# Patient Record
Sex: Female | Born: 1995
Health system: Southern US, Community
[De-identification: ages and names within clinical notes are randomized; demographics above are authoritative.]

## PROBLEM LIST (undated history)

## (undated) ENCOUNTER — Emergency Department (HOSPITAL_COMMUNITY): Payer: Medicaid Other

## (undated) DIAGNOSIS — F909 Attention-deficit hyperactivity disorder, unspecified type: Secondary | ICD-10-CM

## (undated) DIAGNOSIS — F419 Anxiety disorder, unspecified: Secondary | ICD-10-CM

## (undated) DIAGNOSIS — F329 Major depressive disorder, single episode, unspecified: Secondary | ICD-10-CM

## (undated) DIAGNOSIS — A6000 Herpesviral infection of urogenital system, unspecified: Secondary | ICD-10-CM

## (undated) DIAGNOSIS — F32A Depression, unspecified: Secondary | ICD-10-CM

## (undated) DIAGNOSIS — N200 Calculus of kidney: Principal | ICD-10-CM

## (undated) DIAGNOSIS — M1990 Unspecified osteoarthritis, unspecified site: Secondary | ICD-10-CM

## (undated) DIAGNOSIS — Z23 Encounter for immunization: Secondary | ICD-10-CM

## (undated) HISTORY — DX: Unspecified osteoarthritis, unspecified site: M19.90

## (undated) HISTORY — DX: Herpesviral infection of urogenital system, unspecified: A60.00

## (undated) HISTORY — DX: Encounter for immunization: Z23

## (undated) HISTORY — DX: Anxiety disorder, unspecified: F41.9

## (undated) HISTORY — DX: Major depressive disorder, single episode, unspecified: F32.9

## (undated) HISTORY — DX: Calculus of kidney: N20.0

## (undated) HISTORY — DX: Attention-deficit hyperactivity disorder, unspecified type: F90.9

## (undated) HISTORY — DX: Depression, unspecified: F32.A

---
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
## 2017-04-13 ENCOUNTER — Telehealth: Payer: Self-pay

## 2017-04-13 NOTE — Telephone Encounter (Signed)
Pt called triage line stating she has been having itching and burning x 1 week. I advised patient she would need to be seen for her S&S. She states she called and spoke to the receptionist, prior to leaving a triage message, who told her we didn't have an openings today.I explained to her we could not just call in a medication without her being seen. I advised her to go to Urgent care or her PCP for treatment.

## 2017-05-18 ENCOUNTER — Other Ambulatory Visit: Payer: Self-pay | Admitting: Obstetrics and Gynecology

## 2017-07-22 ENCOUNTER — Other Ambulatory Visit: Payer: Self-pay | Admitting: Obstetrics and Gynecology

## 2017-09-12 ENCOUNTER — Other Ambulatory Visit: Payer: Self-pay | Admitting: Obstetrics and Gynecology

## 2017-10-10 ENCOUNTER — Telehealth: Payer: Self-pay | Admitting: Obstetrics and Gynecology

## 2017-10-10 ENCOUNTER — Other Ambulatory Visit: Payer: Self-pay

## 2017-10-10 ENCOUNTER — Other Ambulatory Visit: Payer: Self-pay | Admitting: Obstetrics and Gynecology

## 2017-10-10 MED ORDER — NORGESTIMATE-ETH ESTRADIOL 0.25-35 MG-MCG PO TABS: 1.0000 | ORAL_TABLET | Freq: Every day | ORAL | 0 refills | Status: DC

## 2017-10-10 NOTE — Telephone Encounter (Signed)
Pt is calling needing an refill on her birthcontrol. Pt is schedule 11/20/17 for annual

## 2017-10-31 ENCOUNTER — Ambulatory Visit: Payer: Managed Care, Other (non HMO) | Admitting: Primary Care

## 2017-10-31 ENCOUNTER — Encounter: Payer: Self-pay | Admitting: Primary Care

## 2017-10-31 VITALS — BP 108/60 | HR 75 | Temp 98.2°F | Ht 66.0 in | Wt 137.1 lb

## 2017-10-31 DIAGNOSIS — F329 Major depressive disorder, single episode, unspecified: Secondary | ICD-10-CM

## 2017-10-31 DIAGNOSIS — F419 Anxiety disorder, unspecified: Secondary | ICD-10-CM | POA: Diagnosis not present

## 2017-10-31 MED ORDER — HYDROXYZINE HCL 10 MG PO TABS: 10.0000 mg | ORAL_TABLET | Freq: Two times a day (BID) | ORAL | 0 refills | Status: DC | PRN

## 2017-10-31 MED ORDER — FLUOXETINE HCL 10 MG PO CAPS: 10.0000 mg | ORAL_CAPSULE | Freq: Every day | ORAL | 1 refills | Status: DC

## 2017-10-31 NOTE — Patient Instructions (Signed)
Start fluoxetine 10 mg capsules for anxiety and depression. Take 1 capsule by mouth once daily.   You may take hydroxyzine 10 mg up to twice daily as needed for panic attacks. Use sparingly.  You will be contacted regarding your referral to therapy.  Please let us know if you have not heard back within one week.   Schedule a follow up visit in 6 weeks.   It was a pleasure to see you today!

## 2017-10-31 NOTE — Progress Notes (Signed)
Subjective:    Patient ID: Judy Miller, female    DOB: 1996-11-03, 21 y.o.   MRN: [id number]  HPI  Judy Miller is a 21 year old female with a history of anxiety, depression, ADHD who presents today with a chief complaint of anxiety and depression.  Symptoms include irritability, mood swings, worry with difficulty controlling worry, panic attacks, sadness. Panic attacks occur multiple times weekly, she will hyperventilate and cry on the floor during attacks. Symptoms have been present since age 21, but worse and uncontrollable for the past one year.   GAD 7 score of 19 and PHQ 9 score of 9 today. Previously managed on several different medications at different times but doesn't remember names. She denies SI/HI.   Review of Systems  Constitutional: Negative for fatigue.  Cardiovascular: Positive for palpitations.  Psychiatric/Behavioral: Positive for sleep disturbance. The patient is nervous/anxious.        See HPI       Past Medical History:  Diagnosis Date  . ADHD (attention deficit hyperactivity disorder)   . Anxiety   . Arthritis    history of this in her knees  . Depression      Social History   Socioeconomic History  . Marital status: Single    Spouse name: Not on file  . Number of children: Not on file  . Years of education: Not on file  . Highest education level: Not on file  Social Needs  . Financial resource strain: Not on file  . Food insecurity - worry: Not on file  . Food insecurity - inability: Not on file  . Transportation needs - medical: Not on file  . Transportation needs - non-medical: Not on file  Occupational History  . Not on file  Tobacco Use  . Smoking status: Current Every Day Smoker    Types: E-cigarettes  . Smokeless tobacco: Never Used  Substance and Sexual Activity  . Alcohol use: Yes    Alcohol/week: 0.0 oz    Comment: barely  . Drug use: No    Comment: history of   . Sexual activity: No  Other Topics Concern  . Not on  file  Social History Narrative   Single.   Graduated from HS in 2017.    Student in cosmetology school.   Enjoys spending time outdoors, playing with her dog.    No past surgical history on file.  Family History  Problem Relation Age of Onset  . Arthritis Mother   . Varicose Veins Maternal Grandmother   . Cancer Paternal Grandmother   . Varicose Veins Paternal Grandmother     Allergies  Allergen Reactions  . Cod [employer][Fish Allergy] Hives    Grouper fish  . Other Hives    Red hair dye  . Shellfish Allergy     Crab legs    Current Outpatient Medications on File Prior to Visit  Medication Sig Dispense Refill  . norgestimate-ethinyl estradiol (SPRINTEC 28) 0.25-35 MG-MCG tablet Take 1 tablet daily by mouth. 28 tablet 0   No current facility-administered medications on file prior to visit.     BP 108/60   Pulse 75   Temp 98.2 F (36.8 C) (Oral)   Ht 5\' 6"  (1.676 m)   Wt 137 lb 1.9 oz (62.2 kg)   LMP 10/31/2017   SpO2 99%   BMI 22.13 kg/m     Objective:   Physical Exam  Constitutional: She appears well-nourished.  Neck: Neck supple.  Cardiovascular: Normal rate and  regular rhythm.  Pulmonary/Chest: Effort normal and breath sounds normal.  Skin: Skin is warm and dry.  Psychiatric: She has a normal mood and affect.          Assessment & Plan:

## 2017-10-31 NOTE — Assessment & Plan Note (Signed)
GAD 7 score of 19 and PHQ 9 score of 9 today.  Patient is ready to retry medication as she admits she never really gave it a chance in the past.  Referral placed to therapy as I believe she will benefit. Prescription for fluoxetine 10 mg capsule sent to pharmacy. Prescription for hydroxyzine sent to pharmacy to use as needed for panic attacks.  We will not be prescribing benzodiazepines in a 21 year old.  We discussed possible side effects of headache, GI upset, drowsiness, and SI/HI. If thoughts of SI/HI develop, we discussed to present to the emergency immediately. Patient verbalized understanding.   Follow-up in 6 weeks for reevaluation.

## 2017-11-03 DIAGNOSIS — A6000 Herpesviral infection of urogenital system, unspecified: Secondary | ICD-10-CM

## 2017-11-03 HISTORY — DX: Herpesviral infection of urogenital system, unspecified: A60.00

## 2017-11-09 ENCOUNTER — Telehealth: Payer: Self-pay

## 2017-11-09 ENCOUNTER — Other Ambulatory Visit: Payer: Self-pay

## 2017-11-09 DIAGNOSIS — Z30011 Encounter for initial prescription of contraceptive pills: Secondary | ICD-10-CM

## 2017-11-09 MED ORDER — NORGESTIMATE-ETH ESTRADIOL 0.25-35 MG-MCG PO TABS: 1.0000 | ORAL_TABLET | Freq: Every day | ORAL | 0 refills | Status: DC

## 2017-11-09 NOTE — Telephone Encounter (Signed)
Pt has apt for 11/20/17 but needs a refill of Tri-Sprintec. ZO#[phone]#[phone]

## 2017-11-20 ENCOUNTER — Encounter: Payer: Self-pay | Admitting: Obstetrics and Gynecology

## 2017-11-20 ENCOUNTER — Ambulatory Visit (INDEPENDENT_AMBULATORY_CARE_PROVIDER_SITE_OTHER): Payer: Managed Care, Other (non HMO) | Admitting: Obstetrics and Gynecology

## 2017-11-20 VITALS — BP 118/80 | HR 78 | Ht 66.0 in | Wt 136.0 lb

## 2017-11-20 DIAGNOSIS — Z01419 Encounter for gynecological examination (general) (routine) without abnormal findings: Secondary | ICD-10-CM

## 2017-11-20 DIAGNOSIS — Z3041 Encounter for surveillance of contraceptive pills: Secondary | ICD-10-CM

## 2017-11-20 DIAGNOSIS — Z113 Encounter for screening for infections with a predominantly sexual mode of transmission: Secondary | ICD-10-CM

## 2017-11-20 MED ORDER — NORGESTIMATE-ETH ESTRADIOL 0.25-35 MG-MCG PO TABS: 1.0000 | ORAL_TABLET | Freq: Every day | ORAL | 3 refills | Status: DC

## 2017-11-20 NOTE — Patient Instructions (Signed)
I value your feedback and entrusting us with your care. If you get a [facility] patient survey, I would appreciate you taking the time to let us know about your experience today. Thank you! 

## 2017-11-20 NOTE — Progress Notes (Signed)
PCP:  Doreene Nestlark, Katherine K, NP   Chief Complaint  Patient presents with  . Gynecologic Exam     HPI:      Judy Miller is a 21 y.o. G0P0000 who LMP was Patient's last menstrual period was 10/31/2017., presents today for her annual examination.  Her menses are regular every 28-30 days, lasting 4 days.  Dysmenorrhea none. She does not have intermenstrual bleeding.  Sex activity: not sexually active. Last active a few wks ago, but she and boyfriend just broke up. On OCPs.  Hx of STDs: none  There is no FH of breast cancer. There is no FH of ovarian cancer. The patient does do self-breast exams.  Tobacco use: vapes "a lot" Alcohol use: none No drug use.  Exercise: moderately active  She does get adequate calcium and Vitamin D in her diet.  Gardasil completed.  Past Medical History:  Diagnosis Date  . ADHD (attention deficit hyperactivity disorder)   . Anxiety   . Arthritis    history of this in her knees  . Depression   . Vaccine for human papilloma virus (HPV) types 6, 11, 16, and 18 administered     History reviewed. No pertinent surgical history.  Family History  Problem Relation Age of Onset  . Arthritis Mother   . Varicose Veins Maternal Grandmother   . Cancer Paternal Grandmother   . Varicose Veins Paternal Grandmother     Social History   Socioeconomic History  . Marital status: Single    Spouse name: Not on file  . Number of children: Not on file  . Years of education: Not on file  . Highest education level: Not on file  Social Needs  . Financial resource strain: Not on file  . Food insecurity - worry: Not on file  . Food insecurity - inability: Not on file  . Transportation needs - medical: Not on file  . Transportation needs - non-medical: Not on file  Occupational History  . Not on file  Tobacco Use  . Smoking status: Current Every Day Smoker    Types: E-cigarettes  . Smokeless tobacco: Never Used  Substance and Sexual Activity    . Alcohol use: Yes    Alcohol/week: 0.0 oz    Comment: barely  . Drug use: No    Comment: history of   . Sexual activity: No  Other Topics Concern  . Not on file  Social History Narrative   Single.   Graduated from HS in 2017.    Student in cosmetology school.   Enjoys spending time outdoors, playing with her dog.    Current Meds  Medication Sig  . FLUoxetine (PROZAC) 10 MG capsule Take 1 capsule (10 mg total) by mouth daily.  . norgestimate-ethinyl estradiol (SPRINTEC 28) 0.25-35 MG-MCG tablet Take 1 tablet by mouth daily.  . [DISCONTINUED] norgestimate-ethinyl estradiol (SPRINTEC 28) 0.25-35 MG-MCG tablet Take 1 tablet by mouth daily.     ROS:  Review of Systems  Constitutional: Negative for fatigue, fever and unexpected weight change.  Respiratory: Negative for cough, shortness of breath and wheezing.   Cardiovascular: Negative for chest pain, palpitations and leg swelling.  Gastrointestinal: Negative for blood in stool, constipation, diarrhea, nausea and vomiting.  Endocrine: Negative for cold intolerance, heat intolerance and polyuria.  Genitourinary: Negative for dyspareunia, dysuria, flank pain, frequency, genital sores, hematuria, menstrual problem, pelvic pain, urgency, vaginal bleeding, vaginal discharge and vaginal pain.  Musculoskeletal: Negative for back pain, joint swelling and myalgias.  Skin: Negative for rash.  Neurological: Negative for dizziness, syncope, light-headedness, numbness and headaches.  Hematological: Negative for adenopathy.  Psychiatric/Behavioral: Negative for agitation, confusion, sleep disturbance and suicidal ideas. The patient is not nervous/anxious.      Objective: BP 118/80   Pulse 78   Ht 5\' 6"  (1.676 m)   Wt 136 lb (61.7 kg)   LMP 10/31/2017   BMI 21.95 kg/m    Physical Exam  Constitutional: She is oriented to person, place, and time. She appears well-developed and well-nourished.  Genitourinary: Vagina normal and uterus  normal. There is no rash or tenderness on the right labia. There is no rash or tenderness on the left labia. No erythema or tenderness in the vagina. No vaginal discharge found. Right adnexum does not display mass and does not display tenderness. Left adnexum does not display mass and does not display tenderness. Cervix does not exhibit motion tenderness or polyp. Uterus is not enlarged or tender.  Neck: Normal range of motion. No thyromegaly present.  Cardiovascular: Normal rate, regular rhythm and normal heart sounds.  No murmur heard. Pulmonary/Chest: Effort normal and breath sounds normal. Right breast exhibits no mass, no nipple discharge, no skin change and no tenderness. Left breast exhibits no mass, no nipple discharge, no skin change and no tenderness.  Abdominal: Soft. There is no tenderness. There is no guarding.  Musculoskeletal: Normal range of motion.  Neurological: She is alert and oriented to person, place, and time. No cranial nerve deficit.  Psychiatric: She has a normal mood and affect. Her behavior is normal.  Vitals reviewed.   Assessment/Plan: Encounter for annual routine gynecological examination  Encounter for surveillance of contraceptive pills - OCP RF. - Plan: norgestimate-ethinyl estradiol (SPRINTEC 28) 0.25-35 MG-MCG tablet  Screening for STD (sexually transmitted disease) - Plan: Chlamydia/Gonococcus/Trichomonas, NAA  Meds ordered this encounter  Medications  . norgestimate-ethinyl estradiol (SPRINTEC 28) 0.25-35 MG-MCG tablet    Sig: Take 1 tablet by mouth daily.    Dispense:  84 tablet    Refill:  3    Please have patient contact our office for appointment for further refills. Thank you!             GYN counsel adequate intake of calcium and vitamin D, diet and exercise     F/U  Return in about 1 year (around 11/20/2018).  Montee Tallman B. Eusevio Schriver, PA-C 11/20/2017 1:47 PM

## 2017-11-22 LAB — CHLAMYDIA/GONOCOCCUS/TRICHOMONAS, NAA
Chlamydia by NAA: NEGATIVE
Gonococcus by NAA: NEGATIVE
Trich vag by NAA: NEGATIVE

## 2017-11-28 ENCOUNTER — Ambulatory Visit: Payer: Managed Care, Other (non HMO) | Admitting: Obstetrics and Gynecology

## 2017-11-28 ENCOUNTER — Encounter: Payer: Self-pay | Admitting: Obstetrics and Gynecology

## 2017-11-28 VITALS — BP 104/72 | Ht 66.0 in | Wt 132.0 lb

## 2017-11-28 DIAGNOSIS — R300 Dysuria: Secondary | ICD-10-CM

## 2017-11-28 DIAGNOSIS — N898 Other specified noninflammatory disorders of vagina: Secondary | ICD-10-CM | POA: Diagnosis not present

## 2017-11-28 LAB — POCT URINALYSIS DIPSTICK
Bilirubin, UA: NEGATIVE
GLUCOSE UA: NEGATIVE
KETONES UA: NEGATIVE
Nitrite, UA: NEGATIVE
SPEC GRAV UA: 1.025 (ref 1.010–1.025)
Urobilinogen, UA: 0.2 E.U./dL
pH, UA: 6 (ref 5.0–8.0)

## 2017-11-28 MED ORDER — VALACYCLOVIR HCL 1 G PO TABS
1000.0000 mg | ORAL_TABLET | Freq: Two times a day (BID) | ORAL | 0 refills | Status: AC
Start: 2017-11-28 — End: 2017-12-08

## 2017-11-28 NOTE — Patient Instructions (Signed)
I value your feedback and entrusting us with your care. If you get a [facility] patient survey, I would appreciate you taking the time to let us know about your experience today. Thank you! 

## 2017-11-28 NOTE — Progress Notes (Signed)
Chief Complaint  Patient presents with  . Urinary Tract Infection    vaginal irritation, dizziness    HPI:      [name] is a 21 y.o. G0P0000 who LMP was Patient's last menstrual period was 10/31/2017., presents today for vaginal irritation for the past wk. Pt notes vaginal pain, increased d/c without odor, fevers, dizziness, urinary frequency/urgency, dysuria. No LBP, belly pain. No meds to treat. Has to shower after urination due to pain with wiping. No hx of HSV. Broke up with boyfriend of 3 yrs recently. Had annual 11/20/17 and had neg gon/chlam testing.    Past Medical History:  Diagnosis Date  . ADHD (attention deficit hyperactivity disorder)   . Anxiety   . Arthritis    history of this in her knees  . Depression   . Vaccine for human papilloma virus (HPV) types 6, 11, 16, and 18 administered     History reviewed. No pertinent surgical history.  Family History  Problem Relation Age of Onset  . Arthritis Mother   . Varicose Veins Maternal Grandmother   . Cancer Paternal Grandmother   . Varicose Veins Paternal Grandmother     Social History   Socioeconomic History  . Marital status: Single    Spouse name: Not on file  . Number of children: Not on file  . Years of education: Not on file  . Highest education level: Not on file  Social Needs  . Financial resource strain: Not on file  . Food insecurity - worry: Not on file  . Food insecurity - inability: Not on file  . Transportation needs - medical: Not on file  . Transportation needs - non-medical: Not on file  Occupational History  . Not on file  Tobacco Use  . Smoking status: Current Every Day Smoker    Types: E-cigarettes  . Smokeless tobacco: Never Used  Substance and Sexual Activity  . Alcohol use: Yes    Alcohol/week: 0.0 oz    Comment: barely  . Drug use: No    Comment: history of   . Sexual activity: No  Other Topics Concern  . Not on file  Social History Narrative   Single.     Graduated from HS in 2017.    Student in cosmetology school.   Enjoys spending time outdoors, playing with her dog.     Current Outpatient Medications:  .  norgestimate-ethinyl estradiol (SPRINTEC 28) 0.25-35 MG-MCG tablet, Take 1 tablet by mouth daily., Disp: 84 tablet, Rfl: 3 .  FLUoxetine (PROZAC) 10 MG capsule, Take 1 capsule (10 mg total) by mouth daily. (Patient not taking: Reported on 11/28/2017), Disp: 30 capsule, Rfl: 1 .  hydrOXYzine (ATARAX/VISTARIL) 10 MG tablet, Take 1 tablet (10 mg total) by mouth 2 (two) times daily as needed for anxiety. (Patient not taking: Reported on 11/20/2017), Disp: 30 tablet, Rfl: 0 .  valACYclovir (VALTREX) 1000 MG tablet, Take 1 tablet (1,000 mg total) by mouth 2 (two) times daily for 10 days., Disp: 20 tablet, Rfl: 0   ROS:  Review of Systems  Constitutional: Positive for fever.  Gastrointestinal: Negative for blood in stool, constipation, diarrhea, nausea and vomiting.  Genitourinary: Positive for dysuria, frequency, genital sores, urgency and vaginal pain. Negative for dyspareunia, flank pain, hematuria, vaginal bleeding and vaginal discharge.  Musculoskeletal: Negative for back pain.  Skin: Negative for rash.     OBJECTIVE:   Vitals:  BP 104/72   Ht 5\' 6"  (1.676 m)   Wt 132  lb (59.9 kg)   LMP 10/31/2017   BMI 21.31 kg/m   Physical Exam  Constitutional: She is oriented to person, place, and time and well-developed, well-nourished, and in no distress. Vital signs are normal.  Genitourinary: Vulva exhibits lesion. Vulva exhibits no erythema, no exudate, no rash and no tenderness.  Genitourinary Comments: POST FOURCHETTE WITH PERINEAL AREA WITH ULCERATIVE LESION; PAINFUL TO TOUCH  Lymphadenopathy:       Right: No inguinal adenopathy present.       Left: No inguinal adenopathy present.  Neurological: She is oriented to person, place, and time.  Psychiatric: Memory, affect and judgment normal.  Vitals reviewed.  Results for  orders placed or performed in visit on 11/28/17 (from the past 24 hour(s))  POCT Urinalysis Dipstick     Status: Abnormal   Collection Time: 11/28/17 11:05 AM  Result Value Ref Range   Color, UA yellow    Clarity, UA clear    Glucose, UA negative    Bilirubin, UA negative    Ketones, UA negative    Spec Grav, UA 1.025 1.010 - 1.025   Blood, UA small    pH, UA 6.0 5.0 - 8.0   Protein, UA trace    Urobilinogen, UA 0.2 0.2 or 1.0 E.U./dL   Nitrite, UA negative    Leukocytes, UA Moderate (2+) (A) Negative   Appearance     Odor       Assessment/Plan: Vaginal lesion - Looks herpetic. Rx valtrex. One Swab culture. Will call pt wiht results. HSV etiology/tx/testing discussed.  - Plan: Other/Misc lab test, valACYclovir (VALTREX) 1000 MG tablet  Dysuria - Most likely due lesion. - Plan: POCT Urinalysis Dipstick    Meds ordered this encounter  Medications  . valACYclovir (VALTREX) 1000 MG tablet    Sig: Take 1 tablet (1,000 mg total) by mouth 2 (two) times daily for 10 days.    Dispense:  20 tablet    Refill:  0      Return if symptoms worsen or fail to improve.  Lyndal Reggio B. Mckenley Birenbaum, PA-C 11/28/2017 11:50 AM

## 2017-12-05 ENCOUNTER — Telehealth: Payer: Self-pay | Admitting: Obstetrics and Gynecology

## 2017-12-05 ENCOUNTER — Encounter: Payer: Self-pay | Admitting: Obstetrics and Gynecology

## 2017-12-05 DIAGNOSIS — A6004 Herpesviral vulvovaginitis: Secondary | ICD-10-CM

## 2017-12-05 MED ORDER — VALACYCLOVIR HCL 500 MG PO TABS: 500.0000 mg | ORAL_TABLET | Freq: Every day | ORAL | 11 refills | Status: DC

## 2017-12-05 NOTE — Telephone Encounter (Signed)
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 Lesion improving with valtrex tx. Discussed episodic vs daily Rx. Pt wants daily valtrex. Rx eRxd. Recommend partner does IgG testing. F/u prn.

## 2017-12-14 ENCOUNTER — Ambulatory Visit: Payer: Managed Care, Other (non HMO) | Admitting: Primary Care

## 2017-12-14 ENCOUNTER — Encounter: Payer: Self-pay | Admitting: Primary Care

## 2017-12-14 DIAGNOSIS — F329 Major depressive disorder, single episode, unspecified: Secondary | ICD-10-CM

## 2017-12-14 DIAGNOSIS — F419 Anxiety disorder, unspecified: Secondary | ICD-10-CM | POA: Diagnosis not present

## 2017-12-14 NOTE — Progress Notes (Signed)
Subjective:    Patient ID: Judy Miller, female    DOB: 10-16-1996, 22 y.o.   MRN: [id number]  HPI  [name] is a 22 year old female who presents today for follow up of anxiety and depression.   She last presented on 10/31/17 with complaints of irritability, mood swings, difficulty controlling worry, panic attacks. She's had symptoms since the age of 22 but during her last visit symptoms had progressed. GAD 7 score of 19 and PHQ 9 score of 9 that day so she was initiated on fluoxetine 10 mg and referred to therapy for further treatment.  Since her last visit she's not yet been connected with therapy. She was contacted but forgot to call back to schedule. She's noticed feeling more calm, experiences less panic attacks, slightly less irritability. She denies GI upset, SI/HI. She has noticed feeling drowsy and is taking her medication in the morning. She's not had to take the hydroxyzine within the past one week.   Review of Systems  Respiratory: Negative for shortness of breath.   Cardiovascular: Negative for chest pain.  Gastrointestinal: Negative for abdominal pain.  Psychiatric/Behavioral: Negative for suicidal ideas.       Anxiety improved, see HPI       Past Medical History:  Diagnosis Date  . ADHD (attention deficit hyperactivity disorder)   . Anxiety   . Arthritis    history of this in her knees  . Depression   . Genital herpes 11/2017   pos HSV 2 culture  . Vaccine for human papilloma virus (HPV) types 6, 11, 16, and 18 administered      Social History   Socioeconomic History  . Marital status: Single    Spouse name: Not on file  . Number of children: Not on file  . Years of education: Not on file  . Highest education level: Not on file  Social Needs  . Financial resource strain: Not on file  . Food insecurity - worry: Not on file  . Food insecurity - inability: Not on file  . Transportation needs - medical: Not on file  . Transportation needs -  non-medical: Not on file  Occupational History  . Not on file  Tobacco Use  . Smoking status: Current Every Day Smoker    Types: E-cigarettes  . Smokeless tobacco: Never Used  Substance and Sexual Activity  . Alcohol use: Yes    Alcohol/week: 0.0 oz    Comment: barely  . Drug use: No    Comment: history of   . Sexual activity: No  Other Topics Concern  . Not on file  Social History Narrative   Single.   Graduated from HS in 2017.    Student in cosmetology school.   Enjoys spending time outdoors, playing with her dog.    No past surgical history on file.  Family History  Problem Relation Age of Onset  . Arthritis Mother   . Varicose Veins Maternal Grandmother   . Cancer Paternal Grandmother   . Varicose Veins Paternal Grandmother     Allergies  Allergen Reactions  . Cod [employer][Fish Allergy] Hives    Grouper fish  . Fish-Derived Products Hives    Grouper fish  . Other Hives and Other (See Comments)    Red hair dye Crab legs Grouper fish Red dye-hives/seafood-hives,burning in throat Red hair dye  . Shellfish Allergy Other (See Comments)    Crab legs Crab legs    Current Outpatient Medications on File Prior to  Visit  Medication Sig Dispense Refill  . FLUoxetine (PROZAC) 10 MG capsule Take 1 capsule (10 mg total) by mouth daily. 30 capsule 1  . hydrOXYzine (ATARAX/VISTARIL) 10 MG tablet Take 1 tablet (10 mg total) by mouth 2 (two) times daily as needed for anxiety. 30 tablet 0  . norgestimate-ethinyl estradiol (SPRINTEC 28) 0.25-35 MG-MCG tablet Take 1 tablet by mouth daily. 84 tablet 3  . valACYclovir (VALTREX) 500 MG tablet Take 1 tablet (500 mg total) by mouth daily. 30 tablet 11   No current facility-administered medications on file prior to visit.     BP 100/66   Pulse 80   Temp 97.8 F (36.6 C) (Oral)   Ht 5\' 6"  (1.676 m)   Wt 132 lb 6.4 oz (60.1 kg)   SpO2 99%   BMI 21.37 kg/m    Objective:   Physical Exam  Constitutional: She appears  well-nourished.  Neck: Neck supple.  Cardiovascular: Normal rate and regular rhythm.  Pulmonary/Chest: Effort normal and breath sounds normal.  Skin: Skin is warm and dry.  Psychiatric: She has a normal mood and affect.          Assessment & Plan:

## 2017-12-14 NOTE — Assessment & Plan Note (Signed)
Improved on Fluoxetine 10 mg, continue same. Using hydroxyzine much less frequent now. Discussed drowsiness precautions with hydroxyzine. Will have her switch fluoxetine to PM administration if no improvement in drowsiness.   She will update if symptoms become worse.

## 2017-12-14 NOTE — Patient Instructions (Signed)
Continue fluoxetine 10 mg capsules once daily for anxiety.  Use the hydroxyzine as needed for panic attacks.  Please connect with the therapist as discussed.  It was a pleasure to see you today!

## 2017-12-24 ENCOUNTER — Other Ambulatory Visit: Payer: Self-pay | Admitting: Primary Care

## 2017-12-24 DIAGNOSIS — F419 Anxiety disorder, unspecified: Principal | ICD-10-CM

## 2017-12-24 DIAGNOSIS — F329 Major depressive disorder, single episode, unspecified: Secondary | ICD-10-CM

## 2018-03-07 ENCOUNTER — Other Ambulatory Visit: Payer: Self-pay | Admitting: Primary Care

## 2018-03-07 DIAGNOSIS — F329 Major depressive disorder, single episode, unspecified: Secondary | ICD-10-CM

## 2018-03-07 DIAGNOSIS — F419 Anxiety disorder, unspecified: Principal | ICD-10-CM

## 2018-05-04 ENCOUNTER — Encounter: Payer: Self-pay | Admitting: Obstetrics and Gynecology

## 2018-05-06 ENCOUNTER — Other Ambulatory Visit: Payer: Self-pay

## 2018-05-06 DIAGNOSIS — A6004 Herpesviral vulvovaginitis: Secondary | ICD-10-CM

## 2018-05-06 MED ORDER — VALACYCLOVIR HCL 500 MG PO TABS: 500.0000 mg | ORAL_TABLET | Freq: Every day | ORAL | 8 refills | Status: DC

## 2018-05-21 ENCOUNTER — Other Ambulatory Visit: Payer: Self-pay

## 2018-05-21 ENCOUNTER — Encounter: Payer: Self-pay | Admitting: Obstetrics and Gynecology

## 2018-05-21 DIAGNOSIS — Z3041 Encounter for surveillance of contraceptive pills: Secondary | ICD-10-CM

## 2018-05-21 MED ORDER — NORGESTIMATE-ETH ESTRADIOL 0.25-35 MG-MCG PO TABS: 1.0000 | ORAL_TABLET | Freq: Every day | ORAL | 3 refills | Status: DC

## 2018-05-21 NOTE — Telephone Encounter (Signed)
Pls send Rx to new pharm. Thx

## 2018-12-08 ENCOUNTER — Other Ambulatory Visit: Payer: Self-pay | Admitting: Obstetrics and Gynecology

## 2018-12-08 DIAGNOSIS — A6004 Herpesviral vulvovaginitis: Secondary | ICD-10-CM

## 2019-01-15 ENCOUNTER — Other Ambulatory Visit: Payer: Self-pay

## 2019-01-15 ENCOUNTER — Emergency Department: Payer: Managed Care, Other (non HMO)

## 2019-01-15 ENCOUNTER — Emergency Department
Admission: EM | Admit: 2019-01-15 | Discharge: 2019-01-15 | Disposition: A | Discharge status: Home / Self Care | Payer: Managed Care, Other (non HMO) | Attending: Emergency Medicine | Admitting: Emergency Medicine

## 2019-01-15 DIAGNOSIS — F17290 Nicotine dependence, other tobacco product, uncomplicated: Secondary | ICD-10-CM | POA: Diagnosis not present

## 2019-01-15 DIAGNOSIS — N200 Calculus of kidney: Secondary | ICD-10-CM | POA: Diagnosis not present

## 2019-01-15 DIAGNOSIS — R1030 Lower abdominal pain, unspecified: Secondary | ICD-10-CM | POA: Diagnosis present

## 2019-01-15 DIAGNOSIS — Z79899 Other long term (current) drug therapy: Secondary | ICD-10-CM | POA: Insufficient documentation

## 2019-01-15 LAB — COMPREHENSIVE METABOLIC PANEL
ALT: 15 U/L (ref 0–44)
AST: 16 U/L (ref 15–41)
Albumin: 3.8 g/dL (ref 3.5–5.0)
Alkaline Phosphatase: 43 U/L (ref 38–126)
Anion gap: 7 (ref 5–15)
BUN: 13 mg/dL (ref 6–20)
CHLORIDE: 105 mmol/L (ref 98–111)
CO2: 25 mmol/L (ref 22–32)
Calcium: 9 mg/dL (ref 8.9–10.3)
Creatinine, Ser: 0.68 mg/dL (ref 0.44–1.00)
GFR calc Af Amer: >60 mL/min (ref >60)
GFR calc non Af Amer: >60 mL/min (ref >60)
Glucose, Bld: 125 mg/dL — ABNORMAL HIGH (ref 70–99)
Potassium: 3.7 mmol/L (ref 3.5–5.1)
Sodium: 137 mmol/L (ref 135–145)
Total Bilirubin: 0.6 mg/dL (ref 0.3–1.2)
Total Protein: 7.3 g/dL (ref 6.5–8.1)

## 2019-01-15 LAB — LIPASE, BLOOD: LIPASE: 29 U/L (ref 11–51)

## 2019-01-15 LAB — URINALYSIS, COMPLETE (UACMP) WITH MICROSCOPIC
GLUCOSE, UA: NEGATIVE mg/dL
KETONES UR: 20 mg/dL — AB
Leukocytes,Ua: NEGATIVE
NITRITE: NEGATIVE
PH: 5 (ref 5.0–8.0)
PROTEIN: NEGATIVE mg/dL
Specific Gravity, Urine: 1.025 (ref 1.005–1.030)

## 2019-01-15 LAB — CBC
HCT: 39.3 % (ref 36.0–46.0)
Hemoglobin: 12.9 g/dL (ref 12.0–15.0)
MCH: 28.6 pg (ref 26.0–34.0)
MCHC: 32.8 g/dL (ref 30.0–36.0)
MCV: 87.1 fL (ref 80.0–100.0)
PLATELETS: 308 10*3/uL (ref 150–400)
RBC: 4.51 MIL/uL (ref 3.87–5.11)
RDW: 12.3 % (ref 11.5–15.5)
WBC: 16.8 10*3/uL — AB (ref 4.0–10.5)
nRBC: 0 % (ref 0.0–0.2)

## 2019-01-15 LAB — POCT PREGNANCY, URINE: Preg Test, Ur: NEGATIVE

## 2019-01-15 MED ORDER — TAMSULOSIN HCL 0.4 MG PO CAPS: 0.4000 mg | ORAL_CAPSULE | Freq: Every day | ORAL | 0 refills | Status: DC

## 2019-01-15 MED ORDER — KETOROLAC TROMETHAMINE 10 MG PO TABS: 10.0000 mg | ORAL_TABLET | Freq: Four times a day (QID) | ORAL | 0 refills | Status: DC | PRN

## 2019-01-15 MED ORDER — ONDANSETRON 4 MG PO TBDP: 4.0000 mg | ORAL_TABLET | Freq: Three times a day (TID) | ORAL | 0 refills | Status: DC | PRN

## 2019-01-15 MED ORDER — KETOROLAC TROMETHAMINE 30 MG/ML IJ SOLN
30.0000 mg | Freq: Once | INTRAMUSCULAR | Status: AC
  Administered 2019-01-15: 30 mg via INTRAVENOUS
  Filled 2019-01-15: qty 1

## 2019-01-15 MED ORDER — FENTANYL CITRATE (PF) 100 MCG/2ML IJ SOLN
50.0000 ug | Freq: Once | INTRAMUSCULAR | Status: AC
  Administered 2019-01-15: 50 ug via INTRAVENOUS
  Filled 2019-01-15: qty 2

## 2019-01-15 MED ORDER — ONDANSETRON HCL 4 MG/2ML IJ SOLN
4.0000 mg | Freq: Once | INTRAMUSCULAR | Status: AC
  Administered 2019-01-15: 4 mg via INTRAVENOUS
  Filled 2019-01-15: qty 2

## 2019-01-15 MED ORDER — SODIUM CHLORIDE 0.9 % IV BOLUS
1000.0000 mL | Freq: Once | INTRAVENOUS | Status: AC
  Administered 2019-01-15: 1000 mL via INTRAVENOUS

## 2019-01-15 NOTE — ED Triage Notes (Signed)
Patient ambulatory to triage with steady gait, without difficulty or distress noted; pt reports awoke at 3am with lower back/abd pain accomp by urinary frequency

## 2019-01-15 NOTE — ED Provider Notes (Signed)
[facility] Emergency Department Provider Note  Time seen: 7:22 AM  I have reviewed the triage vital signs and the nursing notes.   HISTORY  Chief Complaint Back Pain    HPI Judy Miller is a 23 y.o. female with past medical history of ADHD, anxiety, presents to the emergency department for lower abdominal and lower back pain.  According to the patient she was awoken overnight with significant lower back pain as well as suprapubic pain, urinary frequency and dysuria.  No history of UTIs per patient.  Family history of kidney stones but no personal history of kidney stones.  Denies any known fever.  Denies any increased vaginal discharge.  Last menstrual period was 2 to 3 weeks ago.  Patient does feel somewhat nauseated as well as dizzy, mild diaphoresis at this time.   Past Medical History:  Diagnosis Date  . ADHD (attention deficit hyperactivity disorder)   . Anxiety   . Arthritis    history of this in her knees  . Depression   . Genital herpes 11/2017   pos HSV 2 culture  . Vaccine for human papilloma virus (HPV) types 6, 11, 16, and 18 administered     Patient Active Problem List   Diagnosis Date Noted  . Annual physical exam 11/10/2015  . Rhinitis, allergic 11/10/2015  . Acne vulgaris 05/12/2015  . Anxiety and depression 05/12/2015  . Generalized anxiety disorder 11/06/2013  . Depression, major, single episode, mild (HCC) 04/23/2013  . ADHD (attention deficit hyperactivity disorder)     No past surgical history on file.  Prior to Admission medications   Medication Sig Start Date End Date Taking? Authorizing Provider  FLUoxetine (PROZAC) 10 MG capsule TAKE 1 CAPSULE BY MOUTH EVERY DAY 03/10/18   Doreene Nest, NP  hydrOXYzine (ATARAX/VISTARIL) 10 MG tablet Take 1 tablet (10 mg total) by mouth 2 (two) times daily as needed for anxiety. 10/31/17   Doreene Nest, NP  norgestimate-ethinyl estradiol (SPRINTEC 28) 0.25-35 MG-MCG  tablet Take 1 tablet by mouth daily. 05/21/18   Copland, Ilona Sorrel, PA-C  valACYclovir (VALTREX) 500 MG tablet Take 1 tablet (500 mg total) by mouth daily. 05/06/18   Copland, Ilona Sorrel, PA-C    Allergies  Allergen Reactions  . Cod [employer] Allergy] Hives    Grouper fish  . Fish-Derived Products Hives    Grouper fish  . Other Hives and Other (See Comments)    Red hair dye Crab legs Grouper fish Red dye-hives/seafood-hives,burning in throat Red hair dye  . Shellfish Allergy Other (See Comments)    Crab legs Crab legs    Family History  Problem Relation Age of Onset  . Arthritis Mother   . Varicose Veins Maternal Grandmother   . Cancer Paternal Grandmother   . Varicose Veins Paternal Grandmother     Social History Social History   Tobacco Use  . Smoking status: Current Every Day Smoker    Types: E-cigarettes  . Smokeless tobacco: Never Used  Substance Use Topics  . Alcohol use: Yes    Alcohol/week: 0.0 standard drinks    Comment: barely  . Drug use: No    Comment: history of     Review of Systems Constitutional: No known fever Cardiovascular: Negative for chest pain. Respiratory: Negative for shortness of breath. Gastrointestinal: Positive for lower abdominal pain.  Positive for lower back pain.  Positive for nausea.  Negative for vomiting or diarrhea. Genitourinary: Positive for dysuria and frequency.  Negative for vaginal  bleeding.  Denies any increased discharge. Musculoskeletal: Negative for musculoskeletal complaints Skin: Negative for skin complaints  Neurological: Negative for headache All other ROS negative  ____________________________________________   PHYSICAL EXAM:  VITAL SIGNS: ED Triage Vitals  Enc Vitals Group     BP 01/15/19 0650 (!) 142/92     Pulse Rate 01/15/19 0650 71     Resp 01/15/19 0650 18     Temp 01/15/19 0650 98 F (36.7 C)     Temp Source 01/15/19 0650 Oral     SpO2 01/15/19 0650 98 %     Weight 01/15/19 0649 135 lb (61.2 kg)      Height 01/15/19 0649 5\' 6"  (1.676 m)     Head Circumference --      Peak Flow --      Pain Score 01/15/19 0649 10     Pain Loc --      Pain Edu? --      Excl. in GC? --    Constitutional: Alert and oriented. Well appearing and in no distress. Eyes: Normal exam ENT   Head: Normocephalic and atraumatic.   Mouth/Throat: Mucous membranes are moist. Cardiovascular: Normal rate, regular rhythm. No murmur Respiratory: Normal respiratory effort without tachypnea nor retractions. Breath sounds are clear Gastrointestinal: Soft and nontender. No distention.   Musculoskeletal: Nontender with normal range of motion in all extremities.  Neurologic:  Normal speech and language. No gross focal neurologic deficits  Skin:  Skin is warm, dry and intact.  Psychiatric: Mood and affect are normal.   ____________________________________________   RADIOLOGY  IMPRESSION: 1. 2 mm calculus distal right ureter with moderate hydronephrosis on the right. Right kidney is edematous.  2. Slight nephrocalcinosis.  3. No evident bowel obstruction. No abscess in the abdomen or pelvis. Appendix region appears normal.  ____________________________________________   INITIAL IMPRESSION / ASSESSMENT AND PLAN / ED COURSE  Pertinent labs & imaging results that were available during my care of the patient were reviewed by me and considered in my medical decision making (see chart for details).  Patient presents to the emergency department for lower abdominal and lower back pain.  Differential at this time include urinary tract infection, pyelonephritis, PID, intra-abdominal infection such as appendicitis.  Patient describes her pain is 10/10 aching pain across the lower back and abdomen.  Given her discomfort we will also check lab work, treat her pain and nausea, IV hydrate while awaiting results.  Urine pregnancy test is negative remainder the lab work including urinalysis is pending.  Patient's labs have  resulted showing hematuria and moderate leukocytosis otherwise negative.  Patient's CT scan shows a 2 mm distal right ureteral stone likely the cause of the patient's discomfort.  Patient feels much better after fentanyl and Toradol.  We will discharge with Toradol, Flomax and urology follow-up.  I discussed my normal kidney stone return precautions. ____________________________________________   FINAL CLINICAL IMPRESSION(S) / ED DIAGNOSES  Abdominal pain Lower back pain Kidney stone   Minna AntisPaduchowski, Brynlie Daza, MD 01/15/19 [phone]

## 2019-01-17 DIAGNOSIS — N200 Calculus of kidney: Secondary | ICD-10-CM

## 2019-01-17 LAB — URINE CULTURE

## 2019-01-17 MED ORDER — TRAMADOL HCL 50 MG PO TABS: 50.0000 mg | ORAL_TABLET | Freq: Three times a day (TID) | ORAL | 0 refills | Status: DC | PRN

## 2019-01-21 ENCOUNTER — Encounter: Payer: Self-pay | Admitting: Primary Care

## 2019-01-21 ENCOUNTER — Ambulatory Visit (INDEPENDENT_AMBULATORY_CARE_PROVIDER_SITE_OTHER): Payer: Managed Care, Other (non HMO) | Admitting: Primary Care

## 2019-01-21 VITALS — BP 122/76 | HR 78 | Temp 98.4°F | Ht 66.0 in | Wt 125.2 lb

## 2019-01-21 DIAGNOSIS — F909 Attention-deficit hyperactivity disorder, unspecified type: Secondary | ICD-10-CM

## 2019-01-21 DIAGNOSIS — N200 Calculus of kidney: Secondary | ICD-10-CM | POA: Diagnosis not present

## 2019-01-21 DIAGNOSIS — Z79899 Other long term (current) drug therapy: Secondary | ICD-10-CM

## 2019-01-21 HISTORY — DX: Calculus of kidney: N20.0

## 2019-01-21 LAB — POC URINALSYSI DIPSTICK (AUTOMATED)
Bilirubin, UA: NEGATIVE
Glucose, UA: NEGATIVE
Ketones, UA: NEGATIVE
Leukocytes, UA: NEGATIVE
Nitrite, UA: NEGATIVE
Protein, UA: POSITIVE — AB
Spec Grav, UA: 1.015 (ref 1.010–1.025)
Urobilinogen, UA: 0.2 E.U./dL
pH, UA: 6 (ref 5.0–8.0)

## 2019-01-21 LAB — BASIC METABOLIC PANEL
BUN: 13 mg/dL (ref 6–23)
CO2: 29 mEq/L (ref 19–32)
Calcium: 9.2 mg/dL (ref 8.4–10.5)
Chloride: 98 mEq/L (ref 96–112)
Creatinine, Ser: 0.75 mg/dL (ref 0.40–1.20)
GFR: 97.72 mL/min (ref >60.00)
GLUCOSE: 78 mg/dL (ref 70–99)
Potassium: 3.6 mEq/L (ref 3.5–5.1)
Sodium: 135 mEq/L (ref 135–145)

## 2019-01-21 LAB — CBC
HCT: 37.2 % (ref 36.0–46.0)
Hemoglobin: 12.3 g/dL (ref 12.0–15.0)
MCHC: 33 g/dL (ref 30.0–36.0)
MCV: 86.4 fl (ref 78.0–100.0)
Platelets: 279 10*3/uL (ref 150.0–400.0)
RBC: 4.31 Mil/uL (ref 3.87–5.11)
RDW: 12.8 % (ref 11.5–14.6)
WBC: 8.3 10*3/uL (ref 4.5–10.5)

## 2019-01-21 MED ORDER — LISDEXAMFETAMINE DIMESYLATE 20 MG PO CAPS: 20.0000 mg | ORAL_CAPSULE | Freq: Every day | ORAL | 0 refills | Status: DC

## 2019-01-21 NOTE — Assessment & Plan Note (Signed)
Deteriorated. Reviewed records.  Agree to resume Vyvanse for short period of time until she's finished with school. UDS will be due in one month.  Controlled substance contract updated today. She will update.

## 2019-01-21 NOTE — Patient Instructions (Signed)
Continue taking the tamsulosin (Flomax) daily to help facilitation of the kidney stone.  Use the Tramadol as needed, you can double up on the dose once daily as discussed.  We've resumed your Vyvanse 20 mg. You will need to return anytime next month (before your refill is due) for a urine drug screen.   Continue to drink plenty of water, update me as discussed.  Stop by the lab prior to leaving today. I will notify you of your results once received.   It was a pleasure to see you today!

## 2019-01-21 NOTE — Assessment & Plan Note (Signed)
Right 2 mm ureteral stone, diagnosed on 01/15/19. Continue Flomax and Tramadol. Discussed to use the strainer daily, return stone if possible. She will update if symptoms persist.  All hospital notes, labs imaging reviewed.

## 2019-01-21 NOTE — Progress Notes (Signed)
Subjective:    Patient ID: Judy Miller, female    DOB: 26-Mar-1996, 23 y.o.   MRN: [id number]  HPI  Judy Miller is a 23 year old female who presents today for emergency department follow up. She would also like to discuss her ADHD.  1) Emergency Department Follow up/Renal Stone:  She was evaluated on 01/15/19 at [facility] ED with a chief complaint of lower abdominal, suprapubic, and back pain that began several hours prior. UA with moderate leukocytosis and hematuria. She underwent CT abdomen/pelvic which revealed a 2 mm calculus to the distal right ureter with moderate hydronephrosis on the right. Right kidney was edematous. She was treated with IV Fentanyl and Toradol and improved. She was discharged home with prescriptions for Flomax and Toradol and was told to follow up with Urology.  Since her last visit she's continued to experience pain. She sent a message through the online portal on 01/17/19 with reports of continued pain. She endorsed compliance to her prescribed medications. She was provided with a short term course of Tramadol to help with discomfort.  Today she's feeling slightly better. She does have some achy sensation to her right side with radiation to right suprapubic region. She doesn't feel as though her pain has moved. She did have some nausea with vomiting with her last episode being two days ago. She endorses drinking plenty of water, also taking tamsulosin daily. She sometimes uses the strainer. She's had no relief from Tramadol, she is taking 50 mg three times daily, also Toradol.   She is needing work/school notes for being out. She missed 02/14, 02/16, and today due to an appointment.   2) ADHD: She would also like to discuss her lack of focus. Prior history of ADHD and was once managed on Vyvanse 20 mg. She was able to come off after high school and has been doing well since except for recently. She is working two jobs and going to school part time. She's having  a hard time focusing and completing school work. She often forgets to do things at work. She will be done with school in 3-4 months and would like to resume her Vyvanse until that time. She did very well on the 20 mg dose.   Review of Systems  Constitutional: Negative for fever.  Gastrointestinal: Positive for abdominal pain. Negative for nausea and vomiting.  Genitourinary: Positive for flank pain and pelvic pain. Negative for dysuria, frequency and hematuria.  Psychiatric/Behavioral: Positive for decreased concentration.       See HPI       Past Medical History:  Diagnosis Date  . ADHD (attention deficit hyperactivity disorder)   . Anxiety   . Arthritis    history of this in her knees  . Depression   . Genital herpes 11/2017   pos HSV 2 culture  . Vaccine for human papilloma virus (HPV) types 6, 11, 16, and 18 administered      Social History   Socioeconomic History  . Marital status: Single    Spouse name: Not on file  . Number of children: Not on file  . Years of education: Not on file  . Highest education level: Not on file  Occupational History  . Not on file  Social Needs  . Financial resource strain: Not on file  . Food insecurity:    Worry: Not on file    Inability: Not on file  . Transportation needs:    Medical: Not on file  Non-medical: Not on file  Tobacco Use  . Smoking status: Current Every Day Smoker    Types: E-cigarettes  . Smokeless tobacco: Never Used  Substance and Sexual Activity  . Alcohol use: Yes    Alcohol/week: 0.0 standard drinks    Comment: barely  . Drug use: No    Comment: history of   . Sexual activity: Never  Lifestyle  . Physical activity:    Days per week: Not on file    Minutes per session: Not on file  . Stress: Not on file  Relationships  . Social connections:    Talks on phone: Not on file    Gets together: Not on file    Attends religious service: Not on file    Active member of club or organization: Not on file      Attends meetings of clubs or organizations: Not on file    Relationship status: Not on file  . Intimate partner violence:    Fear of current or ex partner: Not on file    Emotionally abused: Not on file    Physically abused: Not on file    Forced sexual activity: Not on file  Other Topics Concern  . Not on file  Social History Narrative   Single.   Graduated from HS in 2017.    Student in cosmetology school.   Enjoys spending time outdoors, playing with her dog.    No past surgical history on file.  Family History  Problem Relation Age of Onset  . Arthritis Mother   . Varicose Veins Maternal Grandmother   . Cancer Paternal Grandmother   . Varicose Veins Paternal Grandmother     Allergies  Allergen Reactions  . Cod [employer] Allergy] Hives    Grouper fish  . Fish-Derived Products Hives    Grouper fish  . Other Hives and Other (See Comments)    Red hair dye Crab legs Grouper fish Red dye-hives/seafood-hives,burning in throat Red hair dye  . Shellfish Allergy Other (See Comments)    Crab legs Crab legs    Current Outpatient Medications on File Prior to Visit  Medication Sig Dispense Refill  . FLUoxetine (PROZAC) 10 MG capsule TAKE 1 CAPSULE BY MOUTH EVERY DAY 90 capsule 1  . hydrOXYzine (ATARAX/VISTARIL) 10 MG tablet Take 1 tablet (10 mg total) by mouth 2 (two) times daily as needed for anxiety. 30 tablet 0  . ketorolac (TORADOL) 10 MG tablet Take 1 tablet (10 mg total) by mouth every 6 (six) hours as needed. 20 tablet 0  . norgestimate-ethinyl estradiol (SPRINTEC 28) 0.25-35 MG-MCG tablet Take 1 tablet by mouth daily. 84 tablet 3  . tamsulosin (FLOMAX) 0.4 MG CAPS capsule Take 1 capsule (0.4 mg total) by mouth daily. 30 capsule 0  . traMADol (ULTRAM) 50 MG tablet Take 1 tablet (50 mg total) by mouth every 8 (eight) hours as needed for severe pain. 15 tablet 0  . valACYclovir (VALTREX) 500 MG tablet Take 1 tablet (500 mg total) by mouth daily. 30 tablet 8  .  ondansetron (ZOFRAN ODT) 4 MG disintegrating tablet Take 1 tablet (4 mg total) by mouth every 8 (eight) hours as needed for nausea or vomiting. (Patient not taking: Reported on 01/21/2019) 20 tablet 0   No current facility-administered medications on file prior to visit.     BP 122/76   Pulse 78   Temp 98.4 F (36.9 C) (Oral)   Ht 5\' 6"  (1.676 m)   Wt 125 lb 4 oz (  56.8 kg)   LMP 01/01/2019 (Approximate)   SpO2 98%   BMI 20.22 kg/m    Objective:   Physical Exam  Constitutional: She appears well-nourished.  Cardiovascular: Normal rate and regular rhythm.  Respiratory: Effort normal and breath sounds normal.  GI: Soft. Normal appearance and bowel sounds are normal. There is abdominal tenderness in the right lower quadrant and suprapubic area. There is CVA tenderness.  Skin: Skin is warm and dry.  Psychiatric: She has a normal mood and affect.           Assessment & Plan:

## 2019-01-29 DIAGNOSIS — F909 Attention-deficit hyperactivity disorder, unspecified type: Secondary | ICD-10-CM

## 2019-01-29 MED ORDER — AMPHETAMINE-DEXTROAMPHET ER 10 MG PO CP24: 10.0000 mg | ORAL_CAPSULE | Freq: Every day | ORAL | 0 refills | Status: DC

## 2019-02-17 ENCOUNTER — Other Ambulatory Visit: Payer: Managed Care, Other (non HMO)

## 2019-07-12 ENCOUNTER — Other Ambulatory Visit: Payer: Self-pay | Admitting: Obstetrics and Gynecology

## 2019-07-12 DIAGNOSIS — Z3041 Encounter for surveillance of contraceptive pills: Secondary | ICD-10-CM

## 2019-10-06 ENCOUNTER — Other Ambulatory Visit: Payer: Self-pay | Admitting: Obstetrics and Gynecology

## 2019-10-06 ENCOUNTER — Telehealth: Payer: Managed Care, Other (non HMO) | Admitting: Family

## 2019-10-06 DIAGNOSIS — L700 Acne vulgaris: Secondary | ICD-10-CM | POA: Diagnosis not present

## 2019-10-06 DIAGNOSIS — Z3041 Encounter for surveillance of contraceptive pills: Secondary | ICD-10-CM

## 2019-10-06 MED ORDER — CLINDAMYCIN PHOS-BENZOYL PEROX 1-5 % EX GEL: Freq: Two times a day (BID) | CUTANEOUS | 0 refills | Status: DC

## 2019-10-06 NOTE — Progress Notes (Signed)
We are sorry that you are experiencing this issue.  Here is how we plan to help!  Based on what you shared with me it looks like you have uncomplicated acne.  Acne is a disorder of the hair follicles and oil glands (sebaceous glands). The sebaceous glands secrete oils to keep the skin moist.  When the glands get clogged, it can lead to pimples or cysts.  These cysts may become infected and leave scars. Acne is very common and normally occurs at puberty.  Acne is also inherited.  Your personal care plan consists of the following recommendations:  I recommend that you use a daily cleanser  You might try an over the counter cleanser that has benzoyl peroxide.  I recommend that you start with a product that has 2.5% benzoyl peroxide.  Stronger concentrations have not been shown to be more effective.  I have prescribed a topical gel with an antibiotic:  Clindamycin-benzoyl peroxide gel.  This gel should be applied to the affected areas twice a day.  Be sure to read the package insert to understand potential side effects.   If excessive dryness or peeling occurs, reduce dose frequency or concentration of the topical scrubs.  If excessive stinging or burning occurs, remove the topical gel with mild soap and water and resume at a lower dose the next day.  Remember oral antibiotics and topical acne treatments may increase your sensitivity to the sun!  HOME CARE:  Do not squeeze pimples because that can often lead to infections, worse acne, and scars.  Use a moisturizer that contains retinoid or fruit acids that may inhibit the development of new acne lesions.  Although there is not a clear link that foods can cause acne, doctors do believe that too many sweets predispose you to skin problems.  GET HELP RIGHT AWAY IF:  If your acne gets worse or is not better within 10 days.  If you become depressed.  If you become pregnant, discontinue medications and call your OB/GYN.  MAKE SURE  YOU:  Understand these instructions.  Will watch your condition.  Will get help right away if you are not doing well or get worse.   Your e-visit answers were reviewed by a board certified advanced clinical practitioner to complete your personal care plan.  Depending upon the condition, your plan could have included both over the counter or prescription medications.  Please review your pharmacy choice.  If there is a problem, you may contact your provider through MyChart messaging and have the prescription routed to another pharmacy.  Your safety is important to us.  If you have drug allergies check your prescription carefully.  For the next 24 hours you can use MyChart to ask questions about today's visit, request a non-urgent call back, or ask for a work or school excuse from your e-visit provider.  You will get an email in the next two days asking about your experience. I hope that your e-visit has been valuable and will speed your recovery.   Approximately 5 minutes was spent documenting and reviewing patient's chart.    

## 2020-09-28 DIAGNOSIS — Z20822 Contact with and (suspected) exposure to covid-19: Secondary | ICD-10-CM | POA: Diagnosis not present

## 2020-09-28 NOTE — Telephone Encounter (Signed)
[facility] Day - Client TELEPHONE ADVICE RECORD AccessNurse Patient Name: Judy Miller Gender: Female DOB: [birth year] Age: 24 Y 6 M Return Phone Number: [phone] (Primary) Address: City/State/Zip: [place] [address] Client [facility] Day - Client Client Site  [facility] - Day Physician Vernona Rieger - NP Contact Type Call Who Is Calling Patient / Member / Family / Caregiver Call Type Triage / Clinical Relationship To Patient Self Return Phone Number [phone] (Primary) Chief Complaint BREATHING - shortness of breath or sounds breathless Reason for Call Symptomatic / Request for Health Information Initial Comment Caller states she found black mold in a closet she does not use. She did clean the mold out and is now having chest pain and SOB. Translation No Nurse Assessment Nurse: Alexander Mt, RN, Nicholaus Bloom Date/Time (Eastern Time): 09/28/2020 10:15:51 AM Confirm and document reason for call. If symptomatic, describe symptoms. ---Caller states she found black mold in a closet she does not use. She did clean the closest out and is now having chest pain and SOB. States symptoms started yesterday and are still present today. Chest pain is in the middle of the chest and is achy and hurts with taking a deep breath. Does the patient have any new or worsening symptoms? ---Yes Will a triage be completed? ---Yes Related visit to physician within the last 2 weeks? ---No Does the PT have any chronic conditions? (i.e. diabetes, asthma, this includes High risk factors for pregnancy, etc.) ---No Is the patient pregnant or possibly pregnant? (Ask all females between the ages of 91-55) ---No Is this a behavioral health or substance abuse call? ---No Guidelines Guideline Title Affirmed Question Affirmed Notes Nurse Date/Time Lamount Cohen Time) Chest Pain Dizziness or lightheadedness Alexander Mt, Pauline Aus 09/28/2020  10:17:47 AM Disp. Time Lamount Cohen Time) Disposition Final User 09/28/2020 10:13:07 AM Send to Urgent Queue Gerhard Perches 09/28/2020 10:20:13 AM Go to ED Now Yes Alexander Mt, RN, Erasmo Score NOTE: All timestamps contained within this report are represented as [place] Standard Time. CONFIDENTIALTY NOTICE: This fax transmission is intended only for the addressee. It contains information that is legally privileged, confidential or otherwise protected from use or disclosure. If you are not the intended recipient, you are strictly prohibited from reviewing, disclosing, copying using or disseminating any of this information or taking any action in reliance on or regarding this information. If you have received this fax in error, please notify us immediately by telephone so that we can arrange for its return to [place]. Phone: [phone], Toll-Free: [phone], Fax: [phone] Page: 2 of 2 Call Id: [id number] Caller Disagree/Comply Comply Caller Understands Yes PreDisposition Did not know what to do Care Advice Given Per Guideline GO TO ED NOW: * You need to be seen in the Emergency Department. * Go to the ED at ___________ Hospital. * Leave now. Drive carefully. NOTE TO TRIAGER - DRIVING: * Another adult should drive. * If immediate transportation is not available via car or taxi, then the patient should be instructed to call [id number]. CALL EMS IF: * Severe difficulty breathing occurs * Passes out or becomes too weak to stand * You become worse CARE ADVICE given per Chest Pain (Adult) guideline. Referrals GO TO FACILITY UNDECIDED

## 2020-09-28 NOTE — Telephone Encounter (Signed)
Left v/m requesting cb to see if pt went to ED or UC as advised by access nurse.

## 2020-10-05 ENCOUNTER — Other Ambulatory Visit: Payer: Self-pay | Admitting: Primary Care

## 2020-10-05 ENCOUNTER — Other Ambulatory Visit: Payer: Self-pay

## 2020-10-05 ENCOUNTER — Encounter: Payer: Self-pay | Admitting: Primary Care

## 2020-10-05 ENCOUNTER — Ambulatory Visit: Payer: BC Managed Care – PPO | Admitting: Primary Care

## 2020-10-05 VITALS — BP 110/62 | HR 92 | Temp 98.1°F | Ht 66.0 in | Wt 130.0 lb

## 2020-10-05 DIAGNOSIS — F411 Generalized anxiety disorder: Secondary | ICD-10-CM

## 2020-10-05 DIAGNOSIS — R071 Chest pain on breathing: Secondary | ICD-10-CM | POA: Diagnosis not present

## 2020-10-05 LAB — CBC WITH DIFFERENTIAL/PLATELET
Basophils Absolute: 0.1 10*3/uL (ref 0.0–0.1)
Basophils Relative: 0.8 % (ref 0.0–3.0)
Eosinophils Absolute: 0.1 10*3/uL (ref 0.0–0.7)
Eosinophils Relative: 1 % (ref 0.0–5.0)
HCT: 43.8 % (ref 36.0–46.0)
Hemoglobin: 14.6 g/dL (ref 12.0–15.0)
Lymphocytes Relative: 7.9 % — ABNORMAL LOW (ref 12.0–46.0)
Lymphs Abs: 1.1 10*3/uL (ref 0.7–4.0)
MCHC: 33.2 g/dL (ref 30.0–36.0)
MCV: 85.9 fl (ref 78.0–100.0)
Monocytes Absolute: 0.5 10*3/uL (ref 0.1–1.0)
Monocytes Relative: 3.4 % (ref 3.0–12.0)
Neutro Abs: 12.4 10*3/uL — ABNORMAL HIGH (ref 1.4–7.7)
Neutrophils Relative %: 86.9 % — ABNORMAL HIGH (ref 43.0–77.0)
Platelets: 299 10*3/uL (ref 150.0–400.0)
RBC: 5.1 Mil/uL (ref 3.87–5.11)
RDW: 13.4 % (ref 11.5–15.5)
WBC: 14.3 10*3/uL — ABNORMAL HIGH (ref 4.0–10.5)

## 2020-10-05 MED ORDER — ESCITALOPRAM OXALATE 10 MG PO TABS: 10.0000 mg | ORAL_TABLET | Freq: Every day | ORAL | 1 refills | Status: DC

## 2020-10-05 NOTE — Progress Notes (Signed)
Subjective:    Patient ID: Judy Miller, female    DOB: 1996/08/12, 24 y.o.   MRN: [id number]  HPI  This visit occurred during the SARS-CoV-2 public health emergency.  Safety protocols were in place, including screening questions prior to the visit, additional usage of staff PPE, and extensive cleaning of exam room while observing appropriate contact time as indicated for disinfecting solutions.   Judy Miller is a 24 year old female with a history of ADHD, allergic rhinitis, GAD, MDD who presents today with a chief complaint of shortness of breath.  She called our nurse triage line on 09/28/20 with reports of mid chest pain and shortness of breath. She found "black mold" in her closet and became concerned and anxious. She was advised to be tested for Covid-19 which was negative.   Today she endorses that the black mold was extensive, she had someone out last week who replaced the entire wall. She continues to notice the mid chest pain and shortness of breath that is intermittent, but believes that her symptoms could be from anxiety. She realizes that she's been around the black mold unknowingly for years and hasn't had any respiratory symptoms. Now that she's aware of the mold, she's felt very anxious.  She denies cough, fevers, long travel. She vapes, is not on OCP's. She endorses a long history of symptoms of daily worry, thinking worst case scenario, feeling restless and fidgety. She's been on antidepressants and anti-anxiety treatment off and on over the years, doesn't really recall if anything helped.   Review of Systems  Constitutional: Negative for chills and fever.  HENT: Negative for congestion.   Respiratory: Positive for chest tightness and shortness of breath. Negative for cough.   Psychiatric/Behavioral: The patient is nervous/anxious.        See HPI       Past Medical History:  Diagnosis Date  . ADHD (attention deficit hyperactivity disorder)   . Anxiety   .  Arthritis    history of this in her knees  . Depression   . Genital herpes 11/2017   pos HSV 2 culture  . Renal stone 01/21/2019  . Vaccine for human papilloma virus (HPV) types 6, 11, 16, and 18 administered      Social History   Socioeconomic History  . Marital status: Single    Spouse name: Not on file  . Number of children: Not on file  . Years of education: Not on file  . Highest education level: Not on file  Occupational History  . Not on file  Tobacco Use  . Smoking status: Current Every Day Smoker    Types: E-cigarettes  . Smokeless tobacco: Never Used  Vaping Use  . Vaping Use: Every day  Substance and Sexual Activity  . Alcohol use: Yes    Alcohol/week: 0.0 standard drinks    Comment: barely  . Drug use: No    Comment: history of   . Sexual activity: Never  Other Topics Concern  . Not on file  Social History Narrative   Single.   Graduated from HS in 2017.    Student in cosmetology school.   Enjoys spending time outdoors, playing with her dog.   Social Determinants of Health   Financial Resource Strain:   . Difficulty of Paying Living Expenses: Not on file  Food Insecurity:   . Worried About Programme researcher, broadcasting/film/video in the Last Year: Not on file  . Ran Out of Food in the Last  Year: Not on file  Transportation Needs:   . Lack of Transportation (Medical): Not on file  . Lack of Transportation (Non-Medical): Not on file  Physical Activity:   . Days of Exercise per Week: Not on file  . Minutes of Exercise per Session: Not on file  Stress:   . Feeling of Stress : Not on file  Social Connections:   . Frequency of Communication with Friends and Family: Not on file  . Frequency of Social Gatherings with Friends and Family: Not on file  . Attends Religious Services: Not on file  . Active Member of Clubs or Organizations: Not on file  . Attends Banker Meetings: Not on file  . Marital Status: Not on file  Intimate Partner Violence:   . Fear of  Current or Ex-Partner: Not on file  . Emotionally Abused: Not on file  . Physically Abused: Not on file  . Sexually Abused: Not on file    History reviewed. No pertinent surgical history.  Family History  Problem Relation Age of Onset  . Arthritis Mother   . Varicose Veins Maternal Grandmother   . Cancer Paternal Grandmother   . Varicose Veins Paternal Grandmother     Allergies  Allergen Reactions  . Cod [employer] Allergy] Hives    Grouper fish  . Fish-Derived Products Hives    Grouper fish  . Other Hives and Other (See Comments)    Red hair dye Crab legs Grouper fish Red dye-hives/seafood-hives,burning in throat Red hair dye  . Shellfish Allergy Other (See Comments)    Crab legs Crab legs    No current outpatient medications on file prior to visit.   No current facility-administered medications on file prior to visit.    BP 110/62   Pulse 92   Temp 98.1 F (36.7 C) (Temporal)   Ht 5\' 6"  (1.676 m)   Wt 130 lb (59 kg)   SpO2 100%   BMI 20.98 kg/m    Objective:   Physical Exam Cardiovascular:     Rate and Rhythm: Normal rate and regular rhythm.  Pulmonary:     Effort: Pulmonary effort is normal.     Breath sounds: Normal breath sounds. No wheezing or rales.  Musculoskeletal:     Cervical back: Neck supple.  Skin:    General: Skin is warm and dry.            Assessment & Plan:

## 2020-10-05 NOTE — Assessment & Plan Note (Signed)
Likely secondary to anxiety. Lungs clear on exam, no evidence of acute illness or distress.  Suspect symptoms are secondary to anxiety, but will check d-dimer and CBC to rule out other causes.

## 2020-10-05 NOTE — Assessment & Plan Note (Signed)
Chronic for years, worse over the last year and more so recently. She's been on Zoloft and Prozac in the past, felt like a "zombie" on one of them.  GAD 7 score of 19 today. She is ready for treatment. Referral placed for therapy.  Rx for Lexapro 10 mg sent to pharmacy.  Patient is to take 1/2 tablet daily for 8 days, then advance to 1 full tablet thereafter. We discussed possible side effects of headache, GI upset, drowsiness, and SI/HI. If thoughts of SI/HI develop, we discussed to present to the emergency immediately. Patient verbalized understanding.   She will update in 4 weeks via My Chart.

## 2020-10-05 NOTE — Patient Instructions (Signed)
Stop by the lab prior to leaving today. I will notify you of your results once received.   Start escitalopram (Lexapro) 10 mg daily for anxiety. Start with 1/2 tablet once daily for about one week, then increase to 1 full tablet thereafter.  Please update me via My Chart in 4 weeks as discussed.  It was a pleasure to see you today!

## 2020-10-06 ENCOUNTER — Ambulatory Visit (INDEPENDENT_AMBULATORY_CARE_PROVIDER_SITE_OTHER)
Admission: RE | Admit: 2020-10-06 | Discharge: 2020-10-06 | Disposition: A | Discharge status: Home / Self Care | Payer: BC Managed Care – PPO | Source: Ambulatory Visit | Attending: Primary Care | Admitting: Primary Care

## 2020-10-06 DIAGNOSIS — R059 Cough, unspecified: Secondary | ICD-10-CM | POA: Diagnosis not present

## 2020-10-06 DIAGNOSIS — R509 Fever, unspecified: Secondary | ICD-10-CM | POA: Diagnosis not present

## 2020-10-06 DIAGNOSIS — R071 Chest pain on breathing: Secondary | ICD-10-CM

## 2020-10-06 LAB — D-DIMER, QUANTITATIVE: D-Dimer, Quant: 0.2 mcg/mL FEU (ref <0.50)

## 2020-10-06 NOTE — Telephone Encounter (Signed)
Noted and appreciate the phone call.  See result note.

## 2020-10-06 NOTE — Telephone Encounter (Signed)
Pt left v/m requesting call back on lab results.

## 2020-10-06 NOTE — Telephone Encounter (Signed)
Called and spoke to [place] about lab results. Judy Miller was fearful that her white blood cells were showing cancer and she states she did not understand what the words meant when reading her results. I reread the results to her and she verbalized understanding. She stated that she will come in tomorrow for the Chest Xray as she has no time today. She had no further questions.

## 2020-10-07 NOTE — Telephone Encounter (Signed)
Pt called back checking on xray results  She wanted to let you know she now is coughing  Up phlem, nose and ears stopped sore throat lose voice.  Chest pain are still there  Best number [phone]

## 2020-10-27 ENCOUNTER — Other Ambulatory Visit: Payer: Self-pay | Admitting: Primary Care

## 2020-10-27 DIAGNOSIS — F411 Generalized anxiety disorder: Secondary | ICD-10-CM

## 2020-10-27 MED ORDER — CITALOPRAM HYDROBROMIDE 20 MG PO TABS: 20.0000 mg | ORAL_TABLET | Freq: Every day | ORAL | 0 refills | Status: DC

## 2020-12-08 ENCOUNTER — Encounter: Payer: BC Managed Care – PPO | Admitting: Advanced Practice Midwife

## 2020-12-13 ENCOUNTER — Other Ambulatory Visit: Payer: Self-pay

## 2020-12-13 ENCOUNTER — Encounter: Payer: Self-pay | Admitting: Obstetrics & Gynecology

## 2020-12-13 ENCOUNTER — Ambulatory Visit (INDEPENDENT_AMBULATORY_CARE_PROVIDER_SITE_OTHER): Payer: BC Managed Care – PPO | Admitting: Obstetrics & Gynecology

## 2020-12-13 ENCOUNTER — Other Ambulatory Visit (HOSPITAL_COMMUNITY)
Admission: RE | Admit: 2020-12-13 | Discharge: 2020-12-13 | Disposition: A | Discharge status: Home / Self Care | Payer: BC Managed Care – PPO | Source: Ambulatory Visit | Attending: Obstetrics & Gynecology | Admitting: Obstetrics & Gynecology

## 2020-12-13 VITALS — BP 126/81 | HR 96 | Wt 138.0 lb

## 2020-12-13 DIAGNOSIS — O98311 Other infections with a predominantly sexual mode of transmission complicating pregnancy, first trimester: Secondary | ICD-10-CM

## 2020-12-13 DIAGNOSIS — Z3401 Encounter for supervision of normal first pregnancy, first trimester: Secondary | ICD-10-CM

## 2020-12-13 DIAGNOSIS — Z31430 Encounter of female for testing for genetic disease carrier status for procreative management: Secondary | ICD-10-CM | POA: Diagnosis not present

## 2020-12-13 DIAGNOSIS — Z3400 Encounter for supervision of normal first pregnancy, unspecified trimester: Secondary | ICD-10-CM | POA: Insufficient documentation

## 2020-12-13 DIAGNOSIS — Z3A12 12 weeks gestation of pregnancy: Secondary | ICD-10-CM

## 2020-12-13 DIAGNOSIS — A6009 Herpesviral infection of other urogenital tract: Secondary | ICD-10-CM

## 2020-12-13 DIAGNOSIS — O98319 Other infections with a predominantly sexual mode of transmission complicating pregnancy, unspecified trimester: Secondary | ICD-10-CM | POA: Insufficient documentation

## 2020-12-13 DIAGNOSIS — Z3481 Encounter for supervision of other normal pregnancy, first trimester: Secondary | ICD-10-CM | POA: Diagnosis not present

## 2020-12-13 DIAGNOSIS — Z8619 Personal history of other infectious and parasitic diseases: Secondary | ICD-10-CM | POA: Insufficient documentation

## 2020-12-13 MED ORDER — VALACYCLOVIR HCL 1 G PO TABS: 1000.0000 mg | ORAL_TABLET | Freq: Every day | ORAL | 2 refills | Status: DC

## 2020-12-13 NOTE — Progress Notes (Signed)
History:   Judy Miller is a 25 y.o. G1P0000 at [redacted]w[redacted]d by early ultrasound today not consistent with LMP (unsure and approximate LMP of 09/27/2020) being seen today for her first obstetrical visit. Patient does intend to breast feed. Pregnancy history fully reviewed.  Patient reports no complaints.        HISTORY: OB History  Gravida Para Term Preterm AB Living  1 0 0 0 0 0  SAB IAB Ectopic Multiple Live Births  0 0 0 0 0    # Outcome Date GA Lbr Len/2nd Weight Sex Delivery Anes PTL Lv  1 Current           Never had a pap smear.   Past Medical History:  Diagnosis Date  . Acne vulgaris 05/12/2015  . ADHD (attention deficit hyperactivity disorder)   . Anxiety   . Arthritis    history of this in her knees  . Depression   . Depression, major, single episode, mild (HCC) 04/23/2013  . Generalized anxiety disorder 11/06/2013  . Genital herpes 11/2017   pos HSV 2 culture  . Renal stone 01/21/2019  . Rhinitis, allergic 11/10/2015  . Vaccine for human papilloma virus (HPV) types 6, 11, 16, and 18 administered    History reviewed. No pertinent surgical history. Family History  Problem Relation Age of Onset  . Arthritis Mother   . Varicose Veins Maternal Grandmother   . Cancer Paternal Grandmother   . Varicose Veins Paternal Grandmother    Social History   Tobacco Use  . Smoking status: Current Every Day Smoker    Types: E-cigarettes  . Smokeless tobacco: Never Used  Vaping Use  . Vaping Use: Every day  Substance Use Topics  . Alcohol use: Yes    Alcohol/week: 0.0 standard drinks    Comment: barely  . Drug use: No    Comment: history of    Allergies  Allergen Reactions  . Cod [employer] Allergy] Hives    Grouper fish  . Fish-Derived Products Hives    Grouper fish  . Other Hives and Other (See Comments)    Red hair dye Crab legs Grouper fish Red dye-hives/seafood-hives,burning in throat Red hair dye  . Shellfish Allergy Other (See Comments)    Crab legs Crab  legs   Current Outpatient Medications on File Prior to Visit  Medication Sig Dispense Refill  . Prenatal Vit-Fe Fumarate-FA (MULTIVITAMIN-PRENATAL) 27-0.8 MG TABS tablet Take 1 tablet by mouth daily at 12 noon.    . citalopram (CELEXA) 20 MG tablet Take 1 tablet (20 mg total) by mouth daily. For anxiety. (Patient not taking: Reported on 12/13/2020) 90 tablet 0   No current facility-administered medications on file prior to visit.    Review of Systems Pertinent items noted in HPI and remainder of comprehensive ROS otherwise negative.  Physical Exam:   Vitals:   12/13/20 1402  BP: 126/81  Pulse: 96  Weight: 138 lb (62.6 kg)   Bedside Ultrasound for FHR check: Viable intrauterine pregnancy with positive cardiac activity noted Fetal Heart Rate (bpm): 168 bpm Patient informed that the ultrasound is considered a limited obstetric ultrasound and is not intended to be a complete ultrasound exam.  Patient also informed that the ultrasound is not being completed with the intent of assessing for fetal or placental anomalies or any pelvic abnormalities.  Explained that the purpose of today's ultrasound is to assess for fetal heart rate.  Patient acknowledges the purpose of the exam and the limitations of the  study. General: well-developed, well-nourished female in no acute distress  Breasts:  normal appearance, no masses or tenderness bilaterally  Skin: normal coloration and turgor, no rashes  Neurologic: oriented, normal, negative, normal mood  Extremities: normal strength, tone, and muscle mass, ROM of all joints is normal  HEENT PERRLA, extraocular movement intact and sclera clear, anicteric  Neck supple and no masses  Cardiovascular: regular rate and rhythm  Respiratory:  no respiratory distress, normal breath sounds  Abdomen: soft, non-tender; bowel sounds normal; no masses,  no organomegaly  Pelvic: normal external genitalia, no lesions, normal vaginal mucosa, normal vaginal discharge,  normal cervix, pap smear done with some minimal bleeding noted.    Assessment:    Pregnancy: G1P0000 Patient Active Problem List   Diagnosis Date Noted  . Encounter for supervision of normal first pregnancy 12/13/2020  . History of genital herpes complicating pregnancy 12/13/2020  . Anxiety and depression 05/12/2015  . ADHD (attention deficit hyperactivity disorder)      Plan:    1. History of genital herpes affecting pregnancy in first trimester Discussed need for suppression later in pregnancy or if she gets any outbreaks during pregnancy. Valtrex prescribed as needed.  - valACYclovir (VALTREX) 1000 MG tablet; Take 1 tablet (1,000 mg total) by mouth daily. Take for 5 days anytime you have an episode. Will also take daily for suppression later in pregnancy.  Dispense: 30 tablet; Refill: 2  2. [redacted] weeks gestation of pregnancy 3. Encounter for supervision of normal first pregnancy in first trimester - Cytology - PAP - CBC/D/Plt+RPR+Rh+ABO+Rub Ab... - Culture, OB Urine - Genetic Screening - Enroll Patient in Babyscripts - Babyscripts Schedule Optimization - [place] MFM OB COMP + 14 WK; Future Initial labs drawn. Continue prenatal vitamins. Problem list reviewed and updated. Genetic Screening discussed, NIPS and carrier screening: ordered. Ultrasound discussed; fetal anatomic survey: ordered. Anticipatory guidance about prenatal visits given including labs, ultrasounds, and testing. Discussed usage of Babyscripts and virtual visits as additional source of managing and completing prenatal visits in midst of coronavirus and pandemic.   Encouraged to complete MyChart Registration for her ability to review results, send requests, and have questions addressed.  The nature of [facility] with multiple MDs and Advanced Practice Providers was explained to patient; also emphasized that residents, students are part of our team. Routine obstetric  precautions reviewed. Encouraged to seek out care at office or emergency room [facility] preferred) for urgent and/or emergent concerns. Return in about 4 weeks (around 01/10/2021) for OFFICE OB VISIT (MD or APP) and AFP screen.      Jaynie Collins, MD, FACOG Obstetrician & Gynecologist, [facility] for [employer], [facility]

## 2020-12-13 NOTE — Progress Notes (Signed)
DATING AND VIABILITY SONOGRAM   Judy Miller is a 25 y.o. year old G1P0000 with LMP Patient's last menstrual period was 09/27/2020 (approximate). which would correlate to  [redacted]w[redacted]d weeks gestation.  She has regular menstrual cycles.   She is here today for a confirmatory initial sonogram.    GESTATION: SINGLETON yes     FETAL ACTIVITY:          Heart rate         168          The fetus is active.   GESTATIONAL AGE AND  BIOMETRICS:  Gestational criteria: Estimated Date of Delivery: 06/27/21 by early ultrasound now at [redacted]w[redacted]d  Previous Scans:0  GESTATIONAL SAC            mm          weeks  CROWN RUMP LENGTH           5.31 mm         12.0 weeks                                                   AVERAGE EGA(BY THIS SCAN):  12.0 weeks  WORKING EDD( early ultrasound ):  06/27/2021     TECHNICIAN COMMENTS:  Patient informed that the ultrasound is considered a limited obstetric ultrasound and is not intended to be a complete ultrasound exam. Patient also informed that the ultrasound is not being completed with the intent of assessing for fetal or placental anomalies or any pelvic abnormalities. Explained that the purpose of today's ultrasound is to assess for fetal heart rate. Patient acknowledges the purpose of the exam and the limitations of the study.       A copy of this report including all images has been saved and backed up to a second source for retrieval if needed. All measures and details of the anatomical scan, placentation, fluid volume and pelvic anatomy are contained in that report.  Scheryl Marten 12/13/2020 3:11 PM

## 2020-12-13 NOTE — Patient Instructions (Signed)
Obstetrics: Normal and Problem Pregnancies (7th ed., pp. 102-121). [place], [place]: Elsevier."> Textbook of Family Medicine (9th ed., pp. 365-410). [place], [place]: [employer].">  First Trimester of Pregnancy  The first trimester of pregnancy starts on the first day of your last menstrual period until the end of week 12. This is months 1 through 3 of pregnancy. A week after a sperm fertilizes an egg, the egg will implant into the wall of the uterus and begin to develop into a baby. By the end of 12 weeks, all the baby's organs will be formed and the baby will be 2-3 inches in size. Body changes during your first trimester Your body goes through many changes during pregnancy. The changes vary and generally return to normal after your baby is born. Physical changes  You may gain or lose weight.  Your breasts may begin to grow larger and become tender. The tissue that surrounds your nipples (areola) may become darker.  Dark spots or blotches (chloasma or mask of pregnancy) may develop on your face.  You may have changes in your hair. These can include thickening or thinning of your hair or changes in texture. Health changes  You may feel nauseous, and you may vomit.  You may have heartburn.  You may develop headaches.  You may develop constipation.  Your gums may bleed and may be sensitive to brushing and flossing. Other changes  You may tire easily.  You may urinate more often.  Your menstrual periods will stop.  You may have a loss of appetite.  You may develop cravings for certain kinds of food.  You may have changes in your emotions from day to day.  You may have more vivid and strange dreams. Follow these instructions at home: Medicines  Follow your health care provider's instructions regarding medicine use. Specific medicines may be either safe or unsafe to take during pregnancy. Do not take any medicines unless told to by your health care provider.  Take a  prenatal vitamin that contains at least 600 micrograms (mcg) of folic acid. Eating and drinking  Eat a healthy diet that includes fresh fruits and vegetables, whole grains, good sources of protein such as meat, eggs, or tofu, and low-fat dairy products.  Avoid raw meat and unpasteurized juice, milk, and cheese. These carry germs that can harm you and your baby.  If you feel nauseous or you vomit: ? Eat 4 or 5 small meals a day instead of 3 large meals. ? Try eating a few soda crackers. ? Drink liquids between meals instead of during meals.  You may need to take these actions to prevent or treat constipation: ? Drink enough fluid to keep your urine pale yellow. ? Eat foods that are high in fiber, such as beans, whole grains, and fresh fruits and vegetables. ? Limit foods that are high in fat and processed sugars, such as fried or sweet foods. Activity  Exercise only as directed by your health care provider. Most people can continue their usual exercise routine during pregnancy. Try to exercise for 30 minutes at least 5 days a week.  Stop exercising if you develop pain or cramping in the lower abdomen or lower back.  Avoid exercising if it is very hot or humid or if you are at high altitude.  Avoid heavy lifting.  If you choose to, you may have sex unless your health care provider tells you not to. Relieving pain and discomfort  Wear a good support bra to relieve breast   tenderness.  Rest with your legs elevated if you have leg cramps or low back pain.  If you develop bulging veins (varicose veins) in your legs: ? Wear support hose as told by your health care provider. ? Elevate your feet for 15 minutes, 3-4 times a day. ? Limit salt in your diet. Safety  Wear your seat belt at all times when driving or riding in a car.  Talk with your health care provider if someone is verbally or physically abusive to you.  Talk with your health care provider if you are feeling sad or have  thoughts of hurting yourself. Lifestyle  Do not use hot tubs, steam rooms, or saunas.  Do not douche. Do not use tampons or scented sanitary pads.  Do not use herbal remedies, alcohol, illegal drugs, or medicines that are not approved by your health care provider. Chemicals in these products can harm your baby.  Do not use any products that contain nicotine or tobacco, such as cigarettes, e-cigarettes, and chewing tobacco. If you need help quitting, ask your health care provider.  Avoid cat litter boxes and soil used by cats. These carry germs that can cause birth defects in the baby and possibly loss of the unborn baby (fetus) by miscarriage or stillbirth. General instructions  During routine prenatal visits in the first trimester, your health care provider will do a physical exam, perform necessary tests, and ask you how things are going. Keep all follow-up visits. This is important.  Ask for help if you have counseling or nutritional needs during pregnancy. Your health care provider can offer advice or refer you to specialists for help with various needs.  Schedule a dentist appointment. At home, brush your teeth with a soft toothbrush. Floss gently.  Write down your questions. Take them to your prenatal visits. Where to find more information  American Pregnancy Association: americanpregnancy.org  American College of Obstetricians and Gynecologists: acog.org/en/Womens%20Health/Pregnancy  Office on Women's Health: womenshealth.gov/pregnancy Contact a health care provider if you have:  Dizziness.  A fever.  Mild pelvic cramps, pelvic pressure, or nagging pain in the abdominal area.  Nausea, vomiting, or diarrhea that lasts for 24 hours or longer.  A bad-smelling vaginal discharge.  Pain when you urinate.  Known exposure to a contagious illness, such as chickenpox, measles, Zika virus, HIV, or hepatitis. Get help right away if you have:  Spotting or bleeding from your  vagina.  Severe abdominal cramping or pain.  Shortness of breath or chest pain.  Any kind of trauma, such as from a fall or a car crash.  New or increased pain, swelling, or redness in an arm or leg. Summary  The first trimester of pregnancy starts on the first day of your last menstrual period until the end of week 12 (months 1 through 3).  Eating 4 or 5 small meals a day rather than 3 large meals may help to relieve nausea and vomiting.  Do not use any products that contain nicotine or tobacco, such as cigarettes, e-cigarettes, and chewing tobacco. If you need help quitting, ask your health care provider.  Keep all follow-up visits. This is important. This information is not intended to replace advice given to you by your health care provider. Make sure you discuss any questions you have with your health care provider. Document Revised: 04/28/2020 Document Reviewed: 03/04/2020 Elsevier Patient Education  2021 Elsevier Inc.  

## 2020-12-14 LAB — CBC/D/PLT+RPR+RH+ABO+RUB AB...
Antibody Screen: NEGATIVE
Basophils Absolute: 0 10*3/uL (ref 0.0–0.2)
Basos: 0 %
EOS (ABSOLUTE): 0.1 10*3/uL (ref 0.0–0.4)
Eos: 1 %
HCV Ab: <0.1 s/co ratio (ref 0.0–0.9)
HIV Screen 4th Generation wRfx: NONREACTIVE
Hematocrit: 40.7 % (ref 34.0–46.6)
Hemoglobin: 13.7 g/dL (ref 11.1–15.9)
Hepatitis B Surface Ag: NEGATIVE
Immature Grans (Abs): 0 10*3/uL (ref 0.0–0.1)
Immature Granulocytes: 0 %
Lymphocytes Absolute: 2 10*3/uL (ref 0.7–3.1)
Lymphs: 15 %
MCH: 28.2 pg (ref 26.6–33.0)
MCHC: 33.7 g/dL (ref 31.5–35.7)
MCV: 84 fL (ref 79–97)
Monocytes Absolute: 0.5 10*3/uL (ref 0.1–0.9)
Monocytes: 4 %
Neutrophils Absolute: 10.4 10*3/uL — ABNORMAL HIGH (ref 1.4–7.0)
Neutrophils: 80 %
Platelets: 342 10*3/uL (ref 150–450)
RBC: 4.85 x10E6/uL (ref 3.77–5.28)
RDW: 12.5 % (ref 11.7–15.4)
RPR Ser Ql: NONREACTIVE
Rh Factor: POSITIVE
Rubella Antibodies, IGG: 4.48 index (ref >0.99)
WBC: 13.1 10*3/uL — ABNORMAL HIGH (ref 3.4–10.8)

## 2020-12-14 LAB — HCV INTERPRETATION

## 2020-12-15 LAB — CULTURE, OB URINE

## 2020-12-15 LAB — URINE CULTURE, OB REFLEX

## 2020-12-16 LAB — CYTOLOGY - PAP
Chlamydia: NEGATIVE
Comment: NEGATIVE
Comment: NEGATIVE
Comment: NORMAL
Diagnosis: NEGATIVE
Neisseria Gonorrhea: NEGATIVE
Trichomonas: NEGATIVE

## 2020-12-21 ENCOUNTER — Telehealth: Payer: Self-pay | Admitting: Radiology

## 2020-12-21 ENCOUNTER — Encounter: Payer: Self-pay | Admitting: Radiology

## 2020-12-21 NOTE — Telephone Encounter (Signed)
Patient was informed of Panorama results, will pick up results of fetal sex from office.

## 2020-12-22 ENCOUNTER — Encounter: Payer: Self-pay | Admitting: Radiology

## 2021-01-03 ENCOUNTER — Ambulatory Visit: Payer: BC Managed Care – PPO

## 2021-01-10 ENCOUNTER — Other Ambulatory Visit: Payer: Self-pay

## 2021-01-10 ENCOUNTER — Ambulatory Visit (INDEPENDENT_AMBULATORY_CARE_PROVIDER_SITE_OTHER): Payer: BC Managed Care – PPO | Admitting: Obstetrics and Gynecology

## 2021-01-10 VITALS — BP 127/76 | HR 96 | Wt 149.0 lb

## 2021-01-10 DIAGNOSIS — Z3A16 16 weeks gestation of pregnancy: Secondary | ICD-10-CM | POA: Diagnosis not present

## 2021-01-10 NOTE — Progress Notes (Signed)
Prenatal Visit Note Date: 01/10/2021 Clinic: [facility]  Subjective:  Judy Miller is a 25 y.o. G1P0000 at [redacted]w[redacted]d being seen today for ongoing prenatal care.  She is currently monitored for the following issues for this low-risk pregnancy and has ADHD (attention deficit hyperactivity disorder); Anxiety and depression; Encounter for supervision of normal first pregnancy; and History of genital herpes complicating pregnancy on their problem list.  Patient reports no complaints.   Contractions: Not present. Vag. Bleeding: None.   . Denies leaking of fluid.   The following portions of the patient's history were reviewed and updated as appropriate: allergies, current medications, past family history, past medical history, past social history, past surgical history and problem list. Problem list updated.  Objective:   Vitals:   01/10/21 1321  BP: 127/76  Pulse: 96  Weight: 149 lb (67.6 kg)    Fetal Status: Fetal Heart Rate (bpm): 156         General:  Alert, oriented and cooperative. Patient is in no acute distress.  Skin: Skin is warm and dry. No rash noted.   Cardiovascular: Normal heart rate noted  Respiratory: Normal respiratory effort, no problems with respiration noted  Abdomen: Soft, gravid, appropriate for gestational age. Pain/Pressure: Absent     Pelvic:  Cervical exam deferred        Extremities: Normal range of motion.  Edema: None  Mental Status: Normal mood and affect. Normal behavior. Normal judgment and thought content.   Urinalysis:      Assessment and Plan:  Pregnancy: G1P0000 at [redacted]w[redacted]d  1. [redacted] weeks gestation of pregnancy Routine care. Anatomy u/s already scheduled. - AFP, Serum, Open Spina Bifida  Preterm labor symptoms and general obstetric precautions including but not limited to vaginal bleeding, contractions, leaking of fluid and fetal movement were reviewed in detail with the patient. Please refer to After Visit Summary for other  counseling recommendations.  Return in about 1 month (around 02/07/2021) for in person or virtual, md or app.   [place] Bing, MD

## 2021-01-12 LAB — AFP, SERUM, OPEN SPINA BIFIDA
AFP MoM: 1.35
AFP Value: 45.6 ng/mL
Gest. Age on Collection Date: 16 weeks
Maternal Age At EDD: 23.2 yr
OSBR Risk 1 IN: 4151
Test Results:: NEGATIVE
Weight: 149 [lb_av]

## 2021-01-27 ENCOUNTER — Ambulatory Visit: Payer: BC Managed Care – PPO | Attending: Maternal & Fetal Medicine

## 2021-01-27 ENCOUNTER — Other Ambulatory Visit: Payer: Self-pay

## 2021-01-27 DIAGNOSIS — Z369 Encounter for antenatal screening, unspecified: Secondary | ICD-10-CM

## 2021-01-27 DIAGNOSIS — Z3401 Encounter for supervision of normal first pregnancy, first trimester: Secondary | ICD-10-CM

## 2021-01-27 DIAGNOSIS — A6009 Herpesviral infection of other urogenital tract: Secondary | ICD-10-CM

## 2021-01-27 DIAGNOSIS — O0001 Abdominal pregnancy with intrauterine pregnancy: Secondary | ICD-10-CM

## 2021-01-27 DIAGNOSIS — Z3402 Encounter for supervision of normal first pregnancy, second trimester: Secondary | ICD-10-CM | POA: Diagnosis not present

## 2021-01-27 DIAGNOSIS — Z3A18 18 weeks gestation of pregnancy: Secondary | ICD-10-CM | POA: Insufficient documentation

## 2021-01-27 DIAGNOSIS — O98312 Other infections with a predominantly sexual mode of transmission complicating pregnancy, second trimester: Secondary | ICD-10-CM

## 2021-02-07 ENCOUNTER — Other Ambulatory Visit: Payer: Self-pay

## 2021-02-07 ENCOUNTER — Ambulatory Visit (INDEPENDENT_AMBULATORY_CARE_PROVIDER_SITE_OTHER): Payer: BC Managed Care – PPO | Admitting: Obstetrics and Gynecology

## 2021-02-07 ENCOUNTER — Encounter: Payer: Self-pay | Admitting: Family Medicine

## 2021-02-07 ENCOUNTER — Other Ambulatory Visit (INDEPENDENT_AMBULATORY_CARE_PROVIDER_SITE_OTHER): Payer: BC Managed Care – PPO

## 2021-02-07 ENCOUNTER — Telehealth (INDEPENDENT_AMBULATORY_CARE_PROVIDER_SITE_OTHER): Payer: BC Managed Care – PPO | Admitting: Family Medicine

## 2021-02-07 VITALS — BP 121/83 | HR 96 | Wt 153.0 lb

## 2021-02-07 VITALS — Ht 66.0 in | Wt 155.0 lb

## 2021-02-07 DIAGNOSIS — J00 Acute nasopharyngitis [common cold]: Secondary | ICD-10-CM | POA: Diagnosis not present

## 2021-02-07 DIAGNOSIS — J029 Acute pharyngitis, unspecified: Secondary | ICD-10-CM

## 2021-02-07 DIAGNOSIS — Z3A20 20 weeks gestation of pregnancy: Secondary | ICD-10-CM

## 2021-02-07 DIAGNOSIS — Z3402 Encounter for supervision of normal first pregnancy, second trimester: Secondary | ICD-10-CM

## 2021-02-07 LAB — POCT RAPID STREP A (OFFICE): Rapid Strep A Screen: NEGATIVE

## 2021-02-07 MED ORDER — IPRATROPIUM BROMIDE 0.06 % NA SOLN: 2.0000 | Freq: Four times a day (QID) | NASAL | 1 refills | Status: DC

## 2021-02-07 NOTE — Progress Notes (Signed)
Patient: Judy Miller MRN: [id number] DOB: 1996-09-13 PCP: Doreene Nest, NP     I connected with [name] on 02/07/21 at 11:44pm by a video enabled telemedicine application and verified that I am speaking with the correct person using two identifiers.  Location patient: Home Location provider: [facility], Office Persons participating in this virtual visit: Dr. Artis Flock and Donnie Aho  I discussed the limitations of evaluation and management by telemedicine and the availability of in person appointments. The patient expressed understanding and agreed to proceed.   Subjective:  Chief Complaint  Patient presents with  . Sore Throat    Not vaccinated for COVID  . Ear Pain  . Nasal Congestion    HPI: The patient is a 25 y.o. female who presents today for sore throat and congestion. She is [redacted] weeks pregnant. She is a G1P0. She is having a girl. She is overall healthy. She does vape and is trying to stop this. She has remote of asthma but grew out of this. She states her throat started hurting a few days ago about 4 days ago and woke up with her throat hurting so bad.  She did bump into a friend who tested positive for strep. No fever/chills. She has a mild cough and no  shortness of breath. She has some nasal congestion that is mild and her ears hurt. She has no sinus pain or pressure. She has no joint pain. She can not feel any lymph nodes on her neck. She had a headache on 04-14-23, but this is gone. Her mom passed away on 2023-04-14. She has only done warm salt water gurgles and tylenol. No contractions, vaginal bleeding or vision changes. She sees her OB today.   Review of Systems  Constitutional: Negative for chills and fever.  HENT: Positive for congestion, ear pain and sore throat. Negative for hearing loss, sinus pressure, sinus pain and trouble swallowing.   Respiratory: Positive for cough. Negative for shortness of breath.   Gastrointestinal: Negative for  abdominal pain, diarrhea, nausea and vomiting.       Gravid    Allergies Patient is allergic to cod [fish allergy], fish-derived products, other, and shellfish allergy.  Past Medical History Patient  has a past medical history of Acne vulgaris (05/12/2015), ADHD (attention deficit hyperactivity disorder), Anxiety, Arthritis, Depression, Depression, major, single episode, mild (HCC) (04/23/2013), Generalized anxiety disorder (11/06/2013), Genital herpes (11/2017), Renal stone (01/21/2019), Rhinitis, allergic (11/10/2015), and Vaccine for human papilloma virus (HPV) types 6, 11, 16, and 18 administered.  Surgical History Patient  has no past surgical history on file.  Family History Pateint's family history includes Arthritis in her mother; Cancer in her paternal grandmother; Varicose Veins in her maternal grandmother and paternal grandmother.  Social History Patient  reports that she has been smoking e-cigarettes. She has never used smokeless tobacco. She reports current alcohol use. She reports that she does not use drugs.    Objective: Vitals:   02/07/21 1113  Weight: 155 lb (70.3 kg)  Height: 5\' 6"  (1.676 m)    Body mass index is 25.02 kg/m.  Physical Exam Vitals reviewed.  Constitutional:      Appearance: She is well-developed.  HENT:     Head: Normocephalic and atraumatic.     Comments: No ttp over sinuses when she presses  Pulmonary:     Effort: Pulmonary effort is normal.     Comments: Comfortable and relaxed. Talking in full complete sentences with no increased work of breathing.  Neurological:     Mental Status: She is alert.        Assessment/plan: 1. Sore throat Discussed based off telehealth visit she sounds more like viral pharyngitis, but will have her go to her clinic and get tested for strep throat with exposure. Her centor criteria <3 so GAS pharyngitis less likely. Also discussed she could get covid tested, but is on day 5 of illness and appears very mild.  Would be good to know for prenatal care though. Precautions given. Continue with conservative therapy of tylenol, cool mist humidifier and warm salt water gurgles.  - POCT rapid strep A; Future  2. Acute rhinitis atrovent daily, class B in pregnancy. Could also do a zyrtec if needed. Rhinitis vs. PND vs. Viral pharyngitis.      Return if symptoms worsen or fail to improve.    Orland Mustard, MD [facility]  02/07/2021

## 2021-02-07 NOTE — Progress Notes (Signed)
Prenatal Visit Note Date: 02/07/2021 Clinic: [facility]  Subjective:  Judy Miller is a 25 y.o. G1P0000 at [redacted]w[redacted]d being seen today for ongoing prenatal care.  She is currently monitored for the following issues for this low-risk pregnancy and has ADHD (attention deficit hyperactivity disorder); Anxiety and depression; Encounter for supervision of normal first pregnancy; and History of genital herpes complicating pregnancy on their problem list.  Patient reports no complaints.   Contractions: Not present. Vag. Bleeding: None.  Movement: Present. Denies leaking of fluid.   The following portions of the patient's history were reviewed and updated as appropriate: allergies, current medications, past family history, past medical history, past social history, past surgical history and problem list. Problem list updated.  Objective:   Vitals:   02/07/21 1423  BP: 121/83  Pulse: 96  Weight: 153 lb (69.4 kg)    Fetal Status: Fetal Heart Rate (bpm): 152 Fundal Height: 22 cm Movement: Present     General:  Alert, oriented and cooperative. Patient is in no acute distress.  Skin: Skin is warm and dry. No rash noted.   Cardiovascular: Normal heart rate noted  Respiratory: Normal respiratory effort, no problems with respiration noted  Abdomen: Soft, gravid, appropriate for gestational age. Pain/Pressure: Absent     Pelvic:  Cervical exam deferred        Extremities: Normal range of motion.  Edema: None  Mental Status: Normal mood and affect. Normal behavior. Normal judgment and thought content.   Urinalysis:      Assessment and Plan:  Pregnancy: G1P0000 at [redacted]w[redacted]d  1. Encounter for supervision of normal first pregnancy in second trimester Routine care. OTC for seasonal allergies d/w her  Preterm labor symptoms and general obstetric precautions including but not limited to vaginal bleeding, contractions, leaking of fluid and fetal movement were reviewed in detail  with the patient. Please refer to After Visit Summary for other counseling recommendations.  Return in about 1 month (around 03/10/2021) for in person, md or app, low risk.    Bing, MD

## 2021-02-22 ENCOUNTER — Observation Stay: Payer: BC Managed Care – PPO

## 2021-02-22 ENCOUNTER — Observation Stay
Admission: EM | Admit: 2021-02-22 | Discharge: 2021-02-22 | Disposition: A | Discharge status: Home / Self Care | Payer: BC Managed Care – PPO | Attending: Obstetrics and Gynecology | Admitting: Obstetrics and Gynecology

## 2021-02-22 DIAGNOSIS — N1330 Unspecified hydronephrosis: Secondary | ICD-10-CM | POA: Diagnosis not present

## 2021-02-22 DIAGNOSIS — Z3A22 22 weeks gestation of pregnancy: Secondary | ICD-10-CM | POA: Insufficient documentation

## 2021-02-22 DIAGNOSIS — N200 Calculus of kidney: Secondary | ICD-10-CM | POA: Diagnosis not present

## 2021-02-22 DIAGNOSIS — O26832 Pregnancy related renal disease, second trimester: Secondary | ICD-10-CM | POA: Diagnosis not present

## 2021-02-22 DIAGNOSIS — O26899 Other specified pregnancy related conditions, unspecified trimester: Secondary | ICD-10-CM | POA: Diagnosis present

## 2021-02-22 DIAGNOSIS — Z87442 Personal history of urinary calculi: Secondary | ICD-10-CM | POA: Insufficient documentation

## 2021-02-22 DIAGNOSIS — R1032 Left lower quadrant pain: Secondary | ICD-10-CM | POA: Diagnosis present

## 2021-02-22 DIAGNOSIS — R109 Unspecified abdominal pain: Secondary | ICD-10-CM | POA: Diagnosis present

## 2021-02-22 DIAGNOSIS — O26892 Other specified pregnancy related conditions, second trimester: Principal | ICD-10-CM | POA: Insufficient documentation

## 2021-02-22 LAB — URINALYSIS, COMPLETE (UACMP) WITH MICROSCOPIC
Bilirubin Urine: NEGATIVE
Glucose, UA: NEGATIVE mg/dL
Hgb urine dipstick: NEGATIVE
Ketones, ur: NEGATIVE mg/dL
Leukocytes,Ua: NEGATIVE
Nitrite: NEGATIVE
Protein, ur: NEGATIVE mg/dL
Specific Gravity, Urine: 1.016 (ref 1.005–1.030)
pH: 6 (ref 5.0–8.0)

## 2021-02-22 LAB — CBC
HCT: 36.5 % (ref 36.0–46.0)
Hemoglobin: 12.2 g/dL (ref 12.0–15.0)
MCH: 29.1 pg (ref 26.0–34.0)
MCHC: 33.4 g/dL (ref 30.0–36.0)
MCV: 87.1 fL (ref 80.0–100.0)
Platelets: 268 10*3/uL (ref 150–400)
RBC: 4.19 MIL/uL (ref 3.87–5.11)
RDW: 12.9 % (ref 11.5–15.5)
WBC: 14.8 10*3/uL — ABNORMAL HIGH (ref 4.0–10.5)
nRBC: 0 % (ref 0.0–0.2)

## 2021-02-22 MED ORDER — ONDANSETRON HCL 4 MG/2ML IJ SOLN
4.0000 mg | Freq: Four times a day (QID) | INTRAMUSCULAR | Status: DC | PRN
  Administered 2021-02-22: 4 mg via INTRAVENOUS
  Filled 2021-02-22: qty 2

## 2021-02-22 MED ORDER — HYDROCODONE-ACETAMINOPHEN 5-325 MG PO TABS: 1.0000 | ORAL_TABLET | Freq: Four times a day (QID) | ORAL | 0 refills | Status: DC | PRN

## 2021-02-22 MED ORDER — ACETAMINOPHEN 325 MG PO TABS
650.0000 mg | ORAL_TABLET | ORAL | Status: DC | PRN
  Administered 2021-02-22: 650 mg via ORAL
  Filled 2021-02-22: qty 2

## 2021-02-22 MED ORDER — LACTATED RINGERS IV SOLN
INTRAVENOUS | Status: DC
  Administered 2021-02-22: 1000 mL via INTRAVENOUS

## 2021-02-22 MED ORDER — MORPHINE SULFATE (PF) 2 MG/ML IV SOLN
INTRAVENOUS | Status: AC
  Filled 2021-02-22: qty 1

## 2021-02-22 MED ORDER — ONDANSETRON 4 MG PO TBDP: 4.0000 mg | ORAL_TABLET | Freq: Four times a day (QID) | ORAL | 0 refills | Status: DC | PRN

## 2021-02-22 MED ORDER — MORPHINE SULFATE (PF) 2 MG/ML IV SOLN
2.0000 mg | INTRAVENOUS | Status: DC | PRN
  Administered 2021-02-22: 2 mg via INTRAVENOUS

## 2021-02-22 NOTE — H&P (Signed)
Obstetric H&P   Chief Complaint: Left flank pain, left lower abdominal pain  Prenatal Care Provider: [facility] Judy Miller  History of Present Illness: 25 y.o. G1P0000 61w1dby 06/27/2021, by Ultrasound presenting to L&D with concern for left lower abdominal pain and left flank pain. Patient states the pain started about 45 minutes ago and is associated with nausea. Patient additionally reports the persistent urge to void. Patient denies ctx, VB, LOF. Patient states she was experiencing +FM, but is not currently in the context of significant pain. Patient does report a history of kidney stones.  Pregravid weight 61.2 kg Total Weight Gain 8.165 kg  October 2021 Problems (from 12/13/20 to present)    Problem Noted Resolved   Encounter for supervision of normal first pregnancy 12/13/2020 by AOsborne Oman MD No   Overview Addendum 01/27/2021 12:44 PM by AOsborne Oman MD     Nursing Staff Provider  Office Location  [place] Dating  LMP  Language   Anatomy [place] Normal  Flu Vaccine   Genetic Screen  NIPS: low risk   AFP:  neg    TDaP Vaccine    Hgb A1C or  GTT Early  Third trimester   COVID Vaccine    LAB RESULTS   Rhogam   Blood Type B/Positive/-- (01/10 1501)   Feeding Plan  Antibody Negative (01/10 1501)  Contraception  Rubella 4.48 (01/10 1501)  Circumcision  RPR Non Reactive (01/10 1501)   Pediatrician   HBsAg Negative (01/10 1501)   Support Person  HCVAb Negative  Prenatal Classes  HIV Non Reactive (01/10 1501)     BTL Consent  GBS     VBAC Consent  Pap     Hgb Electro  neg  BP Cuff  CF neg    SMA neg    Waterbirth  [ ]  Class [ ]  Consent [ ]  CNM visit    Induction  [ ]  Orders Entered [ ] Foley Y/N         Previous Version   History of genital herpes complicating pregnancy 10/05/1102by AOsborne Oman MD No       Review of Systems: 10 point review of systems negative unless otherwise noted in HPI  Past Medical History: Patient Active Problem List    Diagnosis Date Noted  . Left flank pain 02/22/2021  . Left lower quadrant abdominal pain affecting pregnancy 02/22/2021  . Encounter for supervision of normal first pregnancy 12/13/2020     Nursing Staff Provider  Office Location  [place] Dating  LMP  Language   Anatomy [place] Normal  Flu Vaccine   Genetic Screen  NIPS: low risk   AFP:  neg    TDaP Vaccine    Hgb A1C or  GTT Early  Third trimester   COVID Vaccine    LAB RESULTS   Rhogam   Blood Type B/Positive/-- (01/10 1501)   Feeding Plan  Antibody Negative (01/10 1501)  Contraception  Rubella 4.48 (01/10 1501)  Circumcision  RPR Non Reactive (01/10 1501)   Pediatrician   HBsAg Negative (01/10 1501)   Support Person  HCVAb Negative  Prenatal Classes  HIV Non Reactive (01/10 1501)     BTL Consent  GBS     VBAC Consent  Pap     Hgb Electro  neg  BP Cuff  CF neg    SMA neg    Waterbirth  [ ]  Class [ ]  Consent [ ]  CNM visit  Induction  [ ]  Orders Entered [ ] Foley Y/N      . History of genital herpes complicating pregnancy 99/24/2683  . Anxiety and depression 05/12/2015    Fluoxetine 31m did not help with symptom control. Venlafaxine 37.52mtwo po qday did not help much with symptoms control.   . ADHD (attention deficit hyperactivity disorder)     Past Surgical History: No past surgical history on file.  Past Obstetric History: # 1 - Date: None, Sex: None, Weight: None, GA: None, Delivery: None, Apgar1: None, Apgar5: None, Living: None, Birth Comments: None   Past Gynecologic History:  Family History: Family History  Problem Relation Age of Onset  . Arthritis Mother   . Varicose Veins Maternal Grandmother   . Cancer Paternal Grandmother   . Varicose Veins Paternal Grandmother     Social History: Social History   Socioeconomic History  . Marital status: Single    Spouse name: Not on file  . Number of children: Not on file  . Years of education: Not on file  . Highest education level: Not on file  Occupational  History  . Not on file  Tobacco Use  . Smoking status: Current Every Day Smoker    Types: E-cigarettes  . Smokeless tobacco: Never Used  Vaping Use  . Vaping Use: Every day  Substance and Sexual Activity  . Alcohol use: Yes    Alcohol/week: 0.0 standard drinks    Comment: barely  . Drug use: No    Comment: history of   . Sexual activity: Yes    Birth control/protection: None  Other Topics Concern  . Not on file  Social History Narrative   Single.   Graduated from HS in 2017.    Student in cosmetology school.   Enjoys spending time outdoors, playing with her dog.   Social Determinants of Health   Financial Resource Strain: Not on file  Food Insecurity: Not on file  Transportation Needs: Not on file  Physical Activity: Not on file  Stress: Not on file  Social Connections: Not on file  Intimate Partner Violence: Not on file    Medications: Prior to Admission medications   Medication Sig Start Date End Date Taking? Authorizing Provider  ipratropium (ATROVENT) 0.06 % nasal spray Place 2 sprays into both nostrils 4 (four) times daily. 02/07/21  Yes WoOrma FlamingMD  Prenatal Vit-Fe Fumarate-FA (MULTIVITAMIN-PRENATAL) 27-0.8 MG TABS tablet Take 1 tablet by mouth daily at 12 noon.   Yes [provider]  citalopram (CELEXA) 20 MG tablet Take 1 tablet (20 mg total) by mouth daily. For anxiety. Patient not taking: Reported on 02/07/2021 10/27/20   ClPleas KochNP  valACYclovir (VALTREX) 1000 MG tablet Take 1 tablet (1,000 mg total) by mouth daily. Take for 5 days anytime you have an episode. Will also take daily for suppression later in pregnancy. Patient not taking: Reported on 02/22/2021 12/13/20   AnOsborne OmanMD    Allergies: Allergies  Allergen Reactions  . Cod [[employer]] Hives    Grouper fish  . Fish-Derived Products Hives    Grouper fish  . Other Hives and Other (See Comments)    Red hair dye Crab legs Grouper fish Red  dye-hives/seafood-hives,burning in throat Red hair dye  . Shellfish Allergy Other (See Comments)    Crab legs Crab legs    Physical Exam: Vitals: Temperature 97.7 F (36.5 C), temperature source Oral, resp. rate 20, last menstrual period 09/27/2020.  FHT: 147 bmp Toco: No  uterine activity noted  General: NAD HEENT: normocephalic, anicteric Pulmonary: No increased work of breathing Cardiovascular: RRR, distal pulses 2+ Abdomen: Gravid, soft- LLQ tender to palpation, +L CVA tenderness Genitourinary: Deferred Extremities: no edema, erythema, or tenderness Neurologic: Grossly intact Psychiatric: mood appropriate, affect full  Labs: Results for orders placed or performed during the hospital encounter of 02/22/21 (from the past 24 hour(s))  Urinalysis, Complete w Microscopic Urine, Clean Catch     Status: Abnormal   Collection Time: 02/22/21  7:15 AM  Result Value Ref Range   Color, Urine YELLOW (A) YELLOW   APPearance HAZY (A) CLEAR   Specific Gravity, Urine 1.016 1.005 - 1.030   pH 6.0 5.0 - 8.0   Glucose, UA NEGATIVE NEGATIVE mg/dL   Hgb urine dipstick NEGATIVE NEGATIVE   Bilirubin Urine NEGATIVE NEGATIVE   Ketones, ur NEGATIVE NEGATIVE mg/dL   Protein, ur NEGATIVE NEGATIVE mg/dL   Nitrite NEGATIVE NEGATIVE   Leukocytes,Ua NEGATIVE NEGATIVE   RBC / HPF 0-5 0 - 5 RBC/hpf   WBC, UA 0-5 0 - 5 WBC/hpf   Bacteria, UA RARE (A) NONE SEEN   Squamous Epithelial / LPF 0-5 0 - 5   Mucus PRESENT     Assessment: 25 y.o. G1P0000 50w1dby 06/27/2021, by Ultrasound, patient with acute L lower abd pain and L flank pain with CVA tenderness.  Plan: 1) UA ordered, IV fluids, zofran for nausea  2) Fetus: FHT reassuring  3) PNL - Blood type B/Positive/-- (01/10 1501) / Anti-bodyscreen Negative (01/10 1501) / Rubella 4.48 (01/10 1501) / RPR Non Reactive (01/10 1501) / HBsAg Negative (01/10 1501) / HIV Non Reactive (01/10 1501)   4) Immunization History -  Immunization History   Administered Date(s) Administered  . HPV Quadrivalent 04/23/2013, 12/02/2013  . Hepatitis A 02/07/2007, 08/30/2007  . Hepatitis B 06/09/1998, 08/06/1998, 03/31/1999  . MMR 03/31/1999, 05/16/2003  . Tdap 04/23/2013  . Varicella 03/31/1999    5) Disposition - Pending further evaluation; signout completed with Dr. SJulieta Gutting CNM, MSN [facility], [facility] 02/22/2021, 7:38 AM

## 2021-02-22 NOTE — Final Progress Note (Signed)
Physician Final Progress Note  Patient ID: Judy Miller MRN: [id number] DOB/AGE: 1995-12-08 25 y.o.  Admit date: 02/22/2021 Admitting provider: Nadara Mustard, MD Discharge date: 02/22/2021   Admission Diagnoses: left flank pain  Discharge Diagnoses:  Active Problems:   Left flank pain   Left lower quadrant abdominal pain affecting pregnancy  25 y.o. G1P0000 at [redacted]w[redacted]d presenting with acute onset left lower quadrant pain. Evaluation included normal UA.  CBC normal other than mildly elevated WBC but stable from prior baseline.  Renal ultrasound obtained.  Final read pending but no evidence of obstructing stone on preliminary.  The patient does have a history of nephrolithiasis.  She was given IV morphine for pain control and Zofran for nausea.  Pain resolved shortly after return from ultrasound.  Presumptive diagnosis nephrolithiasis with likely passage of stone.  Patient elected for discharge as pain had resolved.    +FM, no LOF, no VB, no contractions.    Blood pressure 113/69, pulse 93, temperature 98.1 F (36.7 C), temperature source Oral, resp. rate 16, last menstrual period 09/27/2020.   Consults: None  Significant Findings/ Diagnostic Studies: Results for orders placed or performed during the hospital encounter of 02/22/21 (from the past 24 hour(s))  Urinalysis, Complete w Microscopic Urine, Clean Catch     Status: Abnormal   Collection Time: 02/22/21  7:15 AM  Result Value Ref Range   Color, Urine YELLOW (A) YELLOW   APPearance HAZY (A) CLEAR   Specific Gravity, Urine 1.016 1.005 - 1.030   pH 6.0 5.0 - 8.0   Glucose, UA NEGATIVE NEGATIVE mg/dL   Hgb urine dipstick NEGATIVE NEGATIVE   Bilirubin Urine NEGATIVE NEGATIVE   Ketones, ur NEGATIVE NEGATIVE mg/dL   Protein, ur NEGATIVE NEGATIVE mg/dL   Nitrite NEGATIVE NEGATIVE   Leukocytes,Ua NEGATIVE NEGATIVE   RBC / HPF 0-5 0 - 5 RBC/hpf   WBC, UA 0-5 0 - 5 WBC/hpf   Bacteria, UA RARE (A) NONE SEEN   Squamous  Epithelial / LPF 0-5 0 - 5   Mucus PRESENT   CBC     Status: Abnormal   Collection Time: 02/22/21  7:56 AM  Result Value Ref Range   WBC 14.8 (H) 4.0 - 10.5 K/uL   RBC 4.19 3.87 - 5.11 MIL/uL   Hemoglobin 12.2 12.0 - 15.0 g/dL   HCT 74.0 81.4 - 48.1 %   MCV 87.1 80.0 - 100.0 fL   MCH 29.1 26.0 - 34.0 pg   MCHC 33.4 30.0 - 36.0 g/dL   RDW 85.6 31.4 - 97.0 %   Platelets 268 150 - 400 K/uL   nRBC 0.0 0.0 - 0.2 %   Procedures:  Positive FHT on admission  Discharge Condition: good  Disposition: Discharge disposition: 01-Home or Self Care       Diet: Regular diet  Discharge Activity: Activity as tolerated  Discharge Instructions    Discharge activity:  No Restrictions   Complete by: As directed    Discharge diet:  No restrictions   Complete by: As directed    No sexual activity restrictions   Complete by: As directed    Notify physician for a general feeling that "something is not right"   Complete by: As directed    Notify physician for increase or change in vaginal discharge   Complete by: As directed    Notify physician for intestinal cramps, with or without diarrhea, sometimes described as "gas pain"   Complete by: As directed    Notify physician  for leaking of fluid   Complete by: As directed    Notify physician for low, dull backache, unrelieved by heat or Tylenol   Complete by: As directed    Notify physician for menstrual like cramps   Complete by: As directed    Notify physician for pelvic pressure   Complete by: As directed    Notify physician for uterine contractions.  These may be painless and feel like the uterus is tightening or the baby is  "balling up"   Complete by: As directed    Notify physician for vaginal bleeding   Complete by: As directed    PRETERM LABOR:  Includes any of the follwing symptoms that occur between 20 - [redacted] weeks gestation.  If these symptoms are not stopped, preterm labor can result in preterm delivery, placing your baby at risk    Complete by: As directed      Allergies as of 02/22/2021      Reactions   Cod [fish Allergy] Hives   Grouper fish   Fish-derived Products Hives   Grouper fish   Other Hives, Other (See Comments)   Red hair dye Crab legs Grouper fish Red dye-hives/seafood-hives,burning in throat Red hair dye   Shellfish Allergy Other (See Comments)   Crab legs Crab legs      Medication List    TAKE these medications   citalopram 20 MG tablet Commonly known as: CELEXA Take 1 tablet (20 mg total) by mouth daily. For anxiety.   HYDROcodone-acetaminophen 5-325 MG tablet Commonly known as: NORCO/VICODIN Take 1 tablet by mouth every 6 (six) hours as needed.   ipratropium 0.06 % nasal spray Commonly known as: ATROVENT Place 2 sprays into both nostrils 4 (four) times daily.   multivitamin-prenatal 27-0.8 MG Tabs tablet Take 1 tablet by mouth daily at 12 noon.   ondansetron 4 MG disintegrating tablet Commonly known as: Zofran ODT Take 1 tablet (4 mg total) by mouth every 6 (six) hours as needed for nausea.   valACYclovir 1000 MG tablet Commonly known as: VALTREX Take 1 tablet (1,000 mg total) by mouth daily. Take for 5 days anytime you have an episode. Will also take daily for suppression later in pregnancy.        Total time spent taking care of this patient: 30 minutes  Signed: Vena Austria 02/22/2021, 1:56 PM

## 2021-02-22 NOTE — OB Triage Note (Signed)
Presents with complaint of left sided abdominal pain that radiates to back. States it woke her from sleep. Denies vaginal bleeding or leakage of fluid.Marland Kitchen Has Left sided flank pain when touched. States she has nausea and vomiting this morning.

## 2021-02-22 NOTE — OB Triage Note (Signed)
MD and RN at bedside to review discharge instructions and prescriptions with patient. All questions answered and patient verbalized understanding.

## 2021-02-22 NOTE — OB Triage Note (Signed)
Patient arrived in triage with c/o of severe left lower quadrant abdominal pain through to back. Pain 10/10 and woke patient up out of sleep about 15 mins. Prior to arrival at the hospital. Patient reports feeling fetal movement. Denies contractions, leaking of fluid, and vaginal bleeding. Abdomen palpates soft. Initial FHR 147.

## 2021-02-22 NOTE — Progress Notes (Signed)
Patient back to L&D from ultrasound.

## 2021-03-01 ENCOUNTER — Other Ambulatory Visit: Payer: Self-pay | Admitting: Family Medicine

## 2021-03-07 ENCOUNTER — Other Ambulatory Visit: Payer: Self-pay

## 2021-03-07 ENCOUNTER — Ambulatory Visit (INDEPENDENT_AMBULATORY_CARE_PROVIDER_SITE_OTHER): Payer: BC Managed Care – PPO | Admitting: Obstetrics and Gynecology

## 2021-03-07 VITALS — BP 114/76 | HR 114 | Wt 163.0 lb

## 2021-03-07 DIAGNOSIS — Z87442 Personal history of urinary calculi: Secondary | ICD-10-CM

## 2021-03-07 DIAGNOSIS — Z3A24 24 weeks gestation of pregnancy: Secondary | ICD-10-CM

## 2021-03-07 DIAGNOSIS — Z3402 Encounter for supervision of normal first pregnancy, second trimester: Secondary | ICD-10-CM

## 2021-03-07 HISTORY — DX: Personal history of urinary calculi: Z87.442

## 2021-03-07 NOTE — Progress Notes (Signed)
   PRENATAL VISIT NOTE  Subjective:  Judy Miller is a 25 y.o. G1P0000 at [redacted]w[redacted]d being seen today for ongoing prenatal care.  She is currently monitored for the following issues for this low-risk pregnancy and has ADHD (attention deficit hyperactivity disorder); Anxiety and depression; Encounter for supervision of normal first pregnancy; History of genital herpes complicating pregnancy; Left flank pain; and Left lower quadrant abdominal pain affecting pregnancy on their problem list.  Patient reports no complaints.  Contractions: Not present. Vag. Bleeding: None.  Movement: Present. Denies leaking of fluid.   The following portions of the patient's history were reviewed and updated as appropriate: allergies, current medications, past family history, past medical history, past social history, past surgical history and problem list.   Objective:   Vitals:   03/07/21 1339  BP: 114/76  Pulse: (!) 114  Weight: 163 lb (73.9 kg)    Fetal Status: Fetal Heart Rate (bpm): 156   Movement: Present     General:  Alert, oriented and cooperative. Patient is in no acute distress.  Skin: Skin is warm and dry. No rash noted.   Cardiovascular: Normal heart rate noted  Respiratory: Normal respiratory effort, no problems with respiration noted  Abdomen: Soft, gravid, appropriate for gestational age.  Pain/Pressure: Absent     Pelvic: Cervical exam deferred        Extremities: Normal range of motion.  Edema: Trace  Mental Status: Normal mood and affect. Normal behavior. Normal judgment and thought content.   Assessment and Plan:  Pregnancy: G1P0000 at [redacted]w[redacted]d 1. [redacted] weeks gestation of pregnancy Routine care. Pt states she believes she passed the stone later the day after going to the hospital. Recommend lots of water daily and to take in even more and with lemon squeezed in if s/s feel like they're starting to come back  Preterm labor symptoms and general obstetric precautions including but not  limited to vaginal bleeding, contractions, leaking of fluid and fetal movement were reviewed in detail with the patient. Please refer to After Visit Summary for other counseling recommendations.   Return in about 3 weeks (around 03/28/2021) for 3-4wks, md or app, low risk, 2hr GTT.  No future appointments.  [place] Bing, MD

## 2021-03-20 ENCOUNTER — Other Ambulatory Visit: Payer: Self-pay

## 2021-03-20 ENCOUNTER — Observation Stay
Admission: EM | Admit: 2021-03-20 | Discharge: 2021-03-20 | Disposition: A | Discharge status: Home / Self Care | Payer: BC Managed Care – PPO | Attending: Cardiology | Admitting: Cardiology

## 2021-03-20 DIAGNOSIS — F419 Anxiety disorder, unspecified: Secondary | ICD-10-CM | POA: Diagnosis not present

## 2021-03-20 DIAGNOSIS — Z3402 Encounter for supervision of normal first pregnancy, second trimester: Secondary | ICD-10-CM

## 2021-03-20 DIAGNOSIS — S81011A Laceration without foreign body, right knee, initial encounter: Secondary | ICD-10-CM

## 2021-03-20 DIAGNOSIS — F329 Major depressive disorder, single episode, unspecified: Secondary | ICD-10-CM | POA: Insufficient documentation

## 2021-03-20 DIAGNOSIS — W1830XA Fall on same level, unspecified, initial encounter: Secondary | ICD-10-CM | POA: Diagnosis not present

## 2021-03-20 DIAGNOSIS — Z79899 Other long term (current) drug therapy: Secondary | ICD-10-CM | POA: Diagnosis not present

## 2021-03-20 DIAGNOSIS — A6009 Herpesviral infection of other urogenital tract: Secondary | ICD-10-CM | POA: Insufficient documentation

## 2021-03-20 DIAGNOSIS — W010XXA Fall on same level from slipping, tripping and stumbling without subsequent striking against object, initial encounter: Secondary | ICD-10-CM | POA: Diagnosis not present

## 2021-03-20 DIAGNOSIS — O9A212 Injury, poisoning and certain other consequences of external causes complicating pregnancy, second trimester: Secondary | ICD-10-CM | POA: Diagnosis not present

## 2021-03-20 DIAGNOSIS — O98312 Other infections with a predominantly sexual mode of transmission complicating pregnancy, second trimester: Secondary | ICD-10-CM | POA: Diagnosis not present

## 2021-03-20 DIAGNOSIS — Z3A25 25 weeks gestation of pregnancy: Secondary | ICD-10-CM | POA: Insufficient documentation

## 2021-03-20 DIAGNOSIS — Z3A24 24 weeks gestation of pregnancy: Secondary | ICD-10-CM

## 2021-03-20 DIAGNOSIS — F909 Attention-deficit hyperactivity disorder, unspecified type: Secondary | ICD-10-CM | POA: Insufficient documentation

## 2021-03-20 NOTE — OB Triage Note (Signed)
Pt is a G1P0 with a EDD of 06/27/21. Pt reports that she walking in the park with her dog and was pulled and pt fell with her right knee on the curb and hit the grass with her baby. Pt states that this was about 1915 and that she had not felt fetal movement until this time.

## 2021-03-20 NOTE — Discharge Summary (Signed)
Please see Final Progress Note.  Mirna Mires, CNM  03/20/2021 10:07 PM

## 2021-03-20 NOTE — Final Progress Note (Signed)
Final Progress Note  Patient ID: Judy Miller MRN: [id number] DOB/AGE: [birth year] 25 y.o.  Admit date: 03/20/2021 Admitting provider: Little Ishikawa, MD Discharge date: 03/20/2021   Admission Diagnoses: fall at [redacted] weeks gestation  Discharge Diagnoses:  Active Problems:   [redacted] weeks gestation of pregnancy   Fall due to stumbling Reassuring fetal hear tones  History of Present Illness: The patient is a 25 y.o. female G1P0000 at [redacted]w[redacted]d who presents for evaluation after falling down outside while walking her dog. She was in the park with family and her dog pulled and tripped her suddenly. She fellforward onto her knees. She cannot remember if she hit her abdomen at the time. Her right kneed has a superficial laceration.  She was concerned that she had not felt fetal movement afterwards,and decided to drive to the hospital.  She denies any contractions or cramping.    Past Medical History:  Diagnosis Date  . Acne vulgaris 05/12/2015  . ADHD (attention deficit hyperactivity disorder)   . Anxiety   . Arthritis    history of this in her knees  . Depression   . Depression, major, single episode, mild (HCC) 04/23/2013  . Generalized anxiety disorder 11/06/2013  . Genital herpes 11/2017   pos HSV 2 culture  . Renal stone 01/21/2019  . Rhinitis, allergic 11/10/2015  . Vaccine for human papilloma virus (HPV) types 6, 11, 16, and 18 administered     No past surgical history on file.  No current facility-administered medications on file prior to encounter.   Current Outpatient Medications on File Prior to Encounter  Medication Sig Dispense Refill  . Prenatal Vit-Fe Fumarate-FA (MULTIVITAMIN-PRENATAL) 27-0.8 MG TABS tablet Take 1 tablet by mouth daily at 12 noon.    . citalopram (CELEXA) 20 MG tablet Take 1 tablet (20 mg total) by mouth daily. For anxiety. (Patient not taking: No sig reported) 90 tablet 0  . HYDROcodone-acetaminophen (NORCO/VICODIN) 5-325 MG tablet Take 1 tablet by  mouth every 6 (six) hours as needed. (Patient not taking: Reported on 03/07/2021) 15 tablet 0  . ipratropium (ATROVENT) 0.06 % nasal spray Place 2 sprays into both nostrils 4 (four) times daily. 15 mL 1  . ondansetron (ZOFRAN ODT) 4 MG disintegrating tablet Take 1 tablet (4 mg total) by mouth every 6 (six) hours as needed for nausea. (Patient not taking: Reported on 03/07/2021) 20 tablet 0  . valACYclovir (VALTREX) 1000 MG tablet Take 1 tablet (1,000 mg total) by mouth daily. Take for 5 days anytime you have an episode. Will also take daily for suppression later in pregnancy. (Patient not taking: Reported on 02/22/2021) 30 tablet 2    Allergies  Allergen Reactions  . Cod [employer] Allergy] Hives    Grouper fish  . Fish-Derived Products Hives    Grouper fish  . Other Hives and Other (See Comments)    Red hair dye Crab legs Grouper fish Red dye-hives/seafood-hives,burning in throat Red hair dye  . Shellfish Allergy Other (See Comments)    Crab legs Crab legs    Social History   Socioeconomic History  . Marital status: Single    Spouse name: Not on file  . Number of children: Not on file  . Years of education: Not on file  . Highest education level: Not on file  Occupational History  . Not on file  Tobacco Use  . Smoking status: Current Every Day Smoker    Types: E-cigarettes  . Smokeless tobacco: Never Used  Vaping Use  .  Vaping Use: Every day  Substance and Sexual Activity  . Alcohol use: Yes    Alcohol/week: 0.0 standard drinks    Comment: barely  . Drug use: No    Comment: history of   . Sexual activity: Yes    Birth control/protection: None  Other Topics Concern  . Not on file  Social History Narrative   Single.   Graduated from HS in 2017.    Student in cosmetology school.   Enjoys spending time outdoors, playing with her dog.   Social Determinants of Health   Financial Resource Strain: Not on file  Food Insecurity: Not on file  Transportation Needs: Not on file   Physical Activity: Not on file  Stress: Not on file  Social Connections: Not on file  Intimate Partner Violence: Not on file    Family History  Problem Relation Age of Onset  . Arthritis Mother   . Varicose Veins Maternal Grandmother   . Cancer Paternal Grandmother   . Varicose Veins Paternal Grandmother      Review of Systems  Constitutional: Negative.   HENT: Negative.   Eyes: Negative.   Respiratory: Negative.   Cardiovascular: Negative.   Gastrointestinal: Negative.   Genitourinary: Negative.   Musculoskeletal: Negative.   Skin: Negative.        Superficial laceration on her right knee.  Neurological: Negative.   Endo/Heme/Allergies: Negative.   Psychiatric/Behavioral: Negative.      Physical Exam: BP 117/71 (BP Location: Right Arm)   Pulse 100   Temp 98.7 F (37.1 C) (Axillary)   Resp (!) 21   Ht 5\' 6"  (1.676 m)   Wt 77.1 kg   LMP 09/27/2020 (Approximate)   SpO2 100%   BMI 27.44 kg/m   Physical Exam Constitutional:      Appearance: Normal appearance.  Genitourinary:     Genitourinary Comments: deferred  HENT:     Head: Normocephalic and atraumatic.  Cardiovascular:     Rate and Rhythm: Normal rate and regular rhythm.     Pulses: Normal pulses.     Heart sounds: Normal heart sounds.  Pulmonary:     Effort: Pulmonary effort is normal.     Breath sounds: Normal breath sounds.  Abdominal:     Palpations: Abdomen is soft.     Comments: Gravid abdomen. FHTS 145-150, reassuring. Fetal movement auddible per EFM, and now felt by patient  Musculoskeletal:        General: Signs of injury present. Normal range of motion.     Cervical back: Normal range of motion and neck supple.     Comments: Superficial laceration on right knee. No swelling. MAEW.  Neurological:     General: No focal deficit present.     Mental Status: She is alert and oriented to person, place, and time.  Skin:    General: Skin is warm and dry.  Psychiatric:        Mood and Affect:  Mood normal.        Behavior: Behavior normal.     Consults: None  Significant Findings/ Diagnostic Studies: EFM for fetal heart ones only.  Procedures: EFM NST Baseline FHR: 145-150 beats/min  Variability: prematurity precludes assessment of variability.  Tocometry: no contractions seen or palpated.   Interpretation:  INDICATIONS: reassuring fetal heart tones within normal baseline for this gestational age.    Hospital Course: The patient was admitted to Labor and Delivery Triage for observation. Her VS were chekced and fetal heart tones were found with both  a hand held doppler and the EFM monitor. After a brief period the patient then noticed fetal movement. Her right knee laceration was cleaned and a dressing applied. Having received reassurance that the FHTS were WNL, and having felt fetal movement without any cramping or abdominal pain, she is discharged home with plans for follow up at her OB GYN provider office this week. She is a patient of [facility] and is encouraged to make an appointment.  Discharge Condition: good  Disposition:  There are no questions and answers to display.        Diet: Regular diet  Discharge Activity: Activity as tolerated   Allergies as of 03/20/2021      Reactions   Cod [fish Allergy] Hives   Grouper fish   Fish-derived Products Hives   Grouper fish   Other Hives, Other (See Comments)   Red hair dye Crab legs Grouper fish Red dye-hives/seafood-hives,burning in throat Red hair dye   Shellfish Allergy Other (See Comments)   Crab legs Crab legs            Total time spent taking care of this patient: 30 minutes  Signed: Mirna Mires, CNM  03/20/2021, 10:24 PM

## 2021-03-22 ENCOUNTER — Other Ambulatory Visit: Payer: Self-pay | Admitting: Obstetrics & Gynecology

## 2021-03-22 DIAGNOSIS — A6009 Herpesviral infection of other urogenital tract: Secondary | ICD-10-CM

## 2021-03-24 DIAGNOSIS — S8001XA Contusion of right knee, initial encounter: Secondary | ICD-10-CM | POA: Diagnosis not present

## 2021-03-26 ENCOUNTER — Emergency Department
Admission: EM | Admit: 2021-03-26 | Discharge: 2021-03-26 | Disposition: A | Discharge status: Home / Self Care | Payer: BC Managed Care – PPO | Attending: Physician Assistant | Admitting: Physician Assistant

## 2021-03-26 ENCOUNTER — Other Ambulatory Visit: Payer: Self-pay

## 2021-03-26 DIAGNOSIS — L0889 Other specified local infections of the skin and subcutaneous tissue: Secondary | ICD-10-CM | POA: Insufficient documentation

## 2021-03-26 DIAGNOSIS — Z3A26 26 weeks gestation of pregnancy: Secondary | ICD-10-CM | POA: Diagnosis not present

## 2021-03-26 DIAGNOSIS — O99332 Smoking (tobacco) complicating pregnancy, second trimester: Secondary | ICD-10-CM | POA: Insufficient documentation

## 2021-03-26 DIAGNOSIS — B9689 Other specified bacterial agents as the cause of diseases classified elsewhere: Secondary | ICD-10-CM | POA: Insufficient documentation

## 2021-03-26 DIAGNOSIS — O99712 Diseases of the skin and subcutaneous tissue complicating pregnancy, second trimester: Secondary | ICD-10-CM | POA: Insufficient documentation

## 2021-03-26 DIAGNOSIS — L089 Local infection of the skin and subcutaneous tissue, unspecified: Secondary | ICD-10-CM | POA: Diagnosis not present

## 2021-03-26 DIAGNOSIS — F17290 Nicotine dependence, other tobacco product, uncomplicated: Secondary | ICD-10-CM | POA: Diagnosis not present

## 2021-03-26 MED ORDER — MUPIROCIN CALCIUM 2 % EX CREA: TOPICAL_CREAM | CUTANEOUS | 0 refills | Status: DC

## 2021-03-26 MED ORDER — MUPIROCIN CALCIUM 2 % EX CREA
TOPICAL_CREAM | Freq: Once | CUTANEOUS | Status: AC
  Administered 2021-03-26: 1 via TOPICAL
  Filled 2021-03-26: qty 15

## 2021-03-26 NOTE — ED Notes (Signed)
Sterile dry dressing applied to right knee.
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
## 2021-03-26 NOTE — ED Notes (Signed)
Medication requested from pharmacy.
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
## 2021-03-26 NOTE — ED Notes (Signed)
Patient provided information about dressing changes and prescription. Patient provided remainder of cream for home use. Patient ambulatory at discharge. Wheelchair offered, patient declined. Patient ambulatory with steady gait. Respirations even and unlabored. NADN.

## 2021-03-26 NOTE — Discharge Instructions (Signed)
Follow discharge care instruction apply Bactroban 3 times a day.

## 2021-03-29 ENCOUNTER — Ambulatory Visit (INDEPENDENT_AMBULATORY_CARE_PROVIDER_SITE_OTHER): Payer: BC Managed Care – PPO | Admitting: Obstetrics & Gynecology

## 2021-03-29 ENCOUNTER — Encounter: Payer: Self-pay | Admitting: Obstetrics & Gynecology

## 2021-03-29 ENCOUNTER — Other Ambulatory Visit: Payer: Self-pay

## 2021-03-29 VITALS — BP 119/81 | HR 105 | Wt 170.0 lb

## 2021-03-29 DIAGNOSIS — Z3402 Encounter for supervision of normal first pregnancy, second trimester: Secondary | ICD-10-CM

## 2021-03-29 DIAGNOSIS — Z3A27 27 weeks gestation of pregnancy: Secondary | ICD-10-CM

## 2021-03-29 NOTE — Patient Instructions (Signed)

## 2021-03-29 NOTE — Progress Notes (Signed)
   PRENATAL VISIT NOTE  Subjective:  Judy Miller is a 25 y.o. G1P0000 at [redacted]w[redacted]d being seen today for ongoing prenatal care.  She is currently monitored for the following issues for this low-risk pregnancy and has ADHD (attention deficit hyperactivity disorder); Anxiety and depression; Encounter for supervision of normal first pregnancy; History of genital herpes complicating pregnancy; Left flank pain; Left lower quadrant abdominal pain affecting pregnancy; History of nephrolithiasis; and Fall due to stumbling on their problem list.  Patient reports no complaints. Recently fell and hurt R knee, treating wound with antibiotic ointment. Contractions: Not present. Vag. Bleeding: None.  Movement: Present. Denies leaking of fluid.   The following portions of the patient's history were reviewed and updated as appropriate: allergies, current medications, past family history, past medical history, past social history, past surgical history and problem list.   Objective:   Vitals:   03/29/21 0826  BP: 119/81  Pulse: (!) 105  Weight: 170 lb (77.1 kg)    Fetal Status: Fetal Heart Rate (bpm): 156 Fundal Height: 27 cm Movement: Present     General:  Alert, oriented and cooperative. Patient is in no acute distress.  Skin: Skin is warm and dry. No rash noted.   Cardiovascular: Normal heart rate noted  Respiratory: Normal respiratory effort, no problems with respiration noted  Abdomen: Soft, gravid, appropriate for gestational age.  Pain/Pressure: Absent     Pelvic: Cervical exam deferred        Extremities: Normal range of motion.  Edema: Trace. Open wound on R knee with ointment over it, mild erythema.  Mental Status: Normal mood and affect. Normal behavior. Normal judgment and thought content.   Assessment and Plan:  Pregnancy: G1P0000 at 108w1d 1. [redacted] weeks gestation of pregnancy 2. Encounter for supervision of normal first pregnancy in second trimester Labs done today, will follow up  results and manage accordingly. Discussed contraception, declined IPP LARCS. Desires pills. - Glucose Tolerance, 2 Hours w/1 Hour - CBC - RPR - HIV Antibody (routine testing w rflx) Wound healing well, told to let us know of any concerns. Preterm labor symptoms and general obstetric precautions including but not limited to vaginal bleeding, contractions, leaking of fluid and fetal movement were reviewed in detail with the patient. Please refer to After Visit Summary for other counseling recommendations.   Return in about 3 weeks (around 04/19/2021) for OFFICE OB VISIT (MD only).  No future appointments.  Jaynie Collins, MD

## 2021-03-30 LAB — CBC
Hematocrit: 36.8 % (ref 34.0–46.6)
Hemoglobin: 12 g/dL (ref 11.1–15.9)
MCH: 28.4 pg (ref 26.6–33.0)
MCHC: 32.6 g/dL (ref 31.5–35.7)
MCV: 87 fL (ref 79–97)
Platelets: 274 10*3/uL (ref 150–450)
RBC: 4.22 x10E6/uL (ref 3.77–5.28)
RDW: 12.3 % (ref 11.7–15.4)
WBC: 13.1 10*3/uL — ABNORMAL HIGH (ref 3.4–10.8)

## 2021-03-30 LAB — GLUCOSE TOLERANCE, 2 HOURS W/ 1HR
Glucose, 1 hour: 159 mg/dL (ref 65–179)
Glucose, 2 hour: 135 mg/dL (ref 65–152)
Glucose, Fasting: 89 mg/dL (ref 65–91)

## 2021-03-30 LAB — HIV ANTIBODY (ROUTINE TESTING W REFLEX): HIV Screen 4th Generation wRfx: NONREACTIVE

## 2021-03-30 LAB — RPR: RPR Ser Ql: NONREACTIVE

## 2021-04-20 ENCOUNTER — Encounter: Payer: Self-pay | Admitting: Family Medicine

## 2021-04-20 ENCOUNTER — Ambulatory Visit (INDEPENDENT_AMBULATORY_CARE_PROVIDER_SITE_OTHER): Payer: BC Managed Care – PPO | Admitting: Family Medicine

## 2021-04-20 ENCOUNTER — Other Ambulatory Visit: Payer: Self-pay

## 2021-04-20 VITALS — BP 118/77 | HR 118 | Wt 179.0 lb

## 2021-04-20 DIAGNOSIS — Z3A30 30 weeks gestation of pregnancy: Secondary | ICD-10-CM

## 2021-04-20 DIAGNOSIS — A6009 Herpesviral infection of other urogenital tract: Secondary | ICD-10-CM

## 2021-04-20 DIAGNOSIS — O98313 Other infections with a predominantly sexual mode of transmission complicating pregnancy, third trimester: Secondary | ICD-10-CM

## 2021-04-20 DIAGNOSIS — O99343 Other mental disorders complicating pregnancy, third trimester: Secondary | ICD-10-CM

## 2021-04-20 DIAGNOSIS — F32A Depression, unspecified: Secondary | ICD-10-CM

## 2021-04-20 DIAGNOSIS — Z23 Encounter for immunization: Secondary | ICD-10-CM

## 2021-04-20 DIAGNOSIS — Z3403 Encounter for supervision of normal first pregnancy, third trimester: Secondary | ICD-10-CM

## 2021-04-20 NOTE — Patient Instructions (Signed)
AREA PEDIATRIC/FAMILY PRACTICE PHYSICIANS  [place] ([address]) . [facility] o Chambliss, MD; Eniola, MD; Hale, MD; Hensel, MD; McDiarmid, MD; McIntyer, MD; Neal, MD; Walden, MD o [address] [place], [place] [address] o [phone] o Mon-Fri 8:30-12:30, 1:30-5:00 o Providers come to see babies at [facility] o Accepting Medicaid . [facility] o Limited providers who accept newborns: Koirala, MD; Morrow, MD; Wolters, MD o [address], [place], [place] [address] o [phone] o Mon-Fri 8:00-5:30 o Babies seen by providers at [facility] o Does NOT accept Medicaid o Please call early in hospitalization for appointment (limited availability)  . [facility] o Mulberry, MD o [address] [place], [place] [address] o [phone] o Mon, Tue, Thur, Fri 8:30-5:00, Wed 10:00-7:00 (closed 1-2pm) o Babies seen by [facility] providers o Accepting Medicaid . [facility] Pediatric[facility] o Rubin, MD o [address], [place], [place] [address] o [phone] o Mon-Fri 8:30-5:00, Sat 8:30-12:00 o Provider comes to see babies at [facility] o Accepting Medicaid o Must have been referred from current patients or contacted office prior to delivery . [facility] ([facility]) o Brown, MD; Chandler, MD; Ettefagh, MD; Grant, MD; Lester, MD; McCormick, MD; McQueen, MD; Prose, MD; Simha, MD; Stanley, MD; Stryffeler, NP; Tebben, NP o [address], [place], [place] [address] o [phone] o Mon, Tue, Thur, Fri 8:30-5:30, Wed 9:30-5:30, Sat 8:30-12:30 o Babies seen by [facility] providers o Accepting Medicaid o Only accepting infants of first-time parents or siblings of current patients o Hospital discharge coordinator will make follow-up appointment . Jack Amos o [address],  [place], [place]  [address] o [phone]   Fax - [phone] . [facility] o [address], [place], [place]  [address] o Phone - [phone]   Fax - [phone] . Shilpa Gosrani o [address], [place], [place]  [address] o [phone]  [place] ([address]) . [facility] o Bates, MD; Brassfield, MD; Cooper, Cox, MD; MD; Davis, MD; Dovico, MD; Ettefaugh, MD; Little, MD; Lowe, MD; Keiffer, MD; Melvin, MD; Sumner, MD; Williams, MD o [address], [place], [place] [address] o [phone] o Mon-Fri 8:30-5:00 (extended evenings Mon-Thur as needed), Sat-Sun 10:00-1:00 o Providers come to see babies at [facility] o Accepting Medicaid for families of first-time babies and families with all children in the household age 3 and under. Must register with office prior to making appointment (M-F only). . [facility] o Henson, NP; Knapp, MD; Lalonde, MD; Tysinger, PA o [address] [place], [place] [address] o [phone] o Mon-Fri 8:00-5:00 o Babies seen by providers at [facility] o Does NOT accept Medicaid/Commercial Insurance Only . [facility] ([facility])  o Artis, MD; Barnes, MD; Bratton, MD; Coccaro, MD; Lockett Gardner, MD; Kramer, MD; Marshall, MD; Netherton, MD; Poleto, MD; Skinner, MD o [address] [place], [place] [address] o [phone] o Mon-Fri 8:30-5:30, Sat (Oct.-Mar.) 9:00-1:00 o Babies seen by providers at [facility] o Accepting Medicaid  [place] ([address]) . [facility] o Reid, MD; Warner, MD o [address], [place], [place] [address] o [phone] o Mon-Fri 8:30-5:00, Sat 8:30-12:00 o Providers come to see babies at [facility] o Does NOT accept Medicaid . [facility] o Becker, PA; Hagler, MD; Scifres, PA; Sun, MD; Swayne, MD o [address],  [place], [place] [address] o [phone] o Mon-Fri 8:00-5:00 o Babies seen by providers at [facility] o Does NOT accept Medicaid o Only accepting babies of parents who are patients o Please call early in hospitalization for appointment (limited availability) . [facility] o Clark, MD; Frye, MD; Kelleher, MD; Mack, NP; Miller, MD; O'Keller, MD; Patterson, NP; Pudlo, MD; Puzio, MD; Thomas, MD; Tucker, MD; Twiselton, MD o [address], [place], [place] [address] o [phone] o Mon-Fri 8:00-5:00, Sat 9:00-12:00 o Providers come to see babies at [facility] o Does NOT accept Medicaid  [place] ([address]) . [facility] o Limited providers accepting new patients: Brake, NP; Wharton, PA o [address], [place], [place] [address] o [phone] o Mon-Fri 8:00-5:00 o Babies seen by providers at [facility] o Does NOT accept Medicaid o Only accepting babies of parents who are patients o Please call early in hospitalization for appointment (limited availability) . [facility] o Gay, MD; Quinlan, MD o [address] [place], [place] [address] o [phone] (press 1 to schedule appointment) o Mon-Fri 8:00-5:00 o Providers come to see babies at [facility] o Does NOT accept Medicaid . [facility] o Mazer, MD o [address] [place], [place] [address] o [phone] o Mon-Fri 8:30-5:00 (lunch 12:30-1:00), extended hours by appointment only Wed 5:00-6:30 o Babies seen by [facility] providers o Accepting Medicaid . [facility] o Banks, MD; Jordan, MD; Koberlein, MD o [address], [place], [place] [address] o [phone] o Mon-Fri 8:00-5:00 o Babies seen by [facility] providers o Does NOT accept Medicaid . [facility] o Parker, MD; Hunter, MD; Wallace, DO o [address] [place], [place]  [address] o [phone] o Mon-Fri 8:00-5:00 o Babies seen by [facility] providers o Does NOT accept Medicaid . [facility] o Brandon, PA; Brecken, PA; Christy, NP; Dees, MD; DeClaire, MD; DeWeese, MD; Hansen, NP; Mills, NP; Parrish, NP; Smoot, NP; Summer, MD; Vapne, MD o [address] [place], [place] [address] o [phone] o Mon-Fri 8:30-5:00, Sat 10:00-1:00 o Providers come to see babies at [facility] o Does NOT accept Medicaid o Free prenatal information session Tuesdays at 4:45pm . [facility] o Bouska, MD; Gordon, PA; Jeffery, PA; Weber, PA o [address] [place] [address] o [phone] o Mon-Fri 7:30-5:30 o Babies seen by [facility] providers . [facility] o [address], [place], [place]  [address] o [phone]   Fax - [phone]  [place] ([address] & [address]) . [facility] o [place]ese, MD o [address] [place], [place] [address] o [phone] o Mon-Thur 8:00-6:00 o Providers come to see babies at [facility] o Accepting Medicaid . [facility] o Anderson, NP; Badger, MD; Beal, PA; Spencer, PA o [address] [place], [place] [address] o [phone] o Mon-Thur 7:30-7:30, Fri 7:30-4:30 o Babies seen by [facility] providers o Accepting Medicaid . [facility] o Agbuya, MD; Klett, NP; Romgoolam, MD o [address], [place], [place] [address] o [phone] o Mon-Fri 8:30-5:00, Sat 8:30-12:00 o Providers come to see babies at [facility] o Accepting Medicaid o Must have "Meet & Greet" appointment at office prior to delivery . [facility] ([facility] o McCord,   MD; Wallace, MD; Wood, MD o [address], [place], [place] [address] o [phone] o Mon-Wed 8:00-6:00, Thur-Fri 8:00-5:00, Sat 9:00-12:00 o Providers come to  see babies at [facility] o Does NOT accept Medicaid o Only accepting siblings of current patients . [facility]  o [address], [place], [place]  [address] o [phone]   Fax - [phone] . [facility] o [address], [place], [place]  [address] o [phone]   Fax - [phone]  [place] ([address]) . [facility] o Cirigliano, DO; Matthews, DO o [address] [place], [place] [address] o [phone] o Mon-Fri 7:00-5:00 o Babies seen by [facility] providers o Does NOT accept Medicaid . [facility] o [place]e, MD; Howley, PA; Moreira, PA o [address], [place], [place] [address] o [phone] o Mon-Fri 8:00-5:00 o Babies seen by [facility] providers o Accepting Medicaid . [facility] o Boyd, MD; Church, PA; Jones, NP; Osborn, PA o [address], , [place] [address] o [phone] o Mon-Fri 8:00-5:00 o Babies seen by providers at [facility] o Accepting Medicaid  [place] ([address]) . [facility] [place] o Wendling, DO o [address] [place], [place] [address] o [phone] o Mon-Fri 8:00-5:00 o Babies seen by [facility] providers o Does NOT accept Medicaid o Limited availability, please call early in hospitalization to schedule follow-up . [facility] o Calderon, PA; Cummings, MD; Dillard, MD; Martin, PA; Olson, MD; VanDeven, PA o [address], [place], [place] [address] o [phone] o Mon-Fri 8:30-5:00, Sat 9:00-12:00 o Babies seen by providers at [facility] o Accepting Medicaid o Please register online then schedule online or call office o [email or web address] . [facility] ([facility] o Hunter, NP; Kumar, MD; Martin Rogers, PA o [address], [place], [place] [address] o [phone] o Mon-Fri 8:00-5:00 o Babies seen by providers at [facility] o Accepting Medicaid . [facility] ([facility] o Sarcoxie, MD; Kristi Fleenor, NP; West, MD o [address], [place], [place] [address] o [phone] o Mon-Fri 8:00-5:30, Sat&Sun by appointment (phones open at 8:30) o Babies seen by [facility] providers o Accepting Medicaid o Must be a first-time baby or sibling of current patient . [facility] [place]  o [address], [place], [place]  [address] o [phone]   Fax - [phone]  [place] ([address]) . [facility] o Brown, PA; Cowen, PA; Rice, MD; Helton, PA; Spry, MD o [address] [place], [place] [address] o [phone] o Mon-Thur 8:00-7:00, Fri 8:00-5:00, Sat 8:00-12:00, Sun 9:00-12:00 o Babies seen by [facility] providers o Accepting Medicaid . [facility] [place] o Coe-Goins, MD; Marshall, MD; Pierre-Louis, MD o [address] [place] [address], [place], [place] [address] o [phone] o Mon-Thur 8:00-5:00 o Babies seen by providers at [facility] o Accepting Medicaid . [facility] o Bratton, MD; Coe-Goins, MD; Hayes, MD; Lewis, MD; List, MD; Lott, MD; Marshall, MD; Moran, MD; O'Neal, MD; Pierre-Louis, MD; Pitonzo, MD; Scholer, MD; Spangle, MD o [address] [place], [place]   [address] o [phone] o Mon-Fri 8:00-5:30, Sat (Oct.-Mar.) 9:00-1:00 o Babies seen by providers at [facility] o Accepting Medicaid o Must fill out new patient packet, available online at [email or web address] . [facility] ([facility]) o Friddle, NP; Harris, NP; Kelly, NP; Logan, MD;  Melvin, PA; Poth, MD; Ramadoss, MD; Stanton, NP o [address], [place], [place] [address] o [phone] o Mon-Thur 8:00-5:30, Fri 8:00-5:00 o Babies seen by providers at [facility] o Accepting Medicaid  [place] ([address]) . [facility] o Dixon, PA; Luna, MD; Pickard, MD; Tapia, PA o [address], [place], [place] [address] o [phone] o Mon-Fri 8:00-5:00 o Babies seen by providers at [facility] o Accepting Medicaid   [place] ([address]) . [facility] [place] o Masneri, DO; Meyers, MD; Nelson, PA o [address], [place], [place] [address] o [phone] o Mon-Fri 8:00-5:00 o Babies seen by providers at [facility] o Does NOT accept Medicaid o Limited appointment availability, please call early in hospitalization  . [facility] [place] o Kunedd, DO; McGowen, MD o [address], [place], [place] [address] o [phone] o Mon-Fri 8:00-5:00 o Babies seen by [facility] providers o Does NOT accept Medicaid . [facility] [place] o Cameron, MD; MacDonald, MD; Michaels, PA; Nayak, MD o [address] [place] [address], [place], [place] [address] o [phone] o Mon-Fri 8:00-5:00 o After hours clinic ([address] [place], [place] [address]) [phone] Mon-Fri 5:00-8:00, Sat 12:00-6:00, Sun 10:00-4:00 o Babies seen by [facility] providers o Accepting Medicaid . [facility] [place] o [address], [place], [place]  [address] o [phone]   Fax - [phone]  [place] ([address]) . [facility] [place] [facility] o Andy, MD o [address], [place], [place] [address] o [phone] o Mon-Fri 8:00-5:00 o Babies seen by [facility] providers o Does NOT accept Medicaid . [facility] [place] ([facility] [place]) o Eksir, MD o [address], [place], [place]  [address] o [phone] o Mon-Thur 8:00-7:00, Fri 8:00-5:00, Sat 8:00-12:00 o Babies seen by providers at [facility] o Accepting Medicaid - but does not have vaccinations in office (must be received elsewhere) o Limited availability, please call early in hospitalization  [place] ([address]) . [facility]  o Charlene Flemming, MD o [address], [place]  [address] o [phone]  Fax [phone]   

## 2021-04-20 NOTE — Progress Notes (Signed)
   PRENATAL VISIT NOTE  Subjective:  Judy Miller is a 25 y.o. G1P0000 at [redacted]w[redacted]d being seen today for ongoing prenatal care.  She is currently monitored for the following issues for this low-risk pregnancy and has ADHD (attention deficit hyperactivity disorder); Anxiety and depression; Encounter for supervision of normal first pregnancy; History of genital herpes complicating pregnancy; Left flank pain; Left lower quadrant abdominal pain affecting pregnancy; History of nephrolithiasis; and Fall due to stumbling on their problem list.  Patient reports no complaints.  Contractions: Not present. Vag. Bleeding: None.  Movement: Present. Denies leaking of fluid.   The following portions of the patient's history were reviewed and updated as appropriate: allergies, current medications, past family history, past medical history, past social history, past surgical history and problem list.   Objective:   Vitals:   04/20/21 1505  BP: 118/77  Pulse: (!) 118  Weight: 179 lb (81.2 kg)    Fetal Status: Fetal Heart Rate (bpm): 155 Fundal Height: 30 cm Movement: Present     General:  Alert, oriented and cooperative. Patient is in no acute distress.  Skin: Skin is warm and dry. No rash noted.   Cardiovascular: Normal heart rate noted  Respiratory: Normal respiratory effort, no problems with respiration noted  Abdomen: Soft, gravid, appropriate for gestational age.  Pain/Pressure: Absent     Pelvic: Cervical exam deferred        Extremities: Normal range of motion.  Edema: Trace  Mental Status: Normal mood and affect. Normal behavior. Normal judgment and thought content.   Assessment and Plan:  Pregnancy: G1P0000 at [redacted]w[redacted]d  1. Genital herpes affecting pregnancy in third trimester Needs ppx at 36 weeks Discussed this today   2. Encounter for supervision of normal first pregnancy in third trimester TWG= 44 lb (20 kg) -- excessive weight gain at this point.  Tdap given today Up to  date Reviewed IUD give patient desires 3 years before next pregnancy  3. Anxiety and depression Stable, feeling well.  Taking Celexa  Preterm labor symptoms and general obstetric precautions including but not limited to vaginal bleeding, contractions, leaking of fluid and fetal movement were reviewed in detail with the patient. Please refer to After Visit Summary for other counseling recommendations.   Return in about 2 weeks (around 05/04/2021) for Routine prenatal care, MD or APP.  No future appointments.  Federico Flake, MD

## 2021-05-09 ENCOUNTER — Encounter: Payer: BC Managed Care – PPO | Admitting: Obstetrics and Gynecology

## 2021-05-11 ENCOUNTER — Other Ambulatory Visit: Payer: Self-pay | Admitting: *Deleted

## 2021-05-11 MED ORDER — FLUCONAZOLE 150 MG PO TABS: 150.0000 mg | ORAL_TABLET | Freq: Once | ORAL | 0 refills | Status: AC

## 2021-05-16 ENCOUNTER — Ambulatory Visit (INDEPENDENT_AMBULATORY_CARE_PROVIDER_SITE_OTHER): Payer: BC Managed Care – PPO | Admitting: Obstetrics & Gynecology

## 2021-05-16 ENCOUNTER — Other Ambulatory Visit: Payer: Self-pay

## 2021-05-16 ENCOUNTER — Other Ambulatory Visit (HOSPITAL_COMMUNITY)
Admission: RE | Admit: 2021-05-16 | Discharge: 2021-05-16 | Disposition: A | Discharge status: Home / Self Care | Payer: BC Managed Care – PPO | Source: Ambulatory Visit | Attending: Obstetrics and Gynecology | Admitting: Obstetrics and Gynecology

## 2021-05-16 VITALS — BP 125/80 | HR 99 | Wt 185.0 lb

## 2021-05-16 DIAGNOSIS — Z3A34 34 weeks gestation of pregnancy: Secondary | ICD-10-CM

## 2021-05-16 DIAGNOSIS — Z7729 Contact with and (suspected ) exposure to other hazardous substances: Secondary | ICD-10-CM

## 2021-05-16 DIAGNOSIS — O98313 Other infections with a predominantly sexual mode of transmission complicating pregnancy, third trimester: Secondary | ICD-10-CM

## 2021-05-16 DIAGNOSIS — O23593 Infection of other part of genital tract in pregnancy, third trimester: Secondary | ICD-10-CM | POA: Diagnosis not present

## 2021-05-16 DIAGNOSIS — N760 Acute vaginitis: Secondary | ICD-10-CM

## 2021-05-16 DIAGNOSIS — O99891 Other specified diseases and conditions complicating pregnancy: Secondary | ICD-10-CM

## 2021-05-16 DIAGNOSIS — Z3403 Encounter for supervision of normal first pregnancy, third trimester: Secondary | ICD-10-CM

## 2021-05-16 DIAGNOSIS — A6009 Herpesviral infection of other urogenital tract: Secondary | ICD-10-CM

## 2021-05-16 MED ORDER — VALACYCLOVIR HCL 1 G PO TABS: 1000.0000 mg | ORAL_TABLET | Freq: Every day | ORAL | 3 refills | Status: DC

## 2021-05-16 NOTE — Patient Instructions (Signed)
Can mice be harmful to pregnancy? If you're pregnant or planning to get pregnant, be very careful with rodents like hamsters, [place] pigs and mice. They may carry a virus called lymphocytic choriomeningitis virus (also called LCMV) that can be harmful to you and your baby. LCMV can cause severe birth defects and miscarriage.  You can also be exposed to toxoplasmosis.

## 2021-05-16 NOTE — Progress Notes (Signed)
   PRENATAL VISIT NOTE  Subjective:  Judy Miller is a 25 y.o. G1P0000 at [redacted]w[redacted]d being seen today for ongoing prenatal care.  She is currently monitored for the following issues for this low-risk pregnancy and has ADHD (attention deficit hyperactivity disorder); Anxiety and depression; Encounter for supervision of normal first pregnancy; History of genital herpes complicating pregnancy; Left flank pain; Left lower quadrant abdominal pain affecting pregnancy; History of nephrolithiasis; and Fall due to stumbling on their problem list.  Patient reports vaginal irritation and recent HSV outbreak last week .  Treated the outbreak with Valtrex, still taking it. Also took antifungal, wants to make sure she does not have BV.  Also exposed to mice in her car, trying to exterminate them now.  Denies any other symptoms.  Contractions: Not present. Vag. Bleeding: None.  Movement: Present. Denies leaking of fluid.   The following portions of the patient's history were reviewed and updated as appropriate: allergies, current medications, past family history, past medical history, past social history, past surgical history and problem list.   Objective:   Vitals:   05/16/21 1431  BP: 125/80  Pulse: 99  Weight: 185 lb (83.9 kg)    Fetal Status: Fetal Heart Rate (bpm): 146 Fundal Height: 33 cm Movement: Present     General:  Alert, oriented and cooperative. Patient is in no acute distress.  Skin: Skin is warm and dry. No rash noted.   Cardiovascular: Normal heart rate noted  Respiratory: Normal respiratory effort, no problems with respiration noted  Abdomen: Soft, gravid, appropriate for gestational age.  Pain/Pressure: Absent     Pelvic: Cervical exam deferred        Extremities: Normal range of motion.  Edema: Trace  Mental Status: Normal mood and affect. Normal behavior. Normal judgment and thought content.   Assessment and Plan:  Pregnancy: G1P0000 at [redacted]w[redacted]d 1. Vaginitis affecting pregnancy  in third trimester, antepartum - Cervicovaginal ancillary only done. will follow up results and manage accordingly.  2. Genital herpes affecting pregnancy in third trimester Patient aware that presence of lesions/prodromal symptoms during labor will need cesarean section.  Will continue to follow closely. For now, she will continue Valtrex suppression. - valACYclovir (VALTREX) 1000 MG tablet; Take 1 tablet (1,000 mg total) by mouth daily.  Dispense: 60 tablet; Refill: 3  3. Exposure to rodent Cautioned about exposure to mice, risk of acquiring lymphocytic choriomeningitis virus (LCMV) or toxoplasmosis and risk of harm to the fetus.   4. [redacted] weeks gestation of pregnancy 5. Encounter for supervision of normal first pregnancy in third trimester Preterm labor symptoms and general obstetric precautions including but not limited to vaginal bleeding, contractions, leaking of fluid and fetal movement were reviewed in detail with the patient. Please refer to After Visit Summary for other counseling recommendations.   Return in about 2 weeks (around 05/30/2021) for Pelvic cultures, OFFICE OB VISIT (MD only).  Future Appointments  Date Time Provider Department Center  05/30/2021  1:15 PM [place] Bing, MD [facility]  06/07/2021 10:45 AM Reva Bores, MD [facility]  06/13/2021  2:30 PM Sebastian Bing, MD [facility]  06/20/2021  2:15 PM Brendan Gruwell, Jethro Bastos, MD [facility]    Jaynie Collins, MD

## 2021-05-18 LAB — CERVICOVAGINAL ANCILLARY ONLY
Bacterial Vaginitis (gardnerella): NEGATIVE
Candida Glabrata: NEGATIVE
Candida Vaginitis: NEGATIVE
Comment: NEGATIVE
Comment: NEGATIVE
Comment: NEGATIVE
Comment: NEGATIVE
Trichomonas: NEGATIVE

## 2021-05-30 ENCOUNTER — Other Ambulatory Visit: Payer: Self-pay

## 2021-05-30 ENCOUNTER — Other Ambulatory Visit (HOSPITAL_COMMUNITY)
Admission: RE | Admit: 2021-05-30 | Discharge: 2021-05-30 | Disposition: A | Discharge status: Home / Self Care | Payer: BC Managed Care – PPO | Source: Ambulatory Visit | Attending: Obstetrics and Gynecology | Admitting: Obstetrics and Gynecology

## 2021-05-30 ENCOUNTER — Telehealth: Payer: Self-pay

## 2021-05-30 ENCOUNTER — Ambulatory Visit (INDEPENDENT_AMBULATORY_CARE_PROVIDER_SITE_OTHER): Payer: BC Managed Care – PPO | Admitting: Obstetrics and Gynecology

## 2021-05-30 VITALS — BP 126/82 | HR 101 | Wt 190.6 lb

## 2021-05-30 DIAGNOSIS — A6009 Herpesviral infection of other urogenital tract: Secondary | ICD-10-CM

## 2021-05-30 DIAGNOSIS — O98313 Other infections with a predominantly sexual mode of transmission complicating pregnancy, third trimester: Secondary | ICD-10-CM

## 2021-05-30 DIAGNOSIS — Z3403 Encounter for supervision of normal first pregnancy, third trimester: Secondary | ICD-10-CM | POA: Insufficient documentation

## 2021-05-30 DIAGNOSIS — Z3A36 36 weeks gestation of pregnancy: Secondary | ICD-10-CM

## 2021-05-30 NOTE — Telephone Encounter (Signed)
Pt called stating she noticed underwear was "wet" after going to bathroom. Pt explains a clear substance that is consistent with gel/lubricant.  Pt advised to monitor symptoms until 5, if symptoms are worse per Dr. Vergie Living pt can report to MAU Pt verbalizes understanding.

## 2021-05-30 NOTE — Progress Notes (Signed)
   PRENATAL VISIT NOTE  Subjective:  Judy Miller is a 25 y.o. G1P0000 at [redacted]w[redacted]d being seen today for ongoing prenatal care.  She is currently monitored for the following issues for this low-risk pregnancy and has ADHD (attention deficit hyperactivity disorder); Anxiety and depression; Encounter for supervision of normal first pregnancy; History of genital herpes complicating pregnancy; Left flank pain; Left lower quadrant abdominal pain affecting pregnancy; History of nephrolithiasis; and Fall due to stumbling on their problem list.  Patient reports no complaints.  Contractions: Not present. Vag. Bleeding: None.  Movement: Present. Denies leaking of fluid.   The following portions of the patient's history were reviewed and updated as appropriate: allergies, current medications, past family history, past medical history, past social history, past surgical history and problem list.   Objective:   Vitals:   05/30/21 1316  BP: 126/82  Pulse: (!) 101  Weight: 190 lb 9.6 oz (86.5 kg)    Fetal Status: Fetal Heart Rate (bpm): 136 Fundal Height: 36 cm Movement: Present  Presentation: Vertex  General:  Alert, oriented and cooperative. Patient is in no acute distress.  Skin: Skin is warm and dry. No rash noted.   Cardiovascular: Normal heart rate noted  Respiratory: Normal respiratory effort, no problems with respiration noted  Abdomen: Soft, gravid, appropriate for gestational age.  Pain/Pressure: Absent     Pelvic: Cervical exam performed in the presence of a chaperone Dilation: Closed Effacement (%): Thick    Extremities: Normal range of motion.  Edema: None  Mental Status: Normal mood and affect. Normal behavior. Normal judgment and thought content.   Assessment and Plan:  Pregnancy: G1P0000 at [redacted]w[redacted]d 1. Encounter for supervision of normal first pregnancy in third trimester D/w pt more re: BC nv - Strep Gp B NAA - GC/Chlamydia probe amp ([facility])not at [facility]  2. [redacted] weeks  gestation of pregnancy  3. Genital herpes affecting pregnancy in third trimester No issues. Continue vlatrex ppx.   Preterm labor symptoms and general obstetric precautions including but not limited to vaginal bleeding, contractions, leaking of fluid and fetal movement were reviewed in detail with the patient. Please refer to After Visit Summary for other counseling recommendations.   Return in about 1 week (around 06/06/2021) for low risk ob, in person, md or app.  Future Appointments  Date Time Provider Department Center  06/07/2021 10:45 AM Reva Bores, MD [facility]  06/13/2021  2:30 PM [place] Bing, MD [facility]  06/20/2021  2:15 PM Anyanwu, Jethro Bastos, MD [facility]  06/27/2021  1:15 PM Federico Flake, MD [facility]    Ravanna Bing, MD

## 2021-05-31 LAB — GC/CHLAMYDIA PROBE AMP (~~LOC~~) NOT AT ARMC
Chlamydia: NEGATIVE
Comment: NEGATIVE
Comment: NORMAL
Neisseria Gonorrhea: NEGATIVE

## 2021-06-01 LAB — STREP GP B NAA: Strep Gp B NAA: NEGATIVE

## 2021-06-07 ENCOUNTER — Other Ambulatory Visit: Payer: Self-pay

## 2021-06-07 ENCOUNTER — Ambulatory Visit (INDEPENDENT_AMBULATORY_CARE_PROVIDER_SITE_OTHER): Payer: BC Managed Care – PPO | Admitting: Family Medicine

## 2021-06-07 VITALS — BP 130/88 | HR 123 | Wt 195.0 lb

## 2021-06-07 DIAGNOSIS — O98313 Other infections with a predominantly sexual mode of transmission complicating pregnancy, third trimester: Secondary | ICD-10-CM

## 2021-06-07 DIAGNOSIS — O169 Unspecified maternal hypertension, unspecified trimester: Secondary | ICD-10-CM | POA: Diagnosis not present

## 2021-06-07 DIAGNOSIS — A6009 Herpesviral infection of other urogenital tract: Secondary | ICD-10-CM

## 2021-06-07 DIAGNOSIS — Z3403 Encounter for supervision of normal first pregnancy, third trimester: Secondary | ICD-10-CM

## 2021-06-07 NOTE — Progress Notes (Signed)
ROB 37.1 Reports "lightning crotch" symptom has increased. Concerned with 5 lb weight gain. BP elevated from previous. Denies unusual headache, blurry vision, RUQ abd pain, or increased heartburn.

## 2021-06-07 NOTE — Progress Notes (Signed)
    PRENATAL VISIT NOTE  Subjective:  Judy Miller is a 25 y.o. G1P0000 at [redacted]w[redacted]d being seen today for ongoing prenatal care.  She is currently monitored for the following issues for this low-risk pregnancy and has ADHD (attention deficit hyperactivity disorder); Anxiety and depression; Encounter for supervision of normal first pregnancy; History of genital herpes complicating pregnancy; Left flank pain; Left lower quadrant abdominal pain affecting pregnancy; History of nephrolithiasis; and Fall due to stumbling on their problem list.  Patient reports no complaints.  Contractions: Not present. Vag. Bleeding: None.  Movement: Present. Denies leaking of fluid.   The following portions of the patient's history were reviewed and updated as appropriate: allergies, current medications, past family history, past medical history, past social history, past surgical history and problem list.   Objective:   Vitals:   06/07/21 1054 06/07/21 1143  BP: (!) 143/83 130/88  Pulse: (!) 123   Weight: 195 lb (88.5 kg)     Fetal Status: Fetal Heart Rate (bpm): 145 Fundal Height: 34 cm Movement: Present  Presentation: Vertex  General:  Alert, oriented and cooperative. Patient is in no acute distress.  Skin: Skin is warm and dry. No rash noted.   Cardiovascular: Normal heart rate noted  Respiratory: Normal respiratory effort, no problems with respiration noted  Abdomen: Soft, gravid, appropriate for gestational age.  Pain/Pressure: Absent     Pelvic: Cervical exam deferred        Extremities: Normal range of motion.  Edema: Trace  Mental Status: Normal mood and affect. Normal behavior. Normal judgment and thought content.   Assessment and Plan:  Pregnancy: G1P0000 at [redacted]w[redacted]d 1. Genital herpes affecting pregnancy in third trimester On valtrex  2. Encounter for supervision of normal first pregnancy in third trimester Continue routine prenatal care.  3. Elevated blood pressure affecting pregnancy,  antepartum Better on repeat--1st elevation Check labs Repeat BP tomorrow. - CBC - Comprehensive metabolic panel - Protein / creatinine ratio, urine  Preterm labor symptoms and general obstetric precautions including but not limited to vaginal bleeding, contractions, leaking of fluid and fetal movement were reviewed in detail with the patient. Please refer to After Visit Summary for other counseling recommendations.   Return in 1 week (on 06/14/2021).  Future Appointments  Date Time Provider Department Center  06/08/2021  8:15 AM [facility] NURSE [facility]  06/13/2021  2:30 PM Milford Bing, MD [facility]  06/20/2021  2:15 PM Anyanwu, Jethro Bastos, MD [facility]  06/27/2021  1:15 PM Federico Flake, MD [facility]    Reva Bores, MD

## 2021-06-08 ENCOUNTER — Ambulatory Visit (INDEPENDENT_AMBULATORY_CARE_PROVIDER_SITE_OTHER): Payer: BC Managed Care – PPO | Admitting: *Deleted

## 2021-06-08 DIAGNOSIS — Z3403 Encounter for supervision of normal first pregnancy, third trimester: Secondary | ICD-10-CM

## 2021-06-08 LAB — PROTEIN / CREATININE RATIO, URINE
Creatinine, Urine: 62.3 mg/dL
Protein, Ur: 18.2 mg/dL
Protein/Creat Ratio: 292 mg/g creat — ABNORMAL HIGH (ref 0–200)

## 2021-06-08 LAB — COMPREHENSIVE METABOLIC PANEL
ALT: 11 IU/L (ref 0–32)
AST: 13 IU/L (ref 0–40)
Albumin/Globulin Ratio: 1.3 (ref 1.2–2.2)
Albumin: 3.3 g/dL — ABNORMAL LOW (ref 3.9–5.0)
Alkaline Phosphatase: 187 IU/L — ABNORMAL HIGH (ref 44–121)
BUN/Creatinine Ratio: 13 (ref 9–23)
BUN: 6 mg/dL (ref 6–20)
Bilirubin Total: <0.2 mg/dL (ref 0.0–1.2)
CO2: 18 mmol/L — ABNORMAL LOW (ref 20–29)
Calcium: 9.3 mg/dL (ref 8.7–10.2)
Chloride: 100 mmol/L (ref 96–106)
Creatinine, Ser: 0.48 mg/dL — ABNORMAL LOW (ref 0.57–1.00)
Globulin, Total: 2.5 g/dL (ref 1.5–4.5)
Glucose: 177 mg/dL — ABNORMAL HIGH (ref 65–99)
Potassium: 4.4 mmol/L (ref 3.5–5.2)
Sodium: 135 mmol/L (ref 134–144)
Total Protein: 5.8 g/dL — ABNORMAL LOW (ref 6.0–8.5)
eGFR: 136 mL/min/{1.73_m2} (ref >59)

## 2021-06-08 LAB — CBC
Hematocrit: 35.6 % (ref 34.0–46.6)
Hemoglobin: 11.4 g/dL (ref 11.1–15.9)
MCH: 25 pg — ABNORMAL LOW (ref 26.6–33.0)
MCHC: 32 g/dL (ref 31.5–35.7)
MCV: 78 fL — ABNORMAL LOW (ref 79–97)
Platelets: 265 10*3/uL (ref 150–450)
RBC: 4.56 x10E6/uL (ref 3.77–5.28)
RDW: 13.8 % (ref 11.7–15.4)
WBC: 11.8 10*3/uL — ABNORMAL HIGH (ref 3.4–10.8)

## 2021-06-08 NOTE — Progress Notes (Signed)
Subjective:  Judy Miller is a 25 y.o. female here for BP check.   Hypertension ROS: no TIA's, no chest pain on exertion, no dyspnea on exertion, and no swelling of ankles.    Objective:  LMP 09/27/2020 (Approximate)   Appearance alert, well appearing, and in no distress. General exam BP noted to be well controlled today in office.    Assessment:   Blood Pressure stable.   Plan:  Dr. Shawnie Pons informed of blood pressure today .  Patient to return in 2 days for another BP recheck.

## 2021-06-08 NOTE — Progress Notes (Signed)
Patient seen and assessed by nursing staff.  Agree with documentation and plan.  

## 2021-06-09 ENCOUNTER — Telehealth: Payer: Self-pay

## 2021-06-09 NOTE — Telephone Encounter (Addendum)
Error

## 2021-06-10 ENCOUNTER — Other Ambulatory Visit: Payer: Self-pay

## 2021-06-10 ENCOUNTER — Ambulatory Visit (INDEPENDENT_AMBULATORY_CARE_PROVIDER_SITE_OTHER): Payer: BC Managed Care – PPO

## 2021-06-10 VITALS — BP 128/91 | HR 114

## 2021-06-10 DIAGNOSIS — Z3403 Encounter for supervision of normal first pregnancy, third trimester: Secondary | ICD-10-CM

## 2021-06-10 NOTE — Progress Notes (Signed)
Pt here for BP check.  Bp 128/91 Pulse: 114  Per Dr. Shawnie Pons repeat labs today, order placed.   Pt advised to report to [facility] if symptoms of headache or blurred vision appear over the weekend.

## 2021-06-11 LAB — COMPREHENSIVE METABOLIC PANEL
ALT: 14 IU/L (ref 0–32)
AST: 16 IU/L (ref 0–40)
Albumin/Globulin Ratio: 1.3 (ref 1.2–2.2)
Albumin: 3.5 g/dL — ABNORMAL LOW (ref 3.9–5.0)
Alkaline Phosphatase: 183 IU/L — ABNORMAL HIGH (ref 44–121)
BUN/Creatinine Ratio: 14 (ref 9–23)
BUN: 7 mg/dL (ref 6–20)
Bilirubin Total: 0.2 mg/dL (ref 0.0–1.2)
CO2: 16 mmol/L — ABNORMAL LOW (ref 20–29)
Calcium: 9.4 mg/dL (ref 8.7–10.2)
Chloride: 101 mmol/L (ref 96–106)
Creatinine, Ser: 0.49 mg/dL — ABNORMAL LOW (ref 0.57–1.00)
Globulin, Total: 2.6 g/dL (ref 1.5–4.5)
Glucose: 195 mg/dL — ABNORMAL HIGH (ref 65–99)
Potassium: 4.2 mmol/L (ref 3.5–5.2)
Sodium: 134 mmol/L (ref 134–144)
Total Protein: 6.1 g/dL (ref 6.0–8.5)
eGFR: 136 mL/min/{1.73_m2} (ref >59)

## 2021-06-11 LAB — CBC
Hematocrit: 38.8 % (ref 34.0–46.6)
Hemoglobin: 11.9 g/dL (ref 11.1–15.9)
MCH: 24.4 pg — ABNORMAL LOW (ref 26.6–33.0)
MCHC: 30.7 g/dL — ABNORMAL LOW (ref 31.5–35.7)
MCV: 80 fL (ref 79–97)
Platelets: 247 10*3/uL (ref 150–450)
RBC: 4.88 x10E6/uL (ref 3.77–5.28)
RDW: 14.3 % (ref 11.7–15.4)
WBC: 11.9 10*3/uL — ABNORMAL HIGH (ref 3.4–10.8)

## 2021-06-11 LAB — PROTEIN / CREATININE RATIO, URINE
Creatinine, Urine: 73.5 mg/dL
Protein, Ur: 18.6 mg/dL
Protein/Creat Ratio: 253 mg/g creat — ABNORMAL HIGH (ref 0–200)

## 2021-06-12 NOTE — Progress Notes (Signed)
Patient seen and assessed by nursing staff.  Agree with documentation and plan.  

## 2021-06-13 ENCOUNTER — Ambulatory Visit (INDEPENDENT_AMBULATORY_CARE_PROVIDER_SITE_OTHER): Payer: BC Managed Care – PPO | Admitting: Family Medicine

## 2021-06-13 ENCOUNTER — Encounter: Payer: Self-pay | Admitting: Family Medicine

## 2021-06-13 ENCOUNTER — Other Ambulatory Visit: Payer: Self-pay

## 2021-06-13 ENCOUNTER — Ambulatory Visit (INDEPENDENT_AMBULATORY_CARE_PROVIDER_SITE_OTHER): Payer: BC Managed Care – PPO | Admitting: Obstetrics and Gynecology

## 2021-06-13 ENCOUNTER — Encounter: Payer: Self-pay | Admitting: Obstetrics and Gynecology

## 2021-06-13 VITALS — BP 129/90 | HR 120 | Wt 193.0 lb

## 2021-06-13 VITALS — BP 132/84 | HR 125 | Temp 98.1°F | Ht 66.0 in | Wt 192.0 lb

## 2021-06-13 DIAGNOSIS — O133 Gestational [pregnancy-induced] hypertension without significant proteinuria, third trimester: Secondary | ICD-10-CM | POA: Diagnosis not present

## 2021-06-13 DIAGNOSIS — L989 Disorder of the skin and subcutaneous tissue, unspecified: Secondary | ICD-10-CM

## 2021-06-13 DIAGNOSIS — R000 Tachycardia, unspecified: Secondary | ICD-10-CM

## 2021-06-13 DIAGNOSIS — Z3A38 38 weeks gestation of pregnancy: Secondary | ICD-10-CM

## 2021-06-13 HISTORY — DX: Gestational (pregnancy-induced) hypertension without significant proteinuria, third trimester: O13.3

## 2021-06-13 LAB — CBC
Hematocrit: 35.7 % (ref 34.0–46.6)
Hemoglobin: 11.6 g/dL (ref 11.1–15.9)
MCH: 25.2 pg — ABNORMAL LOW (ref 26.6–33.0)
MCHC: 32.5 g/dL (ref 31.5–35.7)
MCV: 77 fL — ABNORMAL LOW (ref 79–97)
Platelets: 239 10*3/uL (ref 150–450)
RBC: 4.61 x10E6/uL (ref 3.77–5.28)
RDW: 14.1 % (ref 11.7–15.4)
WBC: 12.8 10*3/uL — ABNORMAL HIGH (ref 3.4–10.8)

## 2021-06-13 NOTE — Patient Instructions (Signed)
Come into the hospital at 1145 pm on Wednesday, July 13 for your induction

## 2021-06-13 NOTE — Progress Notes (Signed)
    PRENATAL VISIT NOTE  Subjective:  Judy Miller is a 25 y.o. G1P0000 at [redacted]w[redacted]d being seen today for ongoing prenatal care.  She is currently monitored for the following issues for this high-risk pregnancy and has ADHD (attention deficit hyperactivity disorder); Skin lesion; Anxiety and depression; Encounter for supervision of normal first pregnancy; History of genital herpes complicating pregnancy; Left flank pain; Left lower quadrant abdominal pain affecting pregnancy; History of nephrolithiasis; Fall due to stumbling; and Gestational hypertension, third trimester on their problem list.  Patient reports no complaints.  Contractions: Not present. Vag. Bleeding: None.  Movement: Present. Denies leaking of fluid.   The following portions of the patient's history were reviewed and updated as appropriate: allergies, current medications, past family history, past medical history, past social history, past surgical history and problem list.   Objective:   Vitals:   06/13/21 1433 06/13/21 1454  BP: (!) 142/82 129/90  Pulse: (!) 118 (!) 120  Weight: 193 lb (87.5 kg)     Fetal Status: Fetal Heart Rate (bpm): 150 Fundal Height: 38 cm Movement: Present  Presentation: Vertex  General:  Alert, oriented and cooperative. Patient is in no acute distress.  Skin: Skin is warm and dry. No rash noted.   Cardiovascular: Normal heart rate noted  Respiratory: Normal respiratory effort, no problems with respiration noted  Abdomen: Soft, gravid, appropriate for gestational age.  Pain/Pressure: Absent     Pelvic: Cervical exam performed in the presence of a chaperone Dilation: Closed Effacement (%): Thick    Extremities: Normal range of motion.  Edema: None  Mental Status: Normal mood and affect. Normal behavior. Normal judgment and thought content.  CTAB Normal s1 and s2, no MRGs Gravid, nttp 1+ brachial b/l Assessment and Plan:  Pregnancy: G1P0000 at [redacted]w[redacted]d 1. Gestational hypertension, third  trimester D/w her that based on her BPs today and previously she has gHTN and I recommend delivery today. She states that she has a dermatology appt on weds that was very difficult to schedule and wants to keep that given the change in the skin lesion on her chest. Her ROS is completely negative and her FH is appropriate and her NST is reactive: 150 baseline, +accels, no decel, mod variability with negative toco x 74m  Strict precautions given and will check labs today and set up BPP for tomorrow. Pt has 2345 IOL on 7/13 - Protein / creatinine ratio, urine - CBC - Comprehensive metabolic panel - [place] MFM FETAL BPP WO NON STRESS; Future  2. Skin lesion  Term labor symptoms and general obstetric precautions including but not limited to vaginal bleeding, contractions, leaking of fluid and fetal movement were reviewed in detail with the patient. Please refer to After Visit Summary for other counseling recommendations.   Return in about 1 day (around 06/14/2021) for in person, bp check, bpp with diane.  Future Appointments  Date Time Provider Department Center  06/14/2021  3:15 PM [facility] NST [facility]  06/16/2021 12:00 AM [facility] SCHED ROOM [facility] None    Watkins Bing, MD

## 2021-06-13 NOTE — Progress Notes (Signed)
This visit occurred during the SARS-CoV-2 public health emergency.  Safety protocols were in place, including screening questions prior to the visit, additional usage of staff PPE, and extensive cleaning of exam room while observing appropriate contact time as indicated for disinfecting solutions.  She is cutting on vaping and she isn't smoking.   Pregnant.  38 weeks today.  Safe at home.  No contractions or bleeding.  No leakage of fluid.  + Fetal movement.  She has gyn f/u pending.    Brown macule on the L upper chest wall.  Since 2018.  Gradually larger in the meantime.  She is going to see [facility] dermatology in the near future.  Needed referral.    Meds, vitals, and allergies reviewed.   ROS: Per HPI unless specifically indicated in ROS section   Nad ncat 5x51mm dark macule on the L upper chest wall.   Gravid Rrr but mildly tachy.  She thought pulse elevation was related anxiety re: the OV today.   ctab

## 2021-06-13 NOTE — Patient Instructions (Addendum)
I put in the referral.  Take care.  Glad to see you. Update [place] as needed.    Check your pulse out of clinic.  If persistently above 100 per minute, then update the OB clinic.    No charge for visit.  Please stop by the front on the way out.

## 2021-06-14 ENCOUNTER — Ambulatory Visit (INDEPENDENT_AMBULATORY_CARE_PROVIDER_SITE_OTHER): Payer: BC Managed Care – PPO

## 2021-06-14 ENCOUNTER — Ambulatory Visit (INDEPENDENT_AMBULATORY_CARE_PROVIDER_SITE_OTHER): Payer: BC Managed Care – PPO | Admitting: General Practice

## 2021-06-14 VITALS — BP 131/84 | HR 104

## 2021-06-14 DIAGNOSIS — O133 Gestational [pregnancy-induced] hypertension without significant proteinuria, third trimester: Secondary | ICD-10-CM

## 2021-06-14 LAB — COMPREHENSIVE METABOLIC PANEL
ALT: 11 IU/L (ref 0–32)
AST: 18 IU/L (ref 0–40)
Albumin/Globulin Ratio: 1.4 (ref 1.2–2.2)
Albumin: 3.6 g/dL — ABNORMAL LOW (ref 3.9–5.0)
Alkaline Phosphatase: 185 IU/L — ABNORMAL HIGH (ref 44–121)
BUN/Creatinine Ratio: 13 (ref 9–23)
BUN: 7 mg/dL (ref 6–20)
Bilirubin Total: 0.2 mg/dL (ref 0.0–1.2)
CO2: 18 mmol/L — ABNORMAL LOW (ref 20–29)
Calcium: 9.2 mg/dL (ref 8.7–10.2)
Chloride: 99 mmol/L (ref 96–106)
Creatinine, Ser: 0.52 mg/dL — ABNORMAL LOW (ref 0.57–1.00)
Globulin, Total: 2.6 g/dL (ref 1.5–4.5)
Glucose: 146 mg/dL — ABNORMAL HIGH (ref 65–99)
Potassium: 4.2 mmol/L (ref 3.5–5.2)
Sodium: 133 mmol/L — ABNORMAL LOW (ref 134–144)
Total Protein: 6.2 g/dL (ref 6.0–8.5)
eGFR: 134 mL/min/{1.73_m2} (ref >59)

## 2021-06-14 LAB — PROTEIN / CREATININE RATIO, URINE
Creatinine, Urine: 98.7 mg/dL
Protein, Ur: 18.9 mg/dL
Protein/Creat Ratio: 191 mg/g creat (ref 0–200)

## 2021-06-14 NOTE — Progress Notes (Signed)
Pt informed that the ultrasound is considered a limited OB ultrasound and is not intended to be a complete ultrasound exam.  Patient also informed that the ultrasound is not being completed with the intent of assessing for fetal or placental anomalies or any pelvic abnormalities.  Explained that the purpose of today's ultrasound is to assess for  BPP, presentation, and AFI.  Patient acknowledges the purpose of the exam and the limitations of the study.     NST not performed today as patient had reactive NST yesterday at [facility] f/u.  Chase Caller RN BSN 06/14/21

## 2021-06-15 ENCOUNTER — Encounter (HOSPITAL_COMMUNITY): Payer: Self-pay | Admitting: Family Medicine

## 2021-06-15 ENCOUNTER — Other Ambulatory Visit: Payer: Self-pay | Admitting: Family Medicine

## 2021-06-15 ENCOUNTER — Telehealth: Payer: Self-pay | Admitting: Primary Care

## 2021-06-15 ENCOUNTER — Encounter (HOSPITAL_COMMUNITY): Payer: Self-pay

## 2021-06-15 DIAGNOSIS — R000 Tachycardia, unspecified: Secondary | ICD-10-CM

## 2021-06-15 DIAGNOSIS — D485 Neoplasm of uncertain behavior of skin: Secondary | ICD-10-CM | POA: Diagnosis not present

## 2021-06-15 DIAGNOSIS — L910 Hypertrophic scar: Secondary | ICD-10-CM | POA: Diagnosis not present

## 2021-06-15 HISTORY — DX: Tachycardia, unspecified: R00.0

## 2021-06-15 NOTE — Patient Instructions (Signed)
Judy Miller  06/15/2021   Your procedure is scheduled on:  06/16/2021  Arrive at 0930 at Entrance C on [address] at [facility]  and [employer]. You are invited to use the FREE valet parking or use the Visitor's parking deck.  Pick up the phone at the desk and dial [phone].  Call this number if you have problems the morning of surgery: [phone]  Remember:   Do not eat food:(After Midnight) Desps de medianoche.  Do not drink clear liquids: (After Midnight) Desps de medianoche.  Take these medicines the morning of surgery with A SIP OF WATER:  Take your valtrex as prescribed   Do not wear jewelry, make-up or nail polish.  Do not wear lotions, powders, or perfumes. Do not wear deodorant.  Do not shave 48 hours prior to surgery.  Do not bring valuables to the hospital.  [facility] is not   responsible for any belongings or valuables brought to the hospital.  Contacts, dentures or bridgework may not be worn into surgery.  Leave suitcase in the car. After surgery it may be brought to your room.  For patients admitted to the hospital, checkout time is 11:00 AM the day of              discharge.      Please read over the following fact sheets that you were given:     Preparing for Surgery

## 2021-06-15 NOTE — Telephone Encounter (Signed)
Pt called she is being induced tonight. She has an appt with [facility] today at 1:20 but they told her that they haven't received the referral  from [place].

## 2021-06-15 NOTE — Assessment & Plan Note (Signed)
Refer to dermatology.  No charge for visit.  She will update [place] as needed.

## 2021-06-15 NOTE — Progress Notes (Signed)
Patient with acute labial HSV outbreak. Took valayclovir  Was IOL at midnight 7/14,  Changed to earliest C-section slot of 06/16/21 at 12:30 PM

## 2021-06-15 NOTE — Assessment & Plan Note (Addendum)
She sounds tachycardic but still regular and this could be anxiety related.  She is going to follow-up with OB/GYN today and recheck her pulse there.  We talked about having her check her pulse out of the clinic in the meantime.  Still okay for outpatient follow-up and not in distress.

## 2021-06-16 ENCOUNTER — Encounter (HOSPITAL_COMMUNITY): Payer: Self-pay | Admitting: Family Medicine

## 2021-06-16 ENCOUNTER — Inpatient Hospital Stay (HOSPITAL_COMMUNITY)
Admission: AD | Admit: 2021-06-16 | Discharge: 2021-06-18 | DRG: 787 | Disposition: A | Discharge status: Home / Self Care | Payer: BC Managed Care – PPO | Attending: Family Medicine | Admitting: Family Medicine

## 2021-06-16 ENCOUNTER — Inpatient Hospital Stay (HOSPITAL_COMMUNITY): Payer: BC Managed Care – PPO

## 2021-06-16 ENCOUNTER — Inpatient Hospital Stay (HOSPITAL_COMMUNITY): Payer: BC Managed Care – PPO | Admitting: Anesthesiology

## 2021-06-16 ENCOUNTER — Encounter (HOSPITAL_COMMUNITY)
Admission: AD | Disposition: A | Discharge status: Home / Self Care | Payer: Self-pay | Source: Home / Self Care | Attending: Family Medicine

## 2021-06-16 ENCOUNTER — Other Ambulatory Visit: Payer: Self-pay

## 2021-06-16 DIAGNOSIS — O9832 Other infections with a predominantly sexual mode of transmission complicating childbirth: Secondary | ICD-10-CM | POA: Diagnosis not present

## 2021-06-16 DIAGNOSIS — O134 Gestational [pregnancy-induced] hypertension without significant proteinuria, complicating childbirth: Secondary | ICD-10-CM | POA: Diagnosis not present

## 2021-06-16 DIAGNOSIS — F32A Depression, unspecified: Secondary | ICD-10-CM | POA: Diagnosis present

## 2021-06-16 DIAGNOSIS — Z98891 History of uterine scar from previous surgery: Secondary | ICD-10-CM

## 2021-06-16 DIAGNOSIS — Z3A38 38 weeks gestation of pregnancy: Secondary | ICD-10-CM

## 2021-06-16 DIAGNOSIS — F17290 Nicotine dependence, other tobacco product, uncomplicated: Secondary | ICD-10-CM | POA: Diagnosis not present

## 2021-06-16 DIAGNOSIS — O99334 Smoking (tobacco) complicating childbirth: Secondary | ICD-10-CM | POA: Diagnosis present

## 2021-06-16 DIAGNOSIS — Z8619 Personal history of other infectious and parasitic diseases: Secondary | ICD-10-CM | POA: Diagnosis present

## 2021-06-16 DIAGNOSIS — O9902 Anemia complicating childbirth: Secondary | ICD-10-CM | POA: Diagnosis not present

## 2021-06-16 DIAGNOSIS — O99892 Other specified diseases and conditions complicating childbirth: Secondary | ICD-10-CM | POA: Diagnosis present

## 2021-06-16 DIAGNOSIS — O9852 Other viral diseases complicating childbirth: Secondary | ICD-10-CM | POA: Diagnosis not present

## 2021-06-16 DIAGNOSIS — Z20822 Contact with and (suspected) exposure to covid-19: Secondary | ICD-10-CM | POA: Diagnosis present

## 2021-06-16 DIAGNOSIS — R000 Tachycardia, unspecified: Secondary | ICD-10-CM | POA: Diagnosis present

## 2021-06-16 DIAGNOSIS — A6009 Herpesviral infection of other urogenital tract: Secondary | ICD-10-CM | POA: Diagnosis present

## 2021-06-16 DIAGNOSIS — F909 Attention-deficit hyperactivity disorder, unspecified type: Secondary | ICD-10-CM | POA: Diagnosis present

## 2021-06-16 DIAGNOSIS — A6000 Herpesviral infection of urogenital system, unspecified: Secondary | ICD-10-CM | POA: Diagnosis not present

## 2021-06-16 DIAGNOSIS — O34211 Maternal care for low transverse scar from previous cesarean delivery: Secondary | ICD-10-CM | POA: Diagnosis not present

## 2021-06-16 DIAGNOSIS — F419 Anxiety disorder, unspecified: Secondary | ICD-10-CM | POA: Diagnosis present

## 2021-06-16 DIAGNOSIS — Z3400 Encounter for supervision of normal first pregnancy, unspecified trimester: Secondary | ICD-10-CM

## 2021-06-16 DIAGNOSIS — O133 Gestational [pregnancy-induced] hypertension without significant proteinuria, third trimester: Secondary | ICD-10-CM | POA: Diagnosis present

## 2021-06-16 HISTORY — DX: History of uterine scar from previous surgery: Z98.891

## 2021-06-16 LAB — TYPE AND SCREEN
ABO/RH(D): B POS
Antibody Screen: NEGATIVE

## 2021-06-16 LAB — RESP PANEL BY RT-PCR (FLU A&B, COVID) ARPGX2
Influenza A by PCR: NEGATIVE
Influenza B by PCR: NEGATIVE
SARS Coronavirus 2 by RT PCR: NEGATIVE

## 2021-06-16 SURGERY — Surgical Case
Anesthesia: Spinal

## 2021-06-16 MED ORDER — SOD CITRATE-CITRIC ACID 500-334 MG/5ML PO SOLN
ORAL | Status: AC
  Filled 2021-06-16: qty 30

## 2021-06-16 MED ORDER — ONDANSETRON HCL 4 MG/2ML IJ SOLN
INTRAMUSCULAR | Status: DC | PRN
  Administered 2021-06-16: 4 mg via INTRAVENOUS

## 2021-06-16 MED ORDER — PHENYLEPHRINE HCL-NACL 20-0.9 MG/250ML-% IV SOLN
INTRAVENOUS | Status: AC
  Filled 2021-06-16: qty 250

## 2021-06-16 MED ORDER — SOD CITRATE-CITRIC ACID 500-334 MG/5ML PO SOLN
30.0000 mL | ORAL | Status: AC
  Administered 2021-06-16: 30 mL via ORAL

## 2021-06-16 MED ORDER — MORPHINE SULFATE (PF) 0.5 MG/ML IJ SOLN
INTRAMUSCULAR | Status: AC
  Filled 2021-06-16: qty 10

## 2021-06-16 MED ORDER — BUPIVACAINE IN DEXTROSE 0.75-8.25 % IT SOLN
INTRATHECAL | Status: DC | PRN
  Administered 2021-06-16: 1.5 mL via INTRATHECAL

## 2021-06-16 MED ORDER — DEXAMETHASONE SODIUM PHOSPHATE 10 MG/ML IJ SOLN
INTRAMUSCULAR | Status: AC
  Filled 2021-06-16: qty 1

## 2021-06-16 MED ORDER — ONDANSETRON HCL 4 MG/2ML IJ SOLN: 4.0000 mg | Freq: Three times a day (TID) | INTRAMUSCULAR | Status: DC | PRN

## 2021-06-16 MED ORDER — NALOXONE HCL 0.4 MG/ML IJ SOLN: 0.4000 mg | INTRAMUSCULAR | Status: DC | PRN

## 2021-06-16 MED ORDER — ZOLPIDEM TARTRATE 5 MG PO TABS: 5.0000 mg | ORAL_TABLET | Freq: Every evening | ORAL | Status: DC | PRN

## 2021-06-16 MED ORDER — TRANEXAMIC ACID-NACL 1000-0.7 MG/100ML-% IV SOLN
INTRAVENOUS | Status: DC | PRN
  Administered 2021-06-16: 1000 mg via INTRAVENOUS

## 2021-06-16 MED ORDER — DIPHENHYDRAMINE HCL 25 MG PO CAPS: 25.0000 mg | ORAL_CAPSULE | ORAL | Status: DC | PRN

## 2021-06-16 MED ORDER — FENTANYL CITRATE (PF) 100 MCG/2ML IJ SOLN
INTRAMUSCULAR | Status: DC | PRN
  Administered 2021-06-16: 15 ug via INTRATHECAL

## 2021-06-16 MED ORDER — HYDROMORPHONE HCL 1 MG/ML IJ SOLN: 0.2500 mg | INTRAMUSCULAR | Status: DC | PRN

## 2021-06-16 MED ORDER — ACETAMINOPHEN 500 MG PO TABS
1000.0000 mg | ORAL_TABLET | Freq: Four times a day (QID) | ORAL | Status: DC
  Administered 2021-06-16 – 2021-06-18 (×8): 1000 mg via ORAL
  Filled 2021-06-16 (×8): qty 2

## 2021-06-16 MED ORDER — TRANEXAMIC ACID-NACL 1000-0.7 MG/100ML-% IV SOLN
INTRAVENOUS | Status: AC
  Filled 2021-06-16: qty 100

## 2021-06-16 MED ORDER — DIPHENHYDRAMINE HCL 50 MG/ML IJ SOLN: 12.5000 mg | INTRAMUSCULAR | Status: DC | PRN

## 2021-06-16 MED ORDER — SODIUM CHLORIDE 0.9 % IV SOLN: INTRAVENOUS | Status: DC | PRN

## 2021-06-16 MED ORDER — MEPERIDINE HCL 25 MG/ML IJ SOLN: 6.2500 mg | INTRAMUSCULAR | Status: DC | PRN

## 2021-06-16 MED ORDER — PROMETHAZINE HCL 25 MG/ML IJ SOLN: 6.2500 mg | INTRAMUSCULAR | Status: DC | PRN

## 2021-06-16 MED ORDER — LACTATED RINGERS IV SOLN: INTRAVENOUS | Status: DC

## 2021-06-16 MED ORDER — CEFAZOLIN SODIUM-DEXTROSE 2-4 GM/100ML-% IV SOLN
INTRAVENOUS | Status: AC
  Filled 2021-06-16: qty 100

## 2021-06-16 MED ORDER — PRENATAL MULTIVITAMIN CH
1.0000 | ORAL_TABLET | Freq: Every day | ORAL | Status: DC
  Administered 2021-06-17 – 2021-06-18 (×2): 1 via ORAL
  Filled 2021-06-16 (×2): qty 1

## 2021-06-16 MED ORDER — SIMETHICONE 80 MG PO CHEW: 80.0000 mg | CHEWABLE_TABLET | ORAL | Status: DC | PRN

## 2021-06-16 MED ORDER — SIMETHICONE 80 MG PO CHEW
80.0000 mg | CHEWABLE_TABLET | Freq: Three times a day (TID) | ORAL | Status: DC
  Administered 2021-06-16 – 2021-06-18 (×5): 80 mg via ORAL
  Filled 2021-06-16 (×5): qty 1

## 2021-06-16 MED ORDER — WITCH HAZEL-GLYCERIN EX PADS: 1.0000 "application " | MEDICATED_PAD | CUTANEOUS | Status: DC | PRN

## 2021-06-16 MED ORDER — NALBUPHINE HCL 10 MG/ML IJ SOLN: 5.0000 mg | Freq: Once | INTRAMUSCULAR | Status: DC | PRN

## 2021-06-16 MED ORDER — DEXAMETHASONE SODIUM PHOSPHATE 10 MG/ML IJ SOLN
INTRAMUSCULAR | Status: DC | PRN
  Administered 2021-06-16: 10 mg via INTRAVENOUS

## 2021-06-16 MED ORDER — NALBUPHINE HCL 10 MG/ML IJ SOLN: 5.0000 mg | INTRAMUSCULAR | Status: DC | PRN

## 2021-06-16 MED ORDER — MORPHINE SULFATE (PF) 0.5 MG/ML IJ SOLN
INTRAMUSCULAR | Status: DC | PRN
  Administered 2021-06-16: 150 ug via INTRATHECAL

## 2021-06-16 MED ORDER — SODIUM CHLORIDE 0.9% FLUSH: 3.0000 mL | INTRAVENOUS | Status: DC | PRN

## 2021-06-16 MED ORDER — SCOPOLAMINE 1 MG/3DAYS TD PT72
1.0000 | MEDICATED_PATCH | Freq: Once | TRANSDERMAL | Status: DC
  Administered 2021-06-16: 1.5 mg via TRANSDERMAL

## 2021-06-16 MED ORDER — MENTHOL 3 MG MT LOZG: 1.0000 | LOZENGE | OROMUCOSAL | Status: DC | PRN

## 2021-06-16 MED ORDER — KETOROLAC TROMETHAMINE 30 MG/ML IJ SOLN
INTRAMUSCULAR | Status: AC
  Filled 2021-06-16: qty 1

## 2021-06-16 MED ORDER — DIPHENHYDRAMINE HCL 25 MG PO CAPS: 25.0000 mg | ORAL_CAPSULE | Freq: Four times a day (QID) | ORAL | Status: DC | PRN

## 2021-06-16 MED ORDER — COCONUT OIL OIL: 1.0000 "application " | TOPICAL_OIL | Status: DC | PRN

## 2021-06-16 MED ORDER — KETOROLAC TROMETHAMINE 30 MG/ML IJ SOLN
30.0000 mg | Freq: Once | INTRAMUSCULAR | Status: AC | PRN
Start: 2021-06-16 — End: 2021-06-16
  Administered 2021-06-16: 30 mg via INTRAVENOUS

## 2021-06-16 MED ORDER — SENNOSIDES-DOCUSATE SODIUM 8.6-50 MG PO TABS
2.0000 | ORAL_TABLET | ORAL | Status: DC
  Administered 2021-06-18: 2 via ORAL
  Filled 2021-06-16 (×2): qty 2

## 2021-06-16 MED ORDER — PHENYLEPHRINE HCL-NACL 20-0.9 MG/250ML-% IV SOLN
INTRAVENOUS | Status: DC | PRN
  Administered 2021-06-16: 60 ug/min via INTRAVENOUS

## 2021-06-16 MED ORDER — OXYTOCIN-SODIUM CHLORIDE 30-0.9 UT/500ML-% IV SOLN: 2.5000 [IU]/h | INTRAVENOUS | Status: AC

## 2021-06-16 MED ORDER — DIBUCAINE (PERIANAL) 1 % EX OINT: 1.0000 "application " | TOPICAL_OINTMENT | CUTANEOUS | Status: DC | PRN

## 2021-06-16 MED ORDER — SCOPOLAMINE 1 MG/3DAYS TD PT72
MEDICATED_PATCH | TRANSDERMAL | Status: AC
  Filled 2021-06-16: qty 1

## 2021-06-16 MED ORDER — NALOXONE HCL 4 MG/10ML IJ SOLN
1.0000 ug/kg/h | INTRAVENOUS | Status: DC | PRN
  Filled 2021-06-16: qty 5

## 2021-06-16 MED ORDER — CEFAZOLIN SODIUM-DEXTROSE 2-4 GM/100ML-% IV SOLN
2.0000 g | INTRAVENOUS | Status: AC
  Administered 2021-06-16: 2 g via INTRAVENOUS

## 2021-06-16 MED ORDER — OXYTOCIN-SODIUM CHLORIDE 30-0.9 UT/500ML-% IV SOLN
INTRAVENOUS | Status: AC
  Filled 2021-06-16: qty 500

## 2021-06-16 MED ORDER — TETANUS-DIPHTH-ACELL PERTUSSIS 5-2.5-18.5 LF-MCG/0.5 IM SUSY: 0.5000 mL | PREFILLED_SYRINGE | Freq: Once | INTRAMUSCULAR | Status: DC

## 2021-06-16 MED ORDER — OXYTOCIN-SODIUM CHLORIDE 30-0.9 UT/500ML-% IV SOLN
INTRAVENOUS | Status: DC | PRN
  Administered 2021-06-16: 30 [IU] via INTRAVENOUS

## 2021-06-16 MED ORDER — FENTANYL CITRATE (PF) 100 MCG/2ML IJ SOLN
INTRAMUSCULAR | Status: AC
  Filled 2021-06-16: qty 2

## 2021-06-16 MED ORDER — ONDANSETRON HCL 4 MG/2ML IJ SOLN
INTRAMUSCULAR | Status: AC
  Filled 2021-06-16: qty 2

## 2021-06-16 MED ORDER — OXYCODONE HCL 5 MG PO TABS
5.0000 mg | ORAL_TABLET | ORAL | Status: DC | PRN
  Administered 2021-06-17: 5 mg via ORAL
  Administered 2021-06-17 – 2021-06-18 (×2): 10 mg via ORAL
  Filled 2021-06-16 (×2): qty 2
  Filled 2021-06-16: qty 1

## 2021-06-16 SURGICAL SUPPLY — 27 items
BENZOIN TINCTURE PRP APPL 2/3 (GAUZE/BANDAGES/DRESSINGS) ×2 IMPLANT
CHLORAPREP W/TINT 26ML (MISCELLANEOUS) ×2 IMPLANT
CLOTH BEACON ORANGE TIMEOUT ST (SAFETY) ×2 IMPLANT
DERMABOND ADHESIVE PROPEN (GAUZE/BANDAGES/DRESSINGS) ×1
DERMABOND ADVANCED .7 DNX6 (GAUZE/BANDAGES/DRESSINGS) ×1 IMPLANT
DRSG OPSITE POSTOP 4X10 (GAUZE/BANDAGES/DRESSINGS) ×2 IMPLANT
ELECT REM PT RETURN 9FT ADLT (ELECTROSURGICAL) ×2
ELECTRODE REM PT RTRN 9FT ADLT (ELECTROSURGICAL) ×1 IMPLANT
GLOVE BIOGEL PI IND STRL 7.0 (GLOVE) ×3 IMPLANT
GLOVE BIOGEL PI INDICATOR 7.0 (GLOVE) ×3
GLOVE ECLIPSE 6.5 STRL STRAW (GLOVE) ×2 IMPLANT
GOWN STRL REUS W/ TWL LRG LVL3 (GOWN DISPOSABLE) ×2 IMPLANT
GOWN STRL REUS W/TWL LRG LVL3 (GOWN DISPOSABLE) ×2
NS IRRIG 1000ML POUR BTL (IV SOLUTION) ×2 IMPLANT
PAD OB MATERNITY 4.3X12.25 (PERSONAL CARE ITEMS) ×2 IMPLANT
PAD PREP 24X48 CUFFED NSTRL (MISCELLANEOUS) ×2 IMPLANT
RETRACTOR WND ALEXIS 25 LRG (MISCELLANEOUS) IMPLANT
RTRCTR WOUND ALEXIS 25CM LRG (MISCELLANEOUS)
STRIP CLOSURE SKIN 1/2X4 (GAUZE/BANDAGES/DRESSINGS) ×2 IMPLANT
SUT PLAIN 2 0 XLH (SUTURE) ×2 IMPLANT
SUT VIC AB 0 CT1 36 (SUTURE) ×4 IMPLANT
SUT VIC AB 2-0 CT1 27 (SUTURE) ×1
SUT VIC AB 2-0 CT1 TAPERPNT 27 (SUTURE) ×1 IMPLANT
SUT VIC AB 4-0 KS 27 (SUTURE) ×2 IMPLANT
TOWEL OR 17X24 6PK STRL BLUE (TOWEL DISPOSABLE) ×6 IMPLANT
TRAY FOLEY CATH SILVER 16FR (SET/KITS/TRAYS/PACK) ×2 IMPLANT
WATER STERILE IRR 1000ML POUR (IV SOLUTION) ×2 IMPLANT

## 2021-06-16 NOTE — Op Note (Signed)
Judy Miller PROCEDURE DATE: 06/16/2021  PREOPERATIVE DIAGNOSES: Intrauterine pregnancy at [redacted]w[redacted]d weeks gestation;  GHTN with active HSV outbreak  POSTOPERATIVE DIAGNOSES: The same  PROCEDURE: Primary Low Transverse Cesarean Section  SURGEON:  Federico Flake   ASSISTANT:    ANESTHESIOLOGY TEAM: Anesthesiologist: Lewie Loron, MD CRNA: Dairl Ponder, CRNA  INDICATIONS: Judy Miller is a 25 y.o. G1P0000 at [redacted]w[redacted]d here for cesarean section secondary to the indications listed under preoperative diagnoses; please see preoperative note for further details.  The risks of cesarean section were discussed with the patient including but were not limited to: bleeding which may require transfusion or reoperation; infection which may require antibiotics; injury to bowel, bladder, ureters or other surrounding organs; injury to the fetus; need for additional procedures including hysterectomy in the event of a life-threatening hemorrhage; placental abnormalities wth subsequent pregnancies, incisional problems, thromboembolic phenomenon and other postoperative/anesthesia complications.   The patient concurred with the proposed plan, giving informed written consent for the procedure.    FINDINGS:  Viable female infant in cephalic presentation.   APGAR (5 MINS):  9 APGAR (10 MINS):  9 Clear amniotic fluid.  Intact placenta, three vessel cord.  Normal uterus, fallopian tubes and ovaries bilaterally.  ANESTHESIA: Spinal INTRAVENOUS FLUIDS: 950 ml   ESTIMATED BLOOD LOSS: 380 ml URINE OUTPUT:  150 ml SPECIMENS: Placenta sent to L&D COMPLICATIONS: None immediate  PROCEDURE IN DETAIL:  The patient preoperatively received intravenous antibiotics and had sequential compression devices applied to her lower extremities.  She was then taken to the operating room where spinal anesthesia was administered and was found to be adequate. She was then placed in a dorsal supine position with a  leftward tilt, and prepped and draped in a sterile manner.  A foley catheter was placed into her bladder and attached to constant gravity.  After an adequate timeout was performed, a Pfannenstiel skin incision was made with scalpel and carried through to the underlying layer of fascia. The fascia was incised in the midline, and this incision was extended bilaterally using the Mayo scissors.  Kocher clamps were applied to the superior aspect of the fascial incision and the underlying rectus muscles were dissected off bluntly and sharply.  A similar process was carried out on the inferior aspect of the fascial incision. The rectus muscles were separated in the midline and the peritoneum was entered bluntly. The Alexis self-retaining retractor was introduced into the abdominal cavity.  Attention was turned to the lower uterine segment where a low transverse hysterotomy was made with a scalpel and extended bilaterally bluntly.  The infant was successfully delivered in the typical fashion gently through hysterotomy using clear drape- mother and father interacted with infant through drape, the cord was clamped and cut after one minute, and the infant was handed over to the awaiting neonatology team. Uterine massage was then administered, and the placenta delivered intact with a three-vessel cord. The uterus was then cleared of clots and debris.  The hysterotomy was closed with 0 Vicryl in a running locked fashion, and an imbricating layer was also placed with 0 Vicryl.  Figure-of-eight 0 Vicryl serosal stitches were placed to help with hemostasis along the left side of the incision.  The pelvis was cleared of all clot and debris. Hemostasis was confirmed on all surfaces.  The retractor was removed.  The peritoneum was closed with a 0 Vicryl running stitch and the rectus muscles were reapproximated using 0 Vicryl interrupted stitches. The fascia was then closed using 0  Vicryl.  The subcutaneous layer was irrigated and the  skin was closed with a 4-0 Vicryl subcuticular stitch. The patient tolerated the procedure well. Sponge, instrument and needle counts were correct x 3.  Dermabond applied to incision.  She was taken to the recovery room in stable condition.   Federico Flake, MD [facility] for [facility]

## 2021-06-16 NOTE — Anesthesia Preprocedure Evaluation (Addendum)
Anesthesia Evaluation  Patient identified by MRN, date of birth, ID band Patient awake    Reviewed: Allergy & Precautions, Patient's Chart, lab work & pertinent test results  Airway Mallampati: II  TM Distance: >3 FB Neck ROM: Full    Dental no notable dental hx. (+) Dental Advisory Given, Teeth Intact   Pulmonary neg pulmonary ROS, Current Smoker,    Pulmonary exam normal breath sounds clear to auscultation       Cardiovascular hypertension, Normal cardiovascular exam Rhythm:Regular Rate:Normal     Neuro/Psych PSYCHIATRIC DISORDERS Anxiety Depression negative neurological ROS     GI/Hepatic negative GI ROS, Neg liver ROS,   Endo/Other  negative endocrine ROS  Renal/GU Renal disease     Musculoskeletal  (+) Arthritis ,   Abdominal   Peds  Hematology negative hematology ROS (+)   Anesthesia Other Findings   Reproductive/Obstetrics                           Anesthesia Physical Anesthesia Plan  ASA: 2  Anesthesia Plan: Spinal   Post-op Pain Management:    Induction:   PONV Risk Score and Plan: 3 and Ondansetron, Dexamethasone, Scopolamine patch - Pre-op and Treatment may vary due to age or medical condition  Airway Management Planned: Natural Airway  Additional Equipment: None  Intra-op Plan:   Post-operative Plan:   Informed Consent: I have reviewed the patients History and Physical, chart, labs and discussed the procedure including the risks, benefits and alternatives for the proposed anesthesia with the patient or authorized representative who has indicated his/her understanding and acceptance.     Dental advisory given  Plan Discussed with: CRNA  Anesthesia Plan Comments:         Anesthesia Quick Evaluation

## 2021-06-16 NOTE — H&P (Signed)
Obstetric Preoperative History and Physical  Samatha Jolanta Cabeza is a 25 y.o. G1P0000 with IUP at 75w3dpresenting for presenting for scheduled cesarean section due to gHTN in the setting of recent HSV outbreak.  No acute concerns.   [facility] Pregnancy complications or risks: Patient Active Problem List   Diagnosis Date Noted   S/P primary low transverse C-section 06/16/2021   Tachycardia 06/15/2021   Gestational hypertension, third trimester 06/13/2021   Fall due to stumbling 03/20/2021   History of nephrolithiasis 03/07/2021   Left flank pain 02/22/2021   Left lower quadrant abdominal pain affecting pregnancy 02/22/2021   Encounter for supervision of normal first pregnancy 12/13/2020   History of genital herpes complicating pregnancy 030/86/5784  Anxiety and depression 05/12/2015   Skin lesion 02/28/2012   ADHD (attention deficit hyperactivity disorder)    She plans to breastfeed She desires oral progesterone-only contraceptive for postpartum contraception.   Prenatal labs and studies: ABO, Rh: B/Positive/-- (01/10 1501) Antibody: Negative (01/10 1501) Rubella: 4.48 (01/10 1501) RPR: Non Reactive (04/26 0851)  HBsAg: Negative (01/10 1501)  HIV: Non Reactive (04/26 0851)  GONG:EXBMWUXL/- (06/27 1414) Genetic screening normal Anatomy UKoreanormal  Prenatal Transfer Tool  Maternal Diabetes: No Genetic Screening: Normal Maternal Ultrasounds/Referrals: Normal Fetal Ultrasounds or other Referrals:  None Maternal Substance Abuse:  No Significant Maternal Medications:  None Significant Maternal Lab Results: Group B Strep negative  Past Medical History:  Diagnosis Date   Acne vulgaris 05/12/2015   ADHD (attention deficit hyperactivity disorder)    Anxiety    Arthritis    history of this in her knees   Depression    Depression, major, single episode, mild ([place] 04/23/2013   Generalized anxiety disorder  11/06/2013   Genital herpes 11/2017   pos HSV 2 culture   Gestational hypertension, third trimester 06/13/2021   Renal stone 01/21/2019   Rhinitis, allergic 11/10/2015   Vaccine for human papilloma virus (HPV) types 6, 11, 16, and 18 administered     History reviewed. No pertinent surgical history.  OB History  Gravida Para Term Preterm AB Living  1 0 0 0 0 0  SAB IAB Ectopic Multiple Live Births  0 0 0 0 0    # Outcome Date GA Lbr Len/2nd Weight Sex Delivery Anes PTL Lv  1 Current             Social History   Socioeconomic History   Marital status: Single    Spouse name: Not on file   Number of children: Not on file   Years of education: Not on file   Highest education level: Not on file  Occupational History   Not on file  Tobacco Use   Smoking status: Every Day    Types: E-cigarettes   Smokeless tobacco: Never  Vaping Use   Vaping Use: Every day   Substances: Nicotine  Substance and Sexual Activity   Alcohol use: Yes    Alcohol/week: 0.0 standard drinks    Comment: barely   Drug use: No    Comment: history of    Sexual activity: Yes    Birth control/protection: None  Other Topics Concern   Not on file  Social History Narrative   Single.   Graduated from HS in 2017.    Student in cosmetology school.   Enjoys spending time outdoors, playing with her dog.   Social Determinants of HRadio broadcast assistant  Strain: Not on file  Food Insecurity: No Food Insecurity   Worried About Charity fundraiser in the Last Year: Never true   Ran Out of Food in the Last Year: Never true  Transportation Needs: No Transportation Needs   Lack of Transportation (Medical): No   Lack of Transportation (Non-Medical): No  Physical Activity: Not on file  Stress: Not on file  Social Connections: Not on file    Family History  Problem Relation Age of Onset   Arthritis Mother    Varicose Veins Maternal Grandmother    Heart disease Paternal Grandmother    Varicose Veins  Paternal Grandmother     Medications Prior to Admission  Medication Sig Dispense Refill Last Dose   Prenatal Vit-Fe Fumarate-FA (MULTIVITAMIN-PRENATAL) 27-0.8 MG TABS tablet Take 1 tablet by mouth daily at 12 noon.   06/15/2021   valACYclovir (VALTREX) 1000 MG tablet Take 1 tablet (1,000 mg total) by mouth daily. 60 tablet 3 06/16/2021    Allergies  Allergen Reactions   Cod [Fish Allergy] Hives    Grouper fish   Fish-Derived Products Hives    Grouper fish   Other Hives and Other (See Comments)    Red hair dye Crab legs Grouper fish   Shellfish Allergy Other (See Comments)    Crab legs     Review of Systems: Negative except for what is mentioned in HPI.  Physical Exam: BP (!) 148/96   Pulse 98   Temp 98.5 F (36.9 C) (Oral)   Resp 18   Ht 5' 6"  (1.676 m)   Wt 87.5 kg   LMP 09/27/2020 (Approximate)   SpO2 100%   BMI 31.15 kg/m  FHR by Doppler: 148 bpm CONSTITUTIONAL: Well-developed, well-nourished female in no acute distress.  HENT:  Normocephalic, atraumatic, External right and left ear normal. Oropharynx is clear and moist EYES: Conjunctivae and EOM are normal. Pupils are equal, round, and reactive to light. No scleral icterus.  NECK: Normal range of motion, supple, no masses SKIN: Skin is warm and dry. No rash noted. Not diaphoretic. No erythema. No pallor. Sublimity: Alert and oriented to person, place, and time. Normal reflexes, muscle tone coordination. No cranial nerve deficit noted. PSYCHIATRIC: Normal mood and affect. Normal behavior. Normal judgment and thought content. CARDIOVASCULAR: Normal heart rate noted, regular rhythm RESPIRATORY: Effort and breath sounds normal, no problems with respiration noted ABDOMEN: Soft, nontender, nondistended, gravid.  PELVIC: Deferred MUSCULOSKELETAL: Normal range of motion. No edema and no tenderness. 2+ distal pulses.   Pertinent Labs/Studies:   Results for orders placed or performed in visit on 06/13/21 (from the past  72 hour(s))  Protein / creatinine ratio, urine     Status: None   Collection Time: 06/13/21  3:21 PM  Result Value Ref Range   Creatinine, Urine 98.7 Not Estab. mg/dL   Protein, Ur 18.9 Not Estab. mg/dL   Protein/Creat Ratio 191 0 - 200 mg/g creat  CBC     Status: Abnormal   Collection Time: 06/13/21  3:24 PM  Result Value Ref Range   WBC 12.8 (H) 3.4 - 10.8 x10E3/uL   RBC 4.61 3.77 - 5.28 x10E6/uL   Hemoglobin 11.6 11.1 - 15.9 g/dL   Hematocrit 35.7 34.0 - 46.6 %   MCV 77 (L) 79 - 97 fL   MCH 25.2 (L) 26.6 - 33.0 pg   MCHC 32.5 31.5 - 35.7 g/dL   RDW 14.1 11.7 - 15.4 %   Platelets 239 150 - 450 x10E3/uL  Comprehensive  metabolic panel     Status: Abnormal   Collection Time: 06/13/21  3:27 PM  Result Value Ref Range   Glucose 146 (H) 65 - 99 mg/dL   BUN 7 6 - 20 mg/dL   Creatinine, Ser 0.52 (L) 0.57 - 1.00 mg/dL   eGFR 134 >59 mL/min/1.73   BUN/Creatinine Ratio 13 9 - 23   Sodium 133 (L) 134 - 144 mmol/L   Potassium 4.2 3.5 - 5.2 mmol/L   Chloride 99 96 - 106 mmol/L   CO2 18 (L) 20 - 29 mmol/L   Calcium 9.2 8.7 - 10.2 mg/dL   Total Protein 6.2 6.0 - 8.5 g/dL   Albumin 3.6 (L) 3.9 - 5.0 g/dL   Globulin, Total 2.6 1.5 - 4.5 g/dL   Albumin/Globulin Ratio 1.4 1.2 - 2.2   Bilirubin Total 0.2 0.0 - 1.2 mg/dL   Alkaline Phosphatase 185 (H) 44 - 121 IU/L   AST 18 0 - 40 IU/L   ALT 11 0 - 32 IU/L    Assessment and Plan :Oneal Biglow is a 25 y.o. G1P0000 at 64w3dbeing admitted being admitted for scheduled cesarean section. The risks of cesarean section discussed with the patient included but were not limited to: bleeding which may require transfusion or reoperation; infection which may require antibiotics; injury to bowel, bladder, ureters or other surrounding organs; injury to the fetus; need for additional procedures including hysterectomy in the event of a life-threatening hemorrhage; placental abnormalities wth subsequent pregnancies, incisional problems, thromboembolic  phenomenon and other postoperative/anesthesia complications. The patient concurred with the proposed plan, giving informed written consent for the procedure. Patient has been NPO since last night she will remain NPO for procedure. Anesthesia and OR aware. Preoperative prophylactic antibiotics and SCDs ordered on call to the OR. To OR when ready.    Desires POP for contraception Breastfeeding Discussed TOLAC for next pregnancy  KCaren Macadam MD, MPH, ABFM Attending Physician Center [facility]

## 2021-06-16 NOTE — Anesthesia Postprocedure Evaluation (Signed)
Anesthesia Post Note  Patient: Judy Miller  Procedure(s) Performed: CESAREAN SECTION     Patient location during evaluation: PACU Anesthesia Type: Spinal Level of consciousness: awake and alert Pain management: pain level controlled Vital Signs Assessment: post-procedure vital signs reviewed and stable Respiratory status: spontaneous breathing and respiratory function stable Cardiovascular status: blood pressure returned to baseline and stable Postop Assessment: spinal receding Anesthetic complications: no   No notable events documented.  Last Vitals:  Vitals:   06/16/21 1449 06/16/21 1550  BP: 123/66 128/74  Pulse: 65 71  Resp: 18 18  Temp: (!) 36.4 C 36.4 C  SpO2: 100% 99%    Last Pain:  Vitals:   06/16/21 1550  TempSrc: Oral  PainSc: 0-No pain                 Lewie Loron

## 2021-06-16 NOTE — Anesthesia Procedure Notes (Addendum)
Spinal  Patient location during procedure: OR Start time: 06/16/2021 12:17 PM End time: 06/16/2021 12:23 PM Reason for block: surgical anesthesia Staffing Performed: anesthesiologist  Anesthesiologist: Nolon Nations, MD Preanesthetic Checklist Completed: patient identified, IV checked, site marked, risks and benefits discussed, surgical consent, monitors and equipment checked, pre-op evaluation and timeout performed Spinal Block Patient position: sitting Prep: DuraPrep and site prepped and draped Patient monitoring: heart rate, continuous pulse ox and blood pressure Approach: midline Location: L3-4 Injection technique: single-shot Needle Needle type: Spinocan  Needle gauge: 25 G Needle length: 9 cm Assessment Sensory level: T6 Additional Notes Expiration date of kit checked and confirmed. Patient tolerated procedure well, without complications.

## 2021-06-16 NOTE — Transfer of Care (Signed)
Immediate Anesthesia Transfer of Care Note  Patient: Judy Miller  Procedure(s) Performed: CESAREAN SECTION  Patient Location: PACU  Anesthesia Type:Spinal  Level of Consciousness: awake, alert  and oriented  Airway & Oxygen Therapy: Patient Spontanous Breathing  Post-op Assessment: Report given to RN and Post -op Vital signs reviewed and stable  Post vital signs: Reviewed and stable  Last Vitals:  Vitals Value Taken Time  BP 121/78 06/16/21 1339  Temp    Pulse 96 06/16/21 1342  Resp 17 06/16/21 1342  SpO2 100 % 06/16/21 1342  Vitals shown include unvalidated device data.  Last Pain:  Vitals:   06/16/21 0959  TempSrc: Oral  PainSc: 0-No pain      Patients Stated Pain Goal: 4 (06/16/21 0959)  Complications: No notable events documented.

## 2021-06-16 NOTE — Lactation Note (Signed)
This note was copied from a baby's chart. Lactation Consultation Note  Patient Name: Judy Miller Date: 06/16/2021 Reason for consult: Initial assessment;Primapara;1st time breastfeeding;Difficult latch;Early term 37-38.6wks;Other (Comment) (baby is 4 hours old. MBURN reported to this LC mom has flat nipples , and may need a NS/ DL. see LC note) Age:23 hours LC plan for the next 24 hours - mom aware it may change from day to day due to her challenging tissue for latching - DL .  Breast shells while awake between feedings except when sleeping.  Work on the stimulating the breast tissue with hand expressing and pumping with the DEBP with attempts to latch.  Until the shells start working - prior to attempting to latch - pre -pump both breast 5 mins with the DEBP. ( #24 stimulates the areola better than the #21 - LC checked)  If the areola becomes more compressible attempt to latch the baby, if not latch , spoon feed the baby , as the volume increases finger feed ( parents need to be shown how).  After baby feeds - post pump 15 mins both breast to enhance the milk coming in and stimulate the tissue. / save the milk for the next feeding.  Call for assistance RN or LC.  LC provided the [facility] brochure with BF resources after D/C.  Maternal Data Has patient been taught Hand Expression?: Yes (expressed drops from the right breast / not the left/ challenging tissue for latching.) Does the patient have breastfeeding experience prior to this delivery?: No  Feeding Mother's Current Feeding Choice: Breast Milk  LATCH Score Latch: Repeated attempts needed to sustain latch, nipple held in mouth throughout feeding, stimulation needed to elicit sucking reflex.  Audible Swallowing: None  Type of Nipple: Flat  Comfort (Breast/Nipple): Soft / non-tender  Hold (Positioning): Assistance needed to correctly position infant at breast and maintain latch.  LATCH Score: 5   Lactation Tools  Discussed/Used    Interventions Interventions: Breast feeding basics reviewed;Assisted with latch;Skin to skin;Breast massage;Hand express;Pre-pump if needed;Adjust position;Support pillows;Position options;Hand pump;DEBP  Discharge    Consult Status Consult Status: Follow-up Date: 06/16/21 Follow-up type: In-patient    Judy Miller 06/16/2021, 6:48 PM

## 2021-06-17 LAB — CBC
HCT: 28.3 % — ABNORMAL LOW (ref 36.0–46.0)
Hemoglobin: 9.1 g/dL — ABNORMAL LOW (ref 12.0–15.0)
MCH: 25 pg — ABNORMAL LOW (ref 26.0–34.0)
MCHC: 32.2 g/dL (ref 30.0–36.0)
MCV: 77.7 fL — ABNORMAL LOW (ref 80.0–100.0)
Platelets: 235 10*3/uL (ref 150–400)
RBC: 3.64 MIL/uL — ABNORMAL LOW (ref 3.87–5.11)
RDW: 14.9 % (ref 11.5–15.5)
WBC: 20.8 10*3/uL — ABNORMAL HIGH (ref 4.0–10.5)
nRBC: 0 % (ref 0.0–0.2)

## 2021-06-17 MED ORDER — IBUPROFEN 600 MG PO TABS
600.0000 mg | ORAL_TABLET | Freq: Four times a day (QID) | ORAL | Status: DC | PRN
  Administered 2021-06-18: 600 mg via ORAL
  Filled 2021-06-17: qty 1

## 2021-06-17 MED ORDER — ASCORBIC ACID 500 MG PO TABS
250.0000 mg | ORAL_TABLET | ORAL | Status: DC
  Administered 2021-06-17: 250 mg via ORAL
  Filled 2021-06-17: qty 1

## 2021-06-17 MED ORDER — FERROUS SULFATE 325 (65 FE) MG PO TABS
325.0000 mg | ORAL_TABLET | ORAL | Status: DC
  Administered 2021-06-17: 325 mg via ORAL
  Filled 2021-06-17: qty 1

## 2021-06-17 NOTE — Social Work (Signed)
CSW received consult for hx of Anxiety and Depression.  CSW met with MOB to offer support and complete assessment.    CSW met with MOB at bedside. CSW observed FOB swaddling infant and MOB lying in bed. CSW offered MOB privacy. MOB preferred that FOB stay. MOB presented calm and welcomed CSW visit. CSW confirmed the demographic information on file is correct. CSW inquired how MOB has felt since giving birth. MOB expressed, " I feel good but sore because I had a c-section." CSW asked MOB how she felt during her pregnancy. MOB reported that she had a good pregnancy no complications and felt pretty good emotionally. CSW inquired about MOB history of anxiety and depression. MOB acknowledged that she has depression, anxiety and was diagnosed while in high school. MOB reported she started taking Celexa for her symptoms five days before learning she was pregnant and then quit taking it. MOB reported that she does not plan to resume the medication. MOB reported she is managing well emotionally. CSW inquired about MOB coping mechanisms. MOB disclosed that she cries and will "hold it all in."  CSW encouraged MOB to not hold it in but talk about it and ask for help and support when needed especially during this time. MOB reported understanding. CSW asked MOB about her supports. MOB acknowledged her dad, significant other, uncle, brothers, in laws and friends as supports.  CSW provided education regarding the baby blues period vs. perinatal mood disorders, discussed treatment and gave resources for mental health follow up if concerns arise.  CSW recommended MOB complete a self-evaluation during the postpartum time period using the New Mom Checklist from Postpartum Progress and encouraged MOB to contact a medical professional if symptoms are noted at any time. MOB reported she feels comfortable reaching out to her physician if she has concerns. CSW assessed MOB for safety. MOB denies thoughts of harm to self and others.     CSW provided review of Sudden Infant Death Syndrome (SIDS) precautions. MOB reports the infant will sleep in a bassinet. MOB reports she has essential items for the infant. MOB has chosen [employer] in [place]. CSW assessed MOB for additional needs. MOB report no further needs.  CSW identifies no further need for intervention and no barriers to discharge at this time.  Kathrin Greathouse, MSW, LCSW Women's and [place] Worker  [phone] 06/17/2021  2:24 PM

## 2021-06-17 NOTE — Progress Notes (Addendum)
POSTPARTUM PROGRESS NOTE  POD #1  Subjective:  Judy Miller is a 25 y.o. G1P1001 s/p pLTCS at [redacted]w[redacted]d  She reports she doing well. No acute events overnight. She denies any problems with ambulating, voiding or po intake. Denies nausea or vomiting. She has passed flatus. Pain is well controlled.  Lochia is appropriate.  Objective: Blood pressure 111/74, pulse 60, temperature 98.5 F (36.9 C), temperature source Oral, resp. rate 18, height 5' 6"  (1.676 m), weight 87.5 kg, last menstrual period 09/27/2020, SpO2 98 %, unknown if currently breastfeeding.  Physical Exam:  General: alert, cooperative and no distress Chest: no respiratory distress Heart:regular rate, distal pulses intact Abdomen: soft, nontender,  Uterine Fundus: firm, appropriately tender DVT Evaluation: No calf swelling or tenderness Extremities: no edema Skin: warm, dry; incision clean/dry (some dried blood on the edge of the dressing) /intact w/ honeycomb dressing in place  Recent Labs    06/17/21 0457  HGB 9.1*  HCT 28.3*    Assessment/Plan: Judy Whitelockis a 25y.o. G1P1001 s/p pLTCS at 36w3dor HSV outbreak.  POD#1 - Doing welll; pain well controlled. H/H appropriate  Routine postpartum care  OOB, ambulated  Lovenox for VTE prophylaxis Anemia: asymptomatic  Hgb 9.1, Start po ferrous sulfate BID  Contraception: POPs Feeding: breast  Dispo: Plan for discharge 7/16.   LOS: 1 day I personally saw and evaluated the patient, performing the key elements of the service. I developed and verified the management plan that is described in the resident's/student's note, and I agree with the content with my edits above. VSS, HRR&R, Resp unlabored, Legs neg.  FrNigel BertholdCNM 06/18/2021 8:13 AM     MiArmandina GemmaMD PGY3 06/17/2021, 7:14 AM

## 2021-06-17 NOTE — Lactation Note (Signed)
This note was copied from a baby's chart. Lactation Consultation Note  Patient Name: Judy Miller [id number] Date: 06/17/2021   Age:23 Hours  Mother reports that infant is latch on and off with a poor latch. She describes painful latch.  Observed mothers nipples to be very pink and tender.  Mother was fit with a #24 nipple shield that was too large. # 20 was a little tight but mother reports that when infant latched it felt so much better than on the bare breast.  Mother to continue to attempt to get a good latch with the bare breast and if not use the  nipple shield. Mother to pump every 3 hours.    Mother is hand expression and using curved tip syringe to finger fed infant.  Discussed option of DBM. Parents are agreeable to the use of DBM.  Staff nurse informed.   Maternal Data    Feeding    LATCH Score                    Lactation Tools Discussed/Used    Interventions    Discharge    Consult Status      Michel Bickers 06/17/2021, 3:27 PM

## 2021-06-18 LAB — RPR: RPR Ser Ql: NONREACTIVE

## 2021-06-18 MED ORDER — OXYCODONE HCL 5 MG PO TABS: 5.0000 mg | ORAL_TABLET | Freq: Four times a day (QID) | ORAL | 0 refills | Status: DC | PRN

## 2021-06-18 MED ORDER — NORETHINDRONE 0.35 MG PO TABS: 1.0000 | ORAL_TABLET | Freq: Every day | ORAL | 11 refills | Status: DC

## 2021-06-18 MED ORDER — IBUPROFEN 600 MG PO TABS: 600.0000 mg | ORAL_TABLET | Freq: Four times a day (QID) | ORAL | 0 refills | Status: DC | PRN

## 2021-06-18 NOTE — Lactation Note (Addendum)
This note was copied from a baby's chart. Lactation Consultation Note  Patient Name: Judy Miller [id number] Date: 06/18/2021   Age:23 hours  Mom reports + breast changes w/pregnancy, including increased veining & cup size. Mom has not pumped recently. Mom says it hurts to pump with the size 24 flanges. Mom was shown p. 28 of the [employer] brochure & she said it appears that she is using a flange that is too large. I concur that the size 21 flange would be more appropriate (we provided an extra flange so she has two size 21 flanges in case she ever buys a Medela pump or rents one).  Mom has Dr. Theora Gianotti bottles at home. I recommended she start with the Newborn/Transitional nipple, if available. I described to parents what the swallowing sounds should sound like. Mom has a Mom Cozy pump at home.   Parents know how to reach [place] for post-discharge questions. Mom thinks she will likely exclusively pump for this infant due to discomfort with latching, even with with nipple shield. Mom says a friend will be providing them with her extra milk. Mom encouraged to pump 8+ times/day (or when infant gets a bottle).    Mom reports increased breast heaviness. Lurline Hare [facility] 06/18/2021, 9:56 AM

## 2021-06-18 NOTE — Discharge Summary (Addendum)
Postpartum Discharge Summary     Patient Name: Judy Miller DOB: 1996/02/09 MRN: [id number]  Date of admission: 06/16/2021 Delivery date:06/16/2021  Delivering provider: Caren Macadam  Date of discharge: 06/18/2021  Admitting diagnosis: S/P primary low transverse C-section [Z98.891] Intrauterine pregnancy: [redacted]w[redacted]d    Secondary diagnosis:  Principal Problem:   S/P primary low transverse C-section Active Problems:   ADHD (attention deficit hyperactivity disorder)   Anxiety and depression   Encounter for supervision of normal first pregnancy   History of genital herpes complicating pregnancy   Gestational hypertension, third trimester  Additional problems: none    Discharge diagnosis: Term Pregnancy Delivered, Gestational Hypertension, and Recent HSV outbreak Cesarean Delivery    Postop anemia                                       Post partum procedures: none Augmentation: N/A Complications: None  Hospital course: Sceduled C/S   25y.o. yo G1P1001 at 232w3das admitted to the hospital 06/16/2021 for scheduled cesarean section with the following indication:Active HSV.Delivery details are as follows:  Membrane Rupture Time/Date: 12:53 PM ,06/16/2021   Delivery Method:C-Section, Low Transverse  Details of operation can be found in separate operative note.  Patient had an uncomplicated postpartum course.  She is ambulating, tolerating a regular diet, passing flatus, and urinating well. Patient is discharged home in stable condition on  06/18/21        Newborn Data: Birth date:06/16/2021  Birth time:12:54 PM  Gender:Female  Living status:Living  Apgars:9 ,9  Weight:3070 g     Magnesium Sulfate received: No BMZ received: No Rhophylac:N/A MMR:No T-DaP: no Flu: No Transfusion:No  Physical exam  Vitals:   06/17/21 0520 06/17/21 1336 06/17/21 2325 06/18/21 0518  BP:  125/71 124/73 132/79  Pulse:  98 85 92  Resp: _0 Temp: 98.5 F (36.9 C) 98.3 F  (36.8 C) 98.2 F (36.8 C) 98.1 F (36.7 C)  TempSrc: Oral Oral Oral Oral  SpO2:  99% 98% 99%  Weight:      Height:       General: alert, cooperative, and no distress Lochia: appropriate Uterine Fundus: firm Incision: Dressing is clean, dry, and intact DVT Evaluation: No evidence of DVT seen on physical exam. Labs: Lab Results  Component Value Date   WBC 20.8 (H) 06/17/2021   HGB 9.1 (L) 06/17/2021   HCT 28.3 (L) 06/17/2021   MCV 77.7 (L) 06/17/2021   PLT 235 06/17/2021   CMP Latest Ref Rng & Units 06/13/2021  Glucose 65 - 99 mg/dL 146(H)  BUN 6 - 20 mg/dL 7  Creatinine 0.57 - 1.00 mg/dL 0.52(L)  Sodium 134 - 144 mmol/L 133(L)  Potassium 3.5 - 5.2 mmol/L 4.2  Chloride 96 - 106 mmol/L 99  CO2 20 - 29 mmol/L 18(L)  Calcium 8.7 - 10.2 mg/dL 9.2  Total Protein 6.0 - 8.5 g/dL 6.2  Total Bilirubin 0.0 - 1.2 mg/dL 0.2  Alkaline Phos 44 - 121 IU/L 185(H)  AST 0 - 40 IU/L 18  ALT 0 - 32 IU/L 11   Edinburgh Score: Edinburgh Postnatal Depression Scale Screening Tool 06/17/2021  I have been able to laugh and see the funny side of things. (No Data)      After visit meds:  Allergies as of 06/18/2021       Reactions   Cod [fish Allergy]  Hives   Grouper fish   Fish-derived Products Hives   Grouper fish   Other Hives, Other (See Comments)   Red hair dye Crab legs Grouper fish   Shellfish Allergy Other (See Comments)   Crab legs        Medication List     TAKE these medications    ibuprofen 600 MG tablet Commonly known as: ADVIL Take 1 tablet (600 mg total) by mouth every 6 (six) hours as needed for mild pain or moderate pain.   multivitamin-prenatal 27-0.8 MG Tabs tablet Take 1 tablet by mouth daily at 12 noon.   norethindrone 0.35 MG tablet Commonly known as: Ortho Micronor Take 1 tablet (0.35 mg total) by mouth daily.   oxyCODONE 5 MG immediate release tablet Commonly known as: Oxy IR/ROXICODONE Take 1 tablet (5 mg total) by mouth every 6 (six) hours as  needed for breakthrough pain or severe pain.   valACYclovir 1000 MG tablet Commonly known as: VALTREX Take 1 tablet (1,000 mg total) by mouth daily.         Discharge home in stable condition Infant Feeding: Breast Infant Disposition:home with mother Discharge instruction: per After Visit Summary and Postpartum booklet. Activity: Advance as tolerated. Pelvic rest for 6 weeks.  Diet: routine diet Anticipated Birth Control: POPs Postpartum Appointment:1 week Additional Postpartum F/U: Postpartum Depression checkup and Incision check 1 week Future Appointments:No future appointments. Follow up Visit:  [place] [facility] [place] at [facility]. Schedule an appointment as soon as possible for a visit in 1 week(s).   Specialty: Obstetrics and Gynecology Why: For incision check Contact information: [address] [place] [phone]                    06/18/2021 Hansel Feinstein, CNM

## 2021-06-20 ENCOUNTER — Encounter: Payer: BC Managed Care – PPO | Admitting: Obstetrics & Gynecology

## 2021-06-23 ENCOUNTER — Other Ambulatory Visit: Payer: Self-pay

## 2021-06-23 ENCOUNTER — Ambulatory Visit (INDEPENDENT_AMBULATORY_CARE_PROVIDER_SITE_OTHER): Payer: BC Managed Care – PPO | Admitting: *Deleted

## 2021-06-23 VITALS — BP 135/90 | HR 85

## 2021-06-23 DIAGNOSIS — Z98891 History of uterine scar from previous surgery: Secondary | ICD-10-CM

## 2021-06-23 MED ORDER — NIFEDIPINE ER OSMOTIC RELEASE 30 MG PO TB24: 30.0000 mg | ORAL_TABLET | Freq: Every day | ORAL | 2 refills | Status: DC

## 2021-06-23 NOTE — Progress Notes (Signed)
Patient was assessed and managed by nursing staff during this encounter. I have reviewed the chart and agree with the documentation and plan.   Ladrea Holladay, MD 06/23/2021 12:24 PM 

## 2021-06-23 NOTE — Progress Notes (Signed)
Subjective:  Judy Miller is a 25 y.o. female here for BP check and incision check.  Hypertension ROS: no chest pain on exertion, no dyspnea on exertion, and no swelling of ankles.   Pt reports incision covered  Objective:  BP 135/90   Pulse 85   Appearance alert, well appearing, and in no distress. Incision healing well, no significant drainage, no dehiscence, honeycomb removed.   Assessment:   Blood Pressure today in office needs improvement.  Incision healed nicely, no signs of infection, scar looks good  Plan:  Per Dr Judy Miller will start pt on BP meds. Pt will return next week for BP check and also to discuss anxiety.  Marland Kitchen

## 2021-06-27 ENCOUNTER — Ambulatory Visit (INDEPENDENT_AMBULATORY_CARE_PROVIDER_SITE_OTHER): Payer: BC Managed Care – PPO | Admitting: Family Medicine

## 2021-06-27 ENCOUNTER — Other Ambulatory Visit: Payer: Self-pay

## 2021-06-27 ENCOUNTER — Encounter: Payer: BC Managed Care – PPO | Admitting: Family Medicine

## 2021-06-27 VITALS — BP 132/85 | HR 89

## 2021-06-27 DIAGNOSIS — O9279 Other disorders of lactation: Secondary | ICD-10-CM

## 2021-06-27 DIAGNOSIS — R4586 Emotional lability: Secondary | ICD-10-CM

## 2021-06-27 NOTE — Progress Notes (Signed)
   GYNECOLOGY PROBLEM  VISIT ENCOUNTER NOTE  Subjective:   Judy Miller is a 25 y.o. G54P1001 female here for a a problem GYN visit.  Current complaints: mood changes postpartum and lactation/pumping.  Reports worse mood sx last week related to transition to parenting and    Denies abnormal vaginal bleeding, discharge, pelvic pain, problems with intercourse or other gynecologic concerns.     Edinburgh Postnatal Depression Scale - 06/27/21 1608       Edinburgh Postnatal Depression Scale:  In the Past 7 Days   I have been able to laugh and see the funny side of things. 1    I have looked forward with enjoyment to things. 0    I have blamed myself unnecessarily when things went wrong. 0    I have been anxious or worried for no good reason. 2    I have felt scared or panicky for no good reason. 2    Things have been getting on top of me. 1    I have been so unhappy that I have had difficulty sleeping. 0    I have felt sad or miserable. 1    I have been so unhappy that I have been crying. 1    The thought of harming myself has occurred to me. 0    Edinburgh Postnatal Depression Scale Total 8            GAD 7 : Generalized Anxiety Score 06/27/2021 06/14/2021 10/05/2020 10/31/2017  Nervous, Anxious, on Edge 1 1 3 3   Control/stop worrying 1 0 3 3  Worry too much - different things 2 1 3 3   Trouble relaxing 0 0 1 1  Restless 0 0 3 3  Easily annoyed or irritable 1 0 3 3  Afraid - awful might happen 1 0 3 3  Total GAD 7 Score 6 2 19 19   Anxiety Difficulty - - Somewhat difficult Somewhat difficult     Gynecologic History No LMP recorded. Contraception: abstinence  Health Maintenance Due  Topic Date Due   Pneumococcal Vaccine 47-52 Years old (1 - PCV) Never done   HPV VACCINES (3 - 3-dose series) 04/02/2014    The following portions of the patient's history were reviewed and updated as appropriate: allergies, current medications, past family history, past medical history,  past social history, past surgical history and problem list.  Review of Systems Pertinent items are noted in HPI.   Objective:  BP 132/85   Pulse 89  Gen: well appearing, NAD HEENT: no scleral icterus CV: RR Lung: Normal WOB Ext: warm well perfused  PELVIC:  Not indicated   Assessment and Plan:   1. Mood change Improving Declines BH referral Discussed zoloft vs counseling if sx worsen  2. Lactation symptom She is currently exclusively pumping Discussed attempting to latch Willow -- discussed pumping 1-2 oz and feeding Willow 1-2 oz and then attempting latch at that point and avoiding latch when super hungry or full.  Reviewed current supply which is high/normal with 6-9oz per pump session.  Reviewed draining breast completely  Please refer to After Visit Summary for other counseling recommendations.   Return in about 4 weeks (around 07/25/2021) for Postpartum visit.  Future Appointments  Date Time Provider Department Center  07/27/2021  1:30 PM 04/04/2014, MD [facility]    07/27/2021, MD, MPH, ABFM Attending Physician Faculty Practice- Center for [facility]

## 2021-06-28 ENCOUNTER — Encounter: Payer: Self-pay | Admitting: Family Medicine

## 2021-06-28 ENCOUNTER — Telehealth (HOSPITAL_COMMUNITY): Payer: Self-pay | Admitting: *Deleted

## 2021-06-28 NOTE — Telephone Encounter (Signed)
Mom reports feeling well. Incision healing without difficulty. OB visit yesterday with EPDS = 8 (Hospital score = 5) Mom reports baby is well. Pumping breastmilk bottle feeding. Latch hasn't been good, but has an LC available. Baby sleeps in bassinet on back in parents room.  Duffy Rhody, RN 06/28/2021 at 1:51pm

## 2021-06-30 ENCOUNTER — Other Ambulatory Visit: Payer: Self-pay | Admitting: Obstetrics & Gynecology

## 2021-07-01 ENCOUNTER — Encounter: Payer: Self-pay | Admitting: Primary Care

## 2021-07-01 ENCOUNTER — Telehealth: Payer: Self-pay

## 2021-07-01 NOTE — Telephone Encounter (Signed)
Pt called and said she had a baby 2 wks ago; congratulations were given. Pt said she has been having "bad anxiety attacks". Pt last appt with OB was 06/27/21 and anxiety was discussed and discussed [facility] referral and zoloft vs counseling if sx worsen. Pt said she had sent my chart note to OB but the office closed at 12 noon today and pt has not gotten response back.  Pt wants to see if med could be called in for pt by Allayne Gitelman NP this afternoon. No available appts at [facility] this afternoon. Pt last saw Allayne Gitelman NP on 10/05/20. Pt said now she is feeling anxious, jittery, nervous and CP and SOB which pt gets prior to an anxiety attack.  Pt is presently on BP med and BC pills but no med for anxiety. Pt is not seeing a therapist.pt said no SI/HI. Pt is afraid that she will have a problem with anxiety like the other day she felt like she was going to pass out and had to call a family member to help because she is sole caregiver for her baby. Her boyfriend lives with her but he works a lot and pt wants to make sure that she is able to care for her baby. Pts next appt with OB is 07/27/21 for 4 wk post partum and does not have appt scheduled with Jae Dire. Mayra Reel NP said pt could schedule next available appt with her, of if urgent can go to [facility] walk in or go to ED if needs to be seen today. Pt said she does not want to make an appt and will call her OB for med. I advised pt again if cannot get with OB today and pt said she needs the med today can go to [facility] or to ED. Pt voiced understanding and ended call. Sending note to Allayne Gitelman NP.

## 2021-07-01 NOTE — Telephone Encounter (Signed)
Noted.  Please follow-up with patient on 07/04/2021 to ensure she was cared for.  I am happy to add her in that week if she has not connected with her OB.

## 2021-07-04 ENCOUNTER — Telehealth: Payer: Self-pay | Admitting: *Deleted

## 2021-07-04 ENCOUNTER — Other Ambulatory Visit: Payer: Self-pay | Admitting: *Deleted

## 2021-07-04 MED ORDER — HYDROXYZINE HCL 25 MG PO TABS: 25.0000 mg | ORAL_TABLET | Freq: Four times a day (QID) | ORAL | 2 refills | Status: DC | PRN

## 2021-07-04 MED ORDER — SERTRALINE HCL 50 MG PO TABS: 50.0000 mg | ORAL_TABLET | Freq: Every day | ORAL | 2 refills | Status: DC

## 2021-07-04 NOTE — Telephone Encounter (Signed)
Called pt to let her know we went ahead and sent in medications for her anxiety. Pt has follow up in about 4 weeks and can discuss increase if needed.

## 2021-07-04 NOTE — Telephone Encounter (Signed)
Patient returned call back. Has not received call back from OB. Declined getting appointment with out office. If she has not received call back from [facility] she will call our office and we will get her on schedule to see Jae Dire.

## 2021-07-04 NOTE — Telephone Encounter (Signed)
Left message to return call to our office. Do see where she has sent message to OB. Do not see response yet. Will call to follow up later today.

## 2021-07-05 NOTE — Telephone Encounter (Signed)
Noted. It appears that she has a prescription for Zoloft 50 mg now.

## 2021-07-07 DIAGNOSIS — Z20822 Contact with and (suspected) exposure to covid-19: Secondary | ICD-10-CM | POA: Diagnosis not present

## 2021-07-07 DIAGNOSIS — R079 Chest pain, unspecified: Secondary | ICD-10-CM | POA: Diagnosis not present

## 2021-07-08 DIAGNOSIS — R079 Chest pain, unspecified: Secondary | ICD-10-CM | POA: Diagnosis not present

## 2021-07-08 DIAGNOSIS — Z20822 Contact with and (suspected) exposure to covid-19: Secondary | ICD-10-CM | POA: Diagnosis not present

## 2021-07-15 ENCOUNTER — Other Ambulatory Visit: Payer: Self-pay | Admitting: Obstetrics & Gynecology

## 2021-07-26 ENCOUNTER — Other Ambulatory Visit: Payer: Self-pay | Admitting: Obstetrics & Gynecology

## 2021-07-27 ENCOUNTER — Ambulatory Visit (INDEPENDENT_AMBULATORY_CARE_PROVIDER_SITE_OTHER): Payer: BC Managed Care – PPO | Admitting: Family Medicine

## 2021-07-27 ENCOUNTER — Encounter: Payer: Self-pay | Admitting: Family Medicine

## 2021-07-27 ENCOUNTER — Other Ambulatory Visit: Payer: Self-pay

## 2021-07-27 DIAGNOSIS — O99345 Other mental disorders complicating the puerperium: Secondary | ICD-10-CM | POA: Insufficient documentation

## 2021-07-27 DIAGNOSIS — F418 Other specified anxiety disorders: Secondary | ICD-10-CM | POA: Insufficient documentation

## 2021-07-27 DIAGNOSIS — Z30430 Encounter for insertion of intrauterine contraceptive device: Secondary | ICD-10-CM | POA: Diagnosis not present

## 2021-07-27 DIAGNOSIS — Z8619 Personal history of other infectious and parasitic diseases: Secondary | ICD-10-CM | POA: Diagnosis not present

## 2021-07-27 HISTORY — DX: Other mental disorders complicating the puerperium: O99.345

## 2021-07-27 HISTORY — DX: Other specified anxiety disorders: F41.8

## 2021-07-27 MED ORDER — SERTRALINE HCL 50 MG PO TABS: 50.0000 mg | ORAL_TABLET | Freq: Every day | ORAL | 11 refills | Status: DC

## 2021-07-27 MED ORDER — LEVONORGESTREL 20.1 MCG/DAY IU IUD
1.0000 | INTRAUTERINE_SYSTEM | Freq: Once | INTRAUTERINE | Status: AC
  Administered 2021-07-27: 1 via INTRAUTERINE

## 2021-07-27 NOTE — Progress Notes (Signed)
Post Partum Visit Note  Judy Miller is a 25 y.o. G21P1001 female who presents for a postpartum visit. She is 6 weeks postpartum following a primary cesarean section.  I have fully reviewed the prenatal and intrapartum course. The delivery was at 38.3 gestational weeks.  Anesthesia: spinal. Postpartum course has been complicated by hypertension and anxiety. Baby is doing well. Baby is feeding by both breast and bottle - enfamil . Bleeding no bleeding. Bowel function is normal. Bladder function is normal. Patient is not sexually active. Contraception method is oral progesterone-only contraceptive. Postpartum depression screening: negative.   The pregnancy intention screening data noted above was reviewed. Potential methods of contraception were discussed. The patient elected to proceed with No data recorded.   Edinburgh Postnatal Depression Scale - 07/27/21 1341       Edinburgh Postnatal Depression Scale:  In the Past 7 Days   I have been able to laugh and see the funny side of things. 0    I have looked forward with enjoyment to things. 0    I have blamed myself unnecessarily when things went wrong. 0    I have been anxious or worried for no good reason. 0    I have felt scared or panicky for no good reason. 0    Things have been getting on top of me. 0    I have been so unhappy that I have had difficulty sleeping. 0    I have felt sad or miserable. 0    I have been so unhappy that I have been crying. 1    The thought of harming myself has occurred to me. 0    Edinburgh Postnatal Depression Scale Total 1             Health Maintenance Due  Topic Date Due   COVID-19 Vaccine (1) Never done   Pneumococcal Vaccine 24-6 Years old (1 - PCV) Never done   HPV VACCINES (3 - 3-dose series) 04/02/2014   INFLUENZA VACCINE  07/04/2021    The following portions of the patient's history were reviewed and updated as appropriate: allergies, current medications, past family history,  past medical history, past social history, past surgical history, and problem list.  Review of Systems Pertinent items are noted in HPI.  Objective:  BP 120/84   Pulse (!) 105   Wt 163 lb (73.9 kg)   Breastfeeding Yes   BMI 26.31 kg/m    General:  alert, cooperative, and appears stated age   Breasts:  not indicated  Lungs: clear to auscultation bilaterally  Heart:  regular rate and rhythm, S1, S2 normal, no murmur, click, rub or gallop  Abdomen: soft, non-tender; bowel sounds normal; no masses,  no organomegaly   Wound well approximated incision  GU exam:  normal- IUD placed with chaperone present.          IUD Insertion Procedure Note Patient identified, informed consent performed, consent signed.   Discussed risks of irregular bleeding, cramping, infection, malpositioning or misplacement of the IUD outside the uterus which may require further procedure such as laparoscopy. Time out was performed.    Speculum placed in the vagina.  Cervix visualized.  Cleaned with Betadine x 2.  Grasped anteriorly with a single tooth tenaculum.  Uterus sounded to 8 cm.  IUD placed per manufacturer's recommendations.  Strings trimmed to 3 cm. Tenaculum was removed, good hemostasis noted.  Patient tolerated procedure well.   Patient was given post-procedure instructions- both agency handout  and verbally by provider.  She was advised to have backup contraception for one week.  Patient was also asked to check IUD strings periodically or follow up in 4 weeks for IUD check.  Assessment:   Normal postpartum exam.   Plan:   Essential components of care per ACOG recommendations:  1.  Mood and well being: Patient with negative depression screening today. Start on Zoloft 50mg  several weeks ago and is doing very well on medication. Rx for 1 year given today. Reviewed local resources for support.  - Patient tobacco use? No.   - hx of drug use? No.    2. Infant care and feeding:  -Patient currently  breastmilk feeding? Yes. Reviewed importance of draining breast regularly to support lactation.  Reviewed increasing supply with increased pumping frequency, herbal supplements and power pump -Social determinants of health (SDOH) reviewed in EPIC. No concerns  3. Sexuality, contraception and birth spacing - Patient does not want a pregnancy in the next year.  Desired family size is 2 children.  - Reviewed forms of contraception in tiered fashion. Patient desired IUD today.   - Discussed birth spacing of 18 months  4. Sleep and fatigue -Encouraged family/partner/community support of 4 hrs of uninterrupted sleep to help with mood and fatigue  5. Physical Recovery  - Discussed patients delivery and complications. She describes her labor as good. - Patient had a C-section, no problems at delivery.  - Patient has urinary incontinence? No. - Patient is safe to resume physical and sexual activity  6.  Health Maintenance - HM due items addressed Yes - Last pap smear  Diagnosis  Date Value Ref Range Status  12/13/2020   Final   - Negative for intraepithelial lesion or malignancy (NILM)   Pap smear not done at today's visit.  -Breast Cancer screening indicated? No.   7. Chronic Disease/Pregnancy Condition follow up:  postpartum anxiety/depression, gHTN  PPA: improved on medication, counseled to continue gHTN: BP WNL today  - PCP follow up  02/10/2021, MD Center for [facility], [facility]

## 2021-08-09 NOTE — Telephone Encounter (Signed)
Judy Miller, can you change appointment to in person please, thanks!

## 2021-08-10 ENCOUNTER — Other Ambulatory Visit: Payer: Self-pay

## 2021-08-10 ENCOUNTER — Encounter: Payer: Self-pay | Admitting: Primary Care

## 2021-08-10 ENCOUNTER — Ambulatory Visit (INDEPENDENT_AMBULATORY_CARE_PROVIDER_SITE_OTHER): Payer: BC Managed Care – PPO | Admitting: Primary Care

## 2021-08-10 VITALS — BP 108/72 | HR 96 | Temp 98.1°F | Ht 66.0 in | Wt 165.0 lb

## 2021-08-10 DIAGNOSIS — R059 Cough, unspecified: Secondary | ICD-10-CM | POA: Diagnosis not present

## 2021-08-10 DIAGNOSIS — H9203 Otalgia, bilateral: Secondary | ICD-10-CM | POA: Insufficient documentation

## 2021-08-10 MED ORDER — FAMOTIDINE 20 MG PO TABS: 20.0000 mg | ORAL_TABLET | Freq: Two times a day (BID) | ORAL | 0 refills | Status: DC

## 2021-08-10 NOTE — Assessment & Plan Note (Addendum)
  Exam unremarkable except slight erythema r/t itching with Q tips.   Could be secondary to allergy involvement. Will treat symptoms of cough, she will update.   I evaluated patient, was consulted regarding treatment, and agree with assessment and plan per Kathaleen Maser, RN, DNP student.   Mayra Reel, NP-C

## 2021-08-10 NOTE — Progress Notes (Signed)
Established Patient Office Visit  Subjective:  Patient ID: Judy Miller, female    DOB: June 29, 1996  Age: 25 y.o. MRN: [id number]  CC:  Chief Complaint  Patient presents with   Ear Pain    B/l ears with sore throat and ears are itchy at times. X 2 weeks patient is breast feeding     HPI Judy Miller is a 25 year old with past medical history of  ADHD, anxiety and depression, herpes genitalis, nephrolithiasis, and recent C-section 06/16/21, presents for acute bilateral ear pain with pruritis and sore throat with nonproductive cough that started approximately 2 weeks ago, symptoms have been constant, and have worsened this week compared to last week. She has not used anything to help with the symptoms and cannot identify anything that makes them worse. Her symptoms are not disruptive to her sleep. She denies any ear drainage, abnormal sputum, and fever.   She vapes multiple times per day, has been for 9 years and voiced concern for throat cancer.   No recent history of respiratory infection, recent COVID test negative.     HPV: Up to Date   Past Medical History:  Diagnosis Date   Acne vulgaris 05/12/2015   ADHD (attention deficit hyperactivity disorder)    Anxiety    Arthritis    history of this in her knees   Depression    Depression, major, single episode, mild ([place]) 04/23/2013   Generalized anxiety disorder 11/06/2013   Genital herpes 11/2017   pos HSV 2 culture   Gestational hypertension, third trimester 06/13/2021   Renal stone 01/21/2019   Rhinitis, allergic 11/10/2015   Vaccine for human papilloma virus (HPV) types 6, 11, 16, and 18 administered     Past Surgical History:  Procedure Laterality Date   CESAREAN SECTION N/A 06/16/2021   Procedure: CESAREAN SECTION;  Surgeon: Caren Macadam, MD;  Location: [facility];  Service: Obstetrics;  Laterality: N/A;    Family History  Problem Relation Age of Onset   Arthritis Mother    Varicose  Veins Maternal Grandmother    Heart disease Paternal Grandmother    Varicose Veins Paternal Grandmother     Social History   Socioeconomic History   Marital status: Single    Spouse name: Not on file   Number of children: Not on file   Years of education: Not on file   Highest education level: Not on file  Occupational History   Not on file  Tobacco Use   Smoking status: Every Day    Types: E-cigarettes   Smokeless tobacco: Never  Vaping Use   Vaping Use: Every day   Substances: Nicotine  Substance and Sexual Activity   Alcohol use: Yes    Alcohol/week: 0.0 standard drinks    Comment: barely   Drug use: No    Comment: history of    Sexual activity: Yes    Birth control/protection: None  Other Topics Concern   Not on file  Social History Narrative   Single.   Graduated from HS in 2017.    Student in cosmetology school.   Enjoys spending time outdoors, playing with her dog.   Social Determinants of Health   Financial Resource Strain: Not on file  Food Insecurity: No Food Insecurity   Worried About Charity fundraiser in the Last Year: Never true   Ran Out of Food in the Last Year: Never true  Transportation Needs: No Transportation Needs   Lack of Transportation (  Medical): No   Lack of Transportation (Non-Medical): No  Physical Activity: Not on file  Stress: Not on file  Social Connections: Not on file  Intimate Partner Violence: Not on file    Outpatient Medications Prior to Visit  Medication Sig Dispense Refill   hydrOXYzine (ATARAX/VISTARIL) 25 MG tablet Take 1 tablet (25 mg total) by mouth 4 (four) times daily as needed for anxiety. 30 tablet 2   ibuprofen (ADVIL) 600 MG tablet Take 1 tablet (600 mg total) by mouth every 6 (six) hours as needed for mild pain or moderate pain. 30 tablet 0   norethindrone (ORTHO MICRONOR) 0.35 MG tablet Take 1 tablet (0.35 mg total) by mouth daily. 28 tablet 11   Prenatal Vit-Fe Fumarate-FA (MULTIVITAMIN-PRENATAL) 27-0.8 MG  TABS tablet Take 1 tablet by mouth daily at 12 noon.     sertraline (ZOLOFT) 50 MG tablet Take 1 tablet (50 mg total) by mouth daily. 30 tablet 11   valACYclovir (VALTREX) 1000 MG tablet Take 1 tablet (1,000 mg total) by mouth daily. 60 tablet 3   No facility-administered medications prior to visit.    Allergies  Allergen Reactions   Cod [Fish Allergy] Hives    Grouper fish   Fish-Derived Products Hives    Grouper fish   Other Hives and Other (See Comments)    Red hair dye Crab legs Grouper fish   Shellfish Allergy Other (See Comments)    Crab legs     ROS Review of Systems  Constitutional:  Negative for chills and fever.  HENT:  Positive for ear pain and sore throat. Negative for congestion, hearing loss, postnasal drip and rhinorrhea.   Eyes: Negative.   Respiratory:  Positive for cough. Negative for shortness of breath and wheezing.        Non-productive cough   Cardiovascular: Negative.   Skin:        Pruritis bilateral ear canals   Allergic/Immunologic: Positive for food allergies. Negative for environmental allergies.     Objective:    Physical Exam Constitutional:      Appearance: Normal appearance.  HENT:     Head: Normocephalic and atraumatic.     Right Ear: Tympanic membrane normal.     Left Ear: Tympanic membrane normal.     Ears:     Comments: Slight erythema to left ear canal, pt reports itching with Q-tips     Nose: Nose normal.     Mouth/Throat:     Mouth: Mucous membranes are moist.     Pharynx: No oropharyngeal exudate.     Comments: Tonsils 1+ bilaterally without erythema  Cardiovascular:     Rate and Rhythm: Normal rate and regular rhythm.     Pulses: Normal pulses.     Heart sounds: Normal heart sounds.  Pulmonary:     Effort: Pulmonary effort is normal.     Breath sounds: Normal breath sounds.  Skin:    General: Skin is warm and dry.  Neurological:     Mental Status: She is alert.  Psychiatric:        Mood and Affect: Mood normal.         Behavior: Behavior normal.    There were no vitals taken for this visit. Wt Readings from Last 3 Encounters:  07/27/21 163 lb (73.9 kg)  06/16/21 193 lb (87.5 kg)  06/13/21 193 lb (87.5 kg)     Health Maintenance Due  Topic Date Due   COVID-19 Vaccine (1) Never done   Pneumococcal Vaccine  22-42 Years old (1 - PCV) Never done   HPV VACCINES (3 - 3-dose series) 04/02/2014   INFLUENZA VACCINE  Never done       Topic Date Due   HPV VACCINES (3 - 3-dose series) 04/02/2014    No results found for: TSH Lab Results  Component Value Date   WBC 20.8 (H) 06/17/2021   HGB 9.1 (L) 06/17/2021   HCT 28.3 (L) 06/17/2021   MCV 77.7 (L) 06/17/2021   PLT 235 06/17/2021   Lab Results  Component Value Date   NA 133 (L) 06/13/2021   K 4.2 06/13/2021   CO2 18 (L) 06/13/2021   GLUCOSE 146 (H) 06/13/2021   BUN 7 06/13/2021   CREATININE 0.52 (L) 06/13/2021   BILITOT 0.2 06/13/2021   ALKPHOS 185 (H) 06/13/2021   AST 18 06/13/2021   ALT 11 06/13/2021   PROT 6.2 06/13/2021   ALBUMIN 3.6 (L) 06/13/2021   CALCIUM 9.2 06/13/2021   ANIONGAP 7 01/15/2019   EGFR 134 06/13/2021   GFR 97.72 01/21/2019   No results found for: CHOL No results found for: HDL No results found for: LDLCALC No results found for: TRIG No results found for: CHOLHDL No results found for: HGBA1C    Assessment & Plan:   Problem List Items Addressed This Visit   None   No orders of the defined types were placed in this encounter.   Follow-up: No follow-ups on file.    Ninfa Meeker, RN

## 2021-08-10 NOTE — Patient Instructions (Addendum)
It was great to see you today!  Update me via MyChart in about 2 weeks, this medication can take up to 2 weeks to take effect.   If symptoms worsen or do not improve, please contact me.  Please try to cut back vaping. Our goal would be to stop vaping all together.    Gastroesophageal Reflux Disease, Adult Gastroesophageal reflux (GER) happens when acid from the stomach flows up into the tube that connects the mouth and the stomach (esophagus). Normally, food travels down the esophagus and stays in the stomach to be digested. However, when a person has GER, food and stomach acid sometimes move back up into the esophagus. If this becomes a more serious problem, the person may be diagnosed with a disease called gastroesophageal reflux disease (GERD). GERD occurs when the reflux: Happens often. Causes frequent or severe symptoms. Causes problems such as damage to the esophagus. When stomach acid comes in contact with the esophagus, the acid may cause inflammation in the esophagus. Over time, GERD may create small holes (ulcers) in the lining of the esophagus. What are the causes? This condition is caused by a problem with the muscle between the esophagus and the stomach (lower esophageal sphincter, or LES). Normally, the LES muscle closes after food passes through the esophagus to the stomach. When the LES is weakened or abnormal, it does not close properly, and that allows food and stomach acid to go back up into the esophagus. The LES can be weakened by certain dietary substances, medicines, and medical conditions, including: Tobacco use. Pregnancy. Having a hiatal hernia. Alcohol use. Certain foods and beverages, such as coffee, chocolate, onions, and peppermint. What increases the risk? You are more likely to develop this condition if you: Have an increased body weight. Have a connective tissue disorder. Take NSAIDs, such as ibuprofen. What are the signs or symptoms? Symptoms of this  condition include: Heartburn. Difficult or painful swallowing and the feeling of having a lump in the throat. A bitter taste in the mouth. Bad breath and having a large amount of saliva. Having an upset or bloated stomach and belching. Chest pain. Different conditions can cause chest pain. Make sure you see your health care provider if you experience chest pain. Shortness of breath or wheezing. Ongoing (chronic) cough or a nighttime cough. Wearing away of tooth enamel. Weight loss. How is this diagnosed? This condition may be diagnosed based on a medical history and a physical exam. To determine if you have mild or severe GERD, your health care provider may also monitor how you respond to treatment. You may also have tests, including: A test to examine your stomach and esophagus with a small camera (endoscopy). A test that measures the acidity level in your esophagus. A test that measures how much pressure is on your esophagus. A barium swallow or modified barium swallow test to show the shape, size, and functioning of your esophagus. How is this treated? Treatment for this condition may vary depending on how severe your symptoms are. Your health care provider may recommend: Changes to your diet. Medicine. Surgery. The goal of treatment is to help relieve your symptoms and to prevent complications. Follow these instructions at home: Eating and drinking  Follow a diet as recommended by your health care provider. This may involve avoiding foods and drinks such as: Coffee and tea, with or without caffeine. Drinks that contain alcohol. Energy drinks and sports drinks. Carbonated drinks or sodas. Chocolate and cocoa. Peppermint and mint flavorings. Garlic  and onions. Horseradish. Spicy and acidic foods, including peppers, chili powder, curry powder, vinegar, hot sauces, and barbecue sauce. Citrus fruit juices and citrus fruits, such as oranges, lemons, and limes. Tomato-based foods,  such as red sauce, chili, salsa, and pizza with red sauce. Fried and fatty foods, such as donuts, french fries, potato chips, and high-fat dressings. High-fat meats, such as hot dogs and fatty cuts of red and white meats, such as rib eye steak, sausage, ham, and bacon. High-fat dairy items, such as whole milk, butter, and cream cheese. Eat small, frequent meals instead of large meals. Avoid drinking large amounts of liquid with your meals. Avoid eating meals during the 2-3 hours before bedtime. Avoid lying down right after you eat. Do not exercise right after you eat. Lifestyle  Do not use any products that contain nicotine or tobacco. These products include cigarettes, chewing tobacco, and vaping devices, such as e-cigarettes. If you need help quitting, ask your health care provider. Try to reduce your stress by using methods such as yoga or meditation. If you need help reducing stress, ask your health care provider. If you are overweight, reduce your weight to an amount that is healthy for you. Ask your health care provider for guidance about a safe weight loss goal. General instructions Pay attention to any changes in your symptoms. Take over-the-counter and prescription medicines only as told by your health care provider. Do not take aspirin, ibuprofen, or other NSAIDs unless your health care provider told you to take these medicines. Wear loose-fitting clothing. Do not wear anything tight around your waist that causes pressure on your abdomen. Raise (elevate) the head of your bed about 6 inches (15 cm). You can use a wedge to do this. Avoid bending over if this makes your symptoms worse. Keep all follow-up visits. This is important. Contact a health care provider if: You have: New symptoms. Unexplained weight loss. Difficulty swallowing or it hurts to swallow. Wheezing or a persistent cough. A hoarse voice. Your symptoms do not improve with treatment. Get help right away if: You  have sudden pain in your arms, neck, jaw, teeth, or back. You suddenly feel sweaty, dizzy, or light-headed. You have chest pain or shortness of breath. You vomit and the vomit is green, yellow, or black, or it looks like blood or coffee grounds. You faint. You have stool that is red, bloody, or black. You cannot swallow, drink, or eat. These symptoms may represent a serious problem that is an emergency. Do not wait to see if the symptoms will go away. Get medical help right away. Call your local emergency services (911 in the [place] Do not drive yourself to the hospital. Summary Gastroesophageal reflux happens when acid from the stomach flows up into the esophagus. GERD is a disease in which the reflux happens often, causes frequent or severe symptoms, or causes problems such as damage to the esophagus. Treatment for this condition may vary depending on how severe your symptoms are. Your health care provider may recommend diet and lifestyle changes, medicine, or surgery. Contact a health care provider if you have new or worsening symptoms. Take over-the-counter and prescription medicines only as told by your health care provider. Do not take aspirin, ibuprofen, or other NSAIDs unless your health care provider told you to do so. Keep all follow-up visits as told by your health care provider. This is important. This information is not intended to replace advice given to you by your health care provider. Make sure you  discuss any questions you have with your health care provider. Document Revised: 05/31/2020 Document Reviewed: 05/31/2020 Elsevier Patient Education  2022 [employer].

## 2021-08-10 NOTE — Progress Notes (Signed)
Subjective:    Patient ID: Judy Miller, female    DOB: 12/17/1995, 25 y.o.   MRN: [id number]  HPI  Judy Miller is a very pleasant 25 y.o. female with a history of ADHD, anxiety and depression, postpartum anxiety who presents today to discuss otalgia and cough.   Located to the bilateral ears with symptoms of sore throat, itchy ears, dry cough. Symptoms began about two weeks ago. She's not tried anything OTC.   She is breast feeding. She denies fevers, post nasal drip, history of asthma, recent respiratory infection. She's tested negative for Covid recently. She's concerned that she may have throat cancer as she is an active vaper since the age of 88, vapes numerous times during the day.     Review of Systems  Constitutional:  Negative for fever.  HENT:  Positive for ear pain and sore throat. Negative for congestion and postnasal drip.   Respiratory:  Positive for cough.         Past Medical History:  Diagnosis Date   Acne vulgaris 05/12/2015   ADHD (attention deficit hyperactivity disorder)    Anxiety    Arthritis    history of this in her knees   Depression    Depression, major, single episode, mild ([facility]) 04/23/2013   Generalized anxiety disorder 11/06/2013   Genital herpes 11/2017   pos HSV 2 culture   Gestational hypertension, third trimester 06/13/2021   Renal stone 01/21/2019   Rhinitis, allergic 11/10/2015   Vaccine for human papilloma virus (HPV) types 6, 11, 16, and 18 administered     Social History   Socioeconomic History   Marital status: Single    Spouse name: Not on file   Number of children: Not on file   Years of education: Not on file   Highest education level: Not on file  Occupational History   Not on file  Tobacco Use   Smoking status: Every Day    Types: E-cigarettes   Smokeless tobacco: Never  Vaping Use   Vaping Use: Every day   Substances: Nicotine  Substance and Sexual Activity   Alcohol use: Yes    Alcohol/week:  0.0 standard drinks    Comment: barely   Drug use: No    Comment: history of    Sexual activity: Yes    Birth control/protection: None  Other Topics Concern   Not on file  Social History Narrative   Single.   Graduated from HS in 2017.    Student in cosmetology school.   Enjoys spending time outdoors, playing with her dog.   Social Determinants of Health   Financial Resource Strain: Not on file  Food Insecurity: No Food Insecurity   Worried About Programme researcher, broadcasting/film/video in the Last Year: Never true   Ran Out of Food in the Last Year: Never true  Transportation Needs: No Transportation Needs   Lack of Transportation (Medical): No   Lack of Transportation (Non-Medical): No  Physical Activity: Not on file  Stress: Not on file  Social Connections: Not on file  Intimate Partner Violence: Not on file    Past Surgical History:  Procedure Laterality Date   CESAREAN SECTION N/A 06/16/2021   Procedure: CESAREAN SECTION;  Surgeon: Federico Flake, MD;  Location: [facility];  Service: Obstetrics;  Laterality: N/A;    Family History  Problem Relation Age of Onset   Arthritis Mother    Varicose Veins Maternal Grandmother    Heart disease Paternal Grandmother  Varicose Veins Paternal Grandmother     Allergies  Allergen Reactions   Cod [Fish Allergy] Hives    Grouper fish   Fish-Derived Products Hives    Grouper fish   Other Hives and Other (See Comments)    Red hair dye Crab legs Grouper fish   Shellfish Allergy Other (See Comments)    Crab legs     Current Outpatient Medications on File Prior to Visit  Medication Sig Dispense Refill   hydrOXYzine (ATARAX/VISTARIL) 25 MG tablet Take 1 tablet (25 mg total) by mouth 4 (four) times daily as needed for anxiety. 30 tablet 2   Prenatal Vit-Fe Fumarate-FA (MULTIVITAMIN-PRENATAL) 27-0.8 MG TABS tablet Take 1 tablet by mouth daily at 12 noon.     sertraline (ZOLOFT) 50 MG tablet Take 1 tablet (50 mg total) by mouth daily. 30  tablet 11   valACYclovir (VALTREX) 1000 MG tablet Take 1 tablet (1,000 mg total) by mouth daily. 60 tablet 3   ibuprofen (ADVIL) 600 MG tablet Take 1 tablet (600 mg total) by mouth every 6 (six) hours as needed for mild pain or moderate pain. (Patient not taking: Reported on 08/10/2021) 30 tablet 0   No current facility-administered medications on file prior to visit.    BP 108/72   Pulse 96   Temp 98.1 F (36.7 C) (Temporal)   Ht 5\' 6"  (1.676 m)   Wt 165 lb (74.8 kg)   SpO2 98%   BMI 26.63 kg/m  Objective:   Physical Exam Constitutional:      Appearance: She is not ill-appearing.  HENT:     Right Ear: Tympanic membrane normal.     Left Ear: Tympanic membrane normal.     Mouth/Throat:     Mouth: Mucous membranes are moist.     Pharynx: No oropharyngeal exudate or posterior oropharyngeal erythema.  Cardiovascular:     Rate and Rhythm: Normal rate and regular rhythm.  Pulmonary:     Effort: Pulmonary effort is normal.     Breath sounds: Normal breath sounds.  Neurological:     Mental Status: She is alert.          Assessment & Plan:      This visit occurred during the SARS-CoV-2 public health emergency.  Safety protocols were in place, including screening questions prior to the visit, additional usage of staff PPE, and extensive cleaning of exam room while observing appropriate contact time as indicated for disinfecting solutions.

## 2021-08-10 NOTE — Assessment & Plan Note (Addendum)
Differential Dx include GERD, allergies, or viral URI.  Pharynx exam unremarkable  Suspicion for GERD Will start 20 mg Famotidine daily, she is breast feeding which is a safe option.   Pt advised to stop vaping as this could be contributive to GERD and notify via MyChart about symptoms within 2 weeks. Lower suspicion for allergies r/t absence of post-nasal drip.   Follow up PRN.  I evaluated patient, was consulted regarding treatment, and agree with assessment and plan per Kathaleen Maser, RN, DNP student.   Mayra Reel, NP-C

## 2021-08-24 ENCOUNTER — Other Ambulatory Visit: Payer: Self-pay | Admitting: Primary Care

## 2021-08-24 DIAGNOSIS — R059 Cough, unspecified: Secondary | ICD-10-CM

## 2021-08-29 DIAGNOSIS — D235 Other benign neoplasm of skin of trunk: Secondary | ICD-10-CM | POA: Diagnosis not present

## 2021-09-01 ENCOUNTER — Encounter: Payer: Self-pay | Admitting: Emergency Medicine

## 2021-09-01 ENCOUNTER — Other Ambulatory Visit: Payer: Self-pay

## 2021-09-01 ENCOUNTER — Emergency Department: Payer: BC Managed Care – PPO

## 2021-09-01 ENCOUNTER — Emergency Department
Admission: EM | Admit: 2021-09-01 | Discharge: 2021-09-01 | Disposition: A | Discharge status: Home / Self Care | Payer: BC Managed Care – PPO | Attending: Emergency Medicine | Admitting: Emergency Medicine

## 2021-09-01 DIAGNOSIS — N200 Calculus of kidney: Secondary | ICD-10-CM | POA: Diagnosis not present

## 2021-09-01 DIAGNOSIS — F17290 Nicotine dependence, other tobacco product, uncomplicated: Secondary | ICD-10-CM | POA: Diagnosis not present

## 2021-09-01 DIAGNOSIS — Y763 Surgical instruments, materials and obstetric and gynecological devices (including sutures) associated with adverse incidents: Secondary | ICD-10-CM | POA: Diagnosis not present

## 2021-09-01 DIAGNOSIS — R109 Unspecified abdominal pain: Secondary | ICD-10-CM | POA: Diagnosis not present

## 2021-09-01 DIAGNOSIS — N9489 Other specified conditions associated with female genital organs and menstrual cycle: Secondary | ICD-10-CM | POA: Diagnosis not present

## 2021-09-01 DIAGNOSIS — N132 Hydronephrosis with renal and ureteral calculous obstruction: Secondary | ICD-10-CM | POA: Diagnosis not present

## 2021-09-01 DIAGNOSIS — T8332XA Displacement of intrauterine contraceptive device, initial encounter: Secondary | ICD-10-CM | POA: Diagnosis not present

## 2021-09-01 LAB — URINALYSIS, COMPLETE (UACMP) WITH MICROSCOPIC
Bilirubin Urine: NEGATIVE
Glucose, UA: NEGATIVE mg/dL
Ketones, ur: NEGATIVE mg/dL
Leukocytes,Ua: NEGATIVE
Nitrite: NEGATIVE
Protein, ur: 30 mg/dL — AB
Specific Gravity, Urine: 1.025 (ref 1.005–1.030)
pH: 7.5 (ref 5.0–8.0)

## 2021-09-01 LAB — COMPREHENSIVE METABOLIC PANEL
ALT: 40 U/L (ref 0–44)
AST: 31 U/L (ref 15–41)
Albumin: 4.4 g/dL (ref 3.5–5.0)
Alkaline Phosphatase: 100 U/L (ref 38–126)
Anion gap: 11 (ref 5–15)
BUN: 11 mg/dL (ref 6–20)
CO2: 25 mmol/L (ref 22–32)
Calcium: 9.5 mg/dL (ref 8.9–10.3)
Chloride: 101 mmol/L (ref 98–111)
Creatinine, Ser: 0.59 mg/dL (ref 0.44–1.00)
GFR, Estimated: >60 mL/min (ref >60)
Glucose, Bld: 108 mg/dL — ABNORMAL HIGH (ref 70–99)
Potassium: 4.2 mmol/L (ref 3.5–5.1)
Sodium: 137 mmol/L (ref 135–145)
Total Bilirubin: 0.6 mg/dL (ref 0.3–1.2)
Total Protein: 7.6 g/dL (ref 6.5–8.1)

## 2021-09-01 LAB — CBC
HCT: 41.4 % (ref 36.0–46.0)
Hemoglobin: 13.6 g/dL (ref 12.0–15.0)
MCH: 25.6 pg — ABNORMAL LOW (ref 26.0–34.0)
MCHC: 32.9 g/dL (ref 30.0–36.0)
MCV: 78 fL — ABNORMAL LOW (ref 80.0–100.0)
Platelets: 323 10*3/uL (ref 150–400)
RBC: 5.31 MIL/uL — ABNORMAL HIGH (ref 3.87–5.11)
RDW: 17.2 % — ABNORMAL HIGH (ref 11.5–15.5)
WBC: 7.1 10*3/uL (ref 4.0–10.5)
nRBC: 0 % (ref 0.0–0.2)

## 2021-09-01 LAB — LIPASE, BLOOD: Lipase: 30 U/L (ref 11–51)

## 2021-09-01 LAB — HCG, QUANTITATIVE, PREGNANCY: hCG, Beta Chain, Quant, S: 1 m[IU]/mL (ref <5)

## 2021-09-01 LAB — POC URINE PREG, ED: Preg Test, Ur: NEGATIVE

## 2021-09-01 MED ORDER — OXYCODONE HCL 5 MG PO TABS
5.0000 mg | ORAL_TABLET | Freq: Once | ORAL | Status: AC
  Administered 2021-09-01: 5 mg via ORAL
  Filled 2021-09-01: qty 1

## 2021-09-01 MED ORDER — SODIUM CHLORIDE 0.9 % IV BOLUS
1000.0000 mL | Freq: Once | INTRAVENOUS | Status: AC
  Administered 2021-09-01: 1000 mL via INTRAVENOUS

## 2021-09-01 MED ORDER — OXYCODONE HCL 5 MG PO TABS: 5.0000 mg | ORAL_TABLET | Freq: Four times a day (QID) | ORAL | 0 refills | Status: AC | PRN

## 2021-09-01 MED ORDER — TAMSULOSIN HCL 0.4 MG PO CAPS: 0.4000 mg | ORAL_CAPSULE | Freq: Every day | ORAL | 0 refills | Status: AC

## 2021-09-01 MED ORDER — OXYCODONE HCL 5 MG PO TABS
5.0000 mg | ORAL_TABLET | Freq: Four times a day (QID) | ORAL | 0 refills | Status: DC | PRN
Start: 2021-09-01 — End: 2021-09-01

## 2021-09-01 MED ORDER — ACETAMINOPHEN 500 MG PO TABS
1000.0000 mg | ORAL_TABLET | Freq: Once | ORAL | Status: AC
  Administered 2021-09-01: 1000 mg via ORAL
  Filled 2021-09-01: qty 2

## 2021-09-01 MED ORDER — ONDANSETRON HCL 4 MG/2ML IJ SOLN
4.0000 mg | Freq: Once | INTRAMUSCULAR | Status: AC
  Administered 2021-09-01: 4 mg via INTRAVENOUS
  Filled 2021-09-01: qty 2

## 2021-09-01 MED ORDER — ONDANSETRON 4 MG PO TBDP: 4.0000 mg | ORAL_TABLET | Freq: Three times a day (TID) | ORAL | 0 refills | Status: DC | PRN

## 2021-09-01 MED ORDER — TAMSULOSIN HCL 0.4 MG PO CAPS: 0.4000 mg | ORAL_CAPSULE | Freq: Every day | ORAL | 0 refills | Status: DC

## 2021-09-01 MED ORDER — KETOROLAC TROMETHAMINE 30 MG/ML IJ SOLN
30.0000 mg | Freq: Once | INTRAMUSCULAR | Status: AC
  Administered 2021-09-01: 30 mg via INTRAVENOUS
  Filled 2021-09-01: qty 1

## 2021-09-01 NOTE — Discharge Instructions (Addendum)
You have a kidney stone. See report below.   Take ibuprofen 600mg  every 8 hours daily (as long as you are not on any other blood thinners or have kidney disease) Take tylenol 1g every 8 hours daily. Take oxycodone for breakthrough pain. Do not drive, work, or operate machinery while on this.  Take zofran to help with nausea. Take Flomax to help dilate uretha. Call urology number above to schedule outpatient appointment. Return to ED for fevers, unable to keep food down, or any other concerns.   CALL YOUR OB TO have your IUD replaced.    Hepatobiliary: No focal liver abnormality is seen. No gallstones, gallbladder wall thickening, or biliary dilatation.   Pancreas: Unremarkable. No pancreatic ductal dilatation or surrounding inflammatory changes.   Spleen: Normal in size without focal abnormality.   Adrenals/Urinary Tract: Normal adrenal glands. Punctate stone noted within the interpolar collecting system of the left kidney, image 109/5. No left-sided hydronephrosis or mass noted. Multiple small stones scratch set there 2 small stones within the interpolar and inferior pole right kidney collecting system which measure up to 2-3 mm. There is right-sided nephromegaly, hydronephrosis and hydroureter. Stone at the right UVJ is identified measuring 5 mm, image 78/2. Urinary bladder appears collapsed.   Stomach/Bowel: Stomach is within normal limits. Appendix appears normal. No evidence of bowel wall thickening, distention, or inflammatory changes.   Vascular/Lymphatic: No significant vascular findings are present. No enlarged abdominal or pelvic lymph nodes.   Reproductive: There is an IUD in-situ. The IUD appears malpositioned with the left arm extending through the myometrium into the peritoneal cavity, image 72/2. The right arm appears to be within the right cornua region near the fundus. No adnexal mass identified   Other: No abdominal wall hernia or abnormality. No  abdominopelvic ascites.   Musculoskeletal: No acute or significant osseous findings.   IMPRESSION: 1. Right-sided hydronephrosis and hydroureter secondary to 5 mm right UVJ calculus. 2. Bilateral nephrolithiasis. 3. Malpositioned IUD with the left arm extending through the myometrium into the peritoneal cavity. The right arm appears to be within the in the right cornua region.  Take oxycodone as prescribed. Do not drink alcohol, drive or participate in any other potentially dangerous activities while taking this medication as it may make you sleepy. Do not take this medication with any other sedating medications, either prescription or over-the-counter. If you were prescribed Percocet or Vicodin, do not take these with acetaminophen (Tylenol) as it is already contained within these medications.  This medication is an opiate (or narcotic) pain medication and can be habit forming. Use it as little as possible to achieve adequate pain control. Do not use or use it with extreme caution if you have a history of opiate abuse or dependence. If you are on a pain contract with your primary care doctor or a pain specialist, be sure to let them know you were prescribed this medication today from the Emergency Department. This medication is intended for your use only - do not give any to anyone else and keep it in a secure place where nobody else, especially children, have access to it.

## 2021-09-01 NOTE — ED Provider Notes (Signed)
HPI: Pt is a 25 y.o. female who presents with complaints of R flank pain  The patient p/w  R flank pain 2 hours ago. History of kidneys tones 2 month post partum   ROS: Denies fever  Past Medical History:  Diagnosis Date   Acne vulgaris 05/12/2015   ADHD (attention deficit hyperactivity disorder)    Anxiety    Arthritis    history of this in her knees   Depression    Depression, major, single episode, mild (HCC) 04/23/2013   Generalized anxiety disorder 11/06/2013   Genital herpes 11/2017   pos HSV 2 culture   Gestational hypertension, third trimester 06/13/2021   Renal stone 01/21/2019   Rhinitis, allergic 11/10/2015   Vaccine for human papilloma virus (HPV) types 6, 11, 16, and 18 administered    There were no vitals filed for this visit.  Focused Physical Exam: Gen: No acute distress Head: atraumatic, normocephalic Eyes: Extraocular movements grossly intact; conjunctiva clear CV: RRR Lung: No increased WOB, no stridor GI: ND, no obvious masses Neuro: Alert and awake R flank tenderness   Medical Decision Making and Plan: Given the patient's initial medical screening exam, the following diagnostic evaluation has been ordered. The patient will be placed in the appropriate treatment space, once one is available, to complete the evaluation and treatment. I have discussed the plan of care with the patient and I have advised the patient that an ED physician or mid-level practitioner will reevaluate their condition after the test results have been received, as the results may give them additional insight into the type of treatment they may need.   Diagnostics: labs, CT   Treatments: none immediately   Concha Se, MD 09/01/21 1327

## 2021-09-01 NOTE — ED Triage Notes (Signed)
C/O right flank pain x 2 hours.  States has history of kidney stones

## 2021-09-01 NOTE — ED Provider Notes (Signed)
[facility] Emergency Department Provider Note  ____________________________________________   Event Date/Time   First MD Initiated Contact with Patient 09/01/21 1500     (approximate)  I have reviewed the triage vital signs    HISTORY  Chief Complaint Flank Pain    HPI Judy Miller is a 25 y.o. female who presents with flank pain. 2 days of right flank pain. 2 month PP from baby at [facility]. Not breast feeding. No urinary symptoms. H/o kidney stones. Feels similar. Had IUD placed. Associated with n/v.  Pain severe, constant, nothing makes it better or worse.      Past Medical History:  Diagnosis Date   Acne vulgaris 05/12/2015   ADHD (attention deficit hyperactivity disorder)    Anxiety    Arthritis    history of this in her knees   Depression    Depression, major, single episode, mild (HCC) 04/23/2013   Generalized anxiety disorder 11/06/2013   Genital herpes 11/2017   pos HSV 2 culture   Gestational hypertension, third trimester 06/13/2021   Renal stone 01/21/2019   Rhinitis, allergic 11/10/2015   Vaccine for human papilloma virus (HPV) types 6, 11, 16, and 18 administered     Patient Active Problem List   Diagnosis Date Noted   Cough 08/10/2021   Ear pain, bilateral 08/10/2021   Postpartum anxiety 07/27/2021   S/P primary low transverse C-section 06/16/2021   Tachycardia 06/15/2021   Fall due to stumbling 03/20/2021   History of nephrolithiasis 03/07/2021   History of herpes genitalis 12/13/2020   Anxiety and depression 05/12/2015   ADHD (attention deficit hyperactivity disorder)     Past Surgical History:  Procedure Laterality Date   CESAREAN SECTION N/A 06/16/2021   Procedure: CESAREAN SECTION;  Surgeon: Federico Flake, MD;  Location: [facility] LD ORS;  Service: Obstetrics;  Laterality: N/A;    Prior to Admission medications   Medication Sig Start Date End Date Taking? Authorizing Provider  famotidine (PEPCID) 20 MG  tablet Take 1 tablet (20 mg total) by mouth 2 (two) times daily. For cough 08/10/21   Doreene Nest, NP  hydrOXYzine (ATARAX/VISTARIL) 25 MG tablet Take 1 tablet (25 mg total) by mouth 4 (four) times daily as needed for anxiety. 07/04/21   Anyanwu, Jethro Bastos, MD  ibuprofen (ADVIL) 600 MG tablet Take 1 tablet (600 mg total) by mouth every 6 (six) hours as needed for mild pain or moderate pain. Patient not taking: Reported on 08/10/2021 06/18/21   Aviva Signs, CNM  Prenatal Vit-Fe Fumarate-FA (MULTIVITAMIN-PRENATAL) 27-0.8 MG TABS tablet Take 1 tablet by mouth daily at 12 noon.    [provider]  sertraline (ZOLOFT) 50 MG tablet Take 1 tablet (50 mg total) by mouth daily. 07/27/21   Federico Flake, MD  valACYclovir (VALTREX) 1000 MG tablet Take 1 tablet (1,000 mg total) by mouth daily. 05/16/21   Anyanwu, Jethro Bastos, MD    Allergies Cod [fish allergy], Fish-derived products, Other, and Shellfish allergy  Family History  Problem Relation Age of Onset   Arthritis Mother    Varicose Veins Maternal Grandmother    Heart disease Paternal Grandmother    Varicose Veins Paternal Grandmother     Social History Social History   Tobacco Use   Smoking status: Every Day    Types: E-cigarettes   Smokeless tobacco: Never  Vaping Use   Vaping Use: Every day   Substances: Nicotine  Substance Use Topics   Alcohol use: Yes  Alcohol/week: 0.0 standard drinks    Comment: barely   Drug use: No    Comment: history of       Review of Systems Constitutional: No fever/chills Eyes: No visual changes. ENT: No sore throat. Cardiovascular: Denies chest pain. Respiratory: no SOB. No cough Gastrointestinal: No abdominal pain.  No nausea, no vomiting.  No diarrhea.  No constipation. Genitourinary: no severe blood in urine Musculoskeletal: + flank pain Skin: Negative for rash. Neurological: Negative for headaches, focal weakness or numbness. All other ROS  negative ____________________________________________   PHYSICAL EXAM:  VITAL SIGNS: ED Triage Vitals  Enc Vitals Group     BP 09/01/21 1329 (!) 132/92     Pulse Rate 09/01/21 1329 87     Resp 09/01/21 1329 18     Temp 09/01/21 1329 98.2 F (36.8 C)     Temp Source 09/01/21 1329 Oral     SpO2 09/01/21 1329 99 %     Weight 09/01/21 1310 164 lb 14.5 oz (74.8 kg)     Height 09/01/21 1310 5\' 6"  (1.676 m)     Head Circumference --      Peak Flow --      Pain Score 09/01/21 1310 8     Pain Loc --      Pain Edu? --      Excl. in GC? --     Constitutional: Alert and oriented. Discomfort noted  Eyes: Conjunctivae are normal. EOMI. Head: Atraumatic. Nose: No congestion/rhinnorhea. Mouth/Throat: Mucous membranes are moist.   Neck: No stridor. Trachea Midline. FROM Cardiovascular: Normal rate, regular rhythm.  Good peripheral circulation. Respiratory: no audible stridor, work of breathing  Gastrointestinal: Soft and nontender. No distention.  Musculoskeletal: No lower extremity tenderness nor edema.  No joint effusions. Neurologic:  Normal speech and language. No gross focal neurologic deficits are appreciated.  Skin:  Skin is warm, dry and intact. No rash noted. Psychiatric: Mood and affect are normal. Speech and behavior are normal. GU: Deferred  Flank tenderness on R  ____________________________________________   LABS (all labs ordered are listed, but only abnormal results are displayed)  Labs Reviewed  COMPREHENSIVE METABOLIC PANEL - Abnormal; Notable for the following components:      Result Value   Glucose, Bld 108 (*)    All other components within normal limits  CBC - Abnormal; Notable for the following components:   RBC 5.31 (*)    MCV 78.0 (*)    MCH 25.6 (*)    RDW 17.2 (*)    All other components within normal limits  URINALYSIS, COMPLETE (UACMP) WITH MICROSCOPIC - Abnormal; Notable for the following components:   Hgb urine dipstick MODERATE (*)    Protein,  ur 30 (*)    Bacteria, UA RARE (*)    All other components within normal limits  LIPASE, BLOOD  HCG, QUANTITATIVE, PREGNANCY  POC URINE PREG, ED   ____________________________________________   RADIOLOGY   Official radiology report(s): CT Renal Stone Study  Result Date: 09/01/2021 CLINICAL DATA:  Flank pain.  Evaluate kidney stone. EXAM: CT ABDOMEN AND PELVIS WITHOUT CONTRAST TECHNIQUE: Multidetector CT imaging of the abdomen and pelvis was performed following the standard protocol without IV contrast. COMPARISON:  01/15/2019 FINDINGS: Lower chest: No acute abnormality. Hepatobiliary: No focal liver abnormality is seen. No gallstones, gallbladder wall thickening, or biliary dilatation. Pancreas: Unremarkable. No pancreatic ductal dilatation or surrounding inflammatory changes. Spleen: Normal in size without focal abnormality. Adrenals/Urinary Tract: Normal adrenal glands. Punctate stone noted within the interpolar  collecting system of the left kidney, image 109/5. No left-sided hydronephrosis or mass noted. Multiple small stones scratch set there 2 small stones within the interpolar and inferior pole right kidney collecting system which measure up to 2-3 mm. There is right-sided nephromegaly, hydronephrosis and hydroureter. Stone at the right UVJ is identified measuring 5 mm, image 78/2. Urinary bladder appears collapsed. Stomach/Bowel: Stomach is within normal limits. Appendix appears normal. No evidence of bowel wall thickening, distention, or inflammatory changes. Vascular/Lymphatic: No significant vascular findings are present. No enlarged abdominal or pelvic lymph nodes. Reproductive: There is an IUD in-situ. The IUD appears malpositioned with the left arm extending through the myometrium into the peritoneal cavity, image 72/2. The right arm appears to be within the right cornua region near the fundus. No adnexal mass identified Other: No abdominal wall hernia or abnormality. No abdominopelvic  ascites. Musculoskeletal: No acute or significant osseous findings. IMPRESSION: 1. Right-sided hydronephrosis and hydroureter secondary to 5 mm right UVJ calculus. 2. Bilateral nephrolithiasis. 3. Malpositioned IUD with the left arm extending through the myometrium into the peritoneal cavity. The right arm appears to be within the in the right cornua region. Electronically Signed   By: Signa Kell M.D.   On: 09/01/2021 14:44    ____________________________________________   PROCEDURES  Procedure(s) performed (including Critical Care):  Procedures   ____________________________________________   INITIAL IMPRESSION / ASSESSMENT AND PLAN / ED COURSE  Judy Miller was evaluated in Emergency Department on 09/01/2021 for the symptoms described in the history of present illness. She was evaluated in the context of the global COVID-19 pandemic, which necessitated consideration that the patient might be at risk for infection with the SARS-CoV-2 virus that causes COVID-19. Institutional protocols and algorithms that pertain to the evaluation of patients at risk for COVID-19 are in a state of rapid change based on information released by regulatory bodies including the CDC and federal and state organizations. These policies and algorithms were followed during the patient's care in the ED.     Pt presents with flank pain.  Suspect this is most likely kidney stone. Will get labs to evaluate for electrolyte abnormalities/AKI.  CT scan ordered to evaluate for kidney stone.  Also consider appendicitis vs SBO although less likely given description of pain.  Will get UA to evaluate for UTI/pyelo.    CT confirms kidney stone. + obstruction.   No evidence of infected stone. No fevers. UA re-assuring.   Reviewed labs--No severe AKI.   3:01 PM pt still waiting for room but given fluids, zofran, pain meds.  CT also showed concern IUD not in place and in the peritoneum slightly. D.w Dr. Logan Bores [facility]  should still be good BC but needs to be removed non emergently. She can f/u with her OB to have it removed and replaced.  Does not need to be admitted today for that.   4:11 PM no rooms but pt feeling better and requesting dc home. Understands to return for fevers. Tolerating drinking   D/w pt rx with ibuprofen/tylenol and oxycodone for breath though pain.  Understands not to drive or work while on oxycodone.  Given zofran/flomax and urology number for follow up.  Pt expressed understanding and felt comfortable with dc home.        ____________________________________________   FINAL CLINICAL IMPRESSION(S) / ED DIAGNOSES   Final diagnoses:  Kidney stone  Malpositioned intrauterine device (IUD), initial encounter      MEDICATIONS GIVEN DURING THIS VISIT:  Medications  ondansetron (ZOFRAN)  injection 4 mg (has no administration in time range)  ketorolac (TORADOL) 30 MG/ML injection 30 mg (has no administration in time range)  oxyCODONE (Oxy IR/ROXICODONE) immediate release tablet 5 mg (has no administration in time range)  acetaminophen (TYLENOL) tablet 1,000 mg (has no administration in time range)  sodium chloride 0.9 % bolus 1,000 mL (has no administration in time range)     ED Discharge Orders          Ordered    tamsulosin (FLOMAX) 0.4 MG CAPS capsule  Daily        09/01/21 1610    ondansetron (ZOFRAN ODT) 4 MG disintegrating tablet  Every 8 hours PRN        09/01/21 1610    oxyCODONE (ROXICODONE) 5 MG immediate release tablet  Every 6 hours PRN        09/01/21 1610             Note:  This document was prepared using Dragon voice recognition software and may include unintentional dictation errors.   Concha Se, MD 09/01/21 [phone]

## 2021-09-05 ENCOUNTER — Ambulatory Visit (INDEPENDENT_AMBULATORY_CARE_PROVIDER_SITE_OTHER): Payer: BC Managed Care – PPO | Admitting: Family Medicine

## 2021-09-05 ENCOUNTER — Encounter: Payer: Self-pay | Admitting: Family Medicine

## 2021-09-05 ENCOUNTER — Other Ambulatory Visit: Payer: Self-pay

## 2021-09-05 VITALS — BP 135/87 | HR 67 | Wt 171.0 lb

## 2021-09-05 DIAGNOSIS — T8332XA Displacement of intrauterine contraceptive device, initial encounter: Secondary | ICD-10-CM | POA: Diagnosis not present

## 2021-09-05 DIAGNOSIS — Z30432 Encounter for removal of intrauterine contraceptive device: Secondary | ICD-10-CM | POA: Diagnosis not present

## 2021-09-05 DIAGNOSIS — Z30016 Encounter for initial prescription of transdermal patch hormonal contraceptive device: Secondary | ICD-10-CM | POA: Diagnosis not present

## 2021-09-07 ENCOUNTER — Encounter: Payer: Self-pay | Admitting: Family Medicine

## 2021-09-07 MED ORDER — XULANE 150-35 MCG/24HR TD PTWK: 1.0000 | MEDICATED_PATCH | TRANSDERMAL | 12 refills | Status: DC

## 2021-09-07 NOTE — Progress Notes (Signed)
   GYNECOLOGY PROBLEM  VISIT ENCOUNTER NOTE  Subjective:   Judy Miller is a 25 y.o. [id number] female here for a a problem GYN visit.  Current complaints: Went to hospital for abdominal pain, found to have kidney stone. This pain has improved and incidental CT finding of IUD migration with perforation of LNG IUD arm into peritoneal cavity. Body of IUD in uterus.   Denies abnormal vaginal bleeding, discharge, pelvic pain, problems with intercourse or other gynecologic concerns.     Reviewed CT : Reproductive: There is an IUD in-situ. The IUD appears malpositioned with the left arm extending through the myometrium into the peritoneal cavity, image 72/2. The right arm appears to be within the right cornua region near the fundus. No adnexal mass identified   IMPRESSION: 1. Right-sided hydronephrosis and hydroureter secondary to 5 mm right UVJ calculus. 2. Bilateral nephrolithiasis. 3. Malpositioned IUD with the left arm extending through the myometrium into the peritoneal cavity. The right arm appears to be within the in the right cornua region.  Gynecologic History No LMP recorded. Contraception: IUD  Health Maintenance Due  Topic Date Due   COVID-19 Vaccine (1) Never done   The following portions of the patient's history were reviewed and updated as appropriate: allergies, current medications, past family history, past medical history, past social history, past surgical history and problem list.  Review of Systems Pertinent items are noted in HPI.   Objective:  BP 135/87   Pulse 67   Wt 171 lb (77.6 kg)   BMI 27.60 kg/m  Gen: well appearing, NAD HEENT: no scleral icterus CV: RR Lung: Normal WOB Ext: warm well perfused  PELVIC: Normal appearing external genitalia; normal appearing vaginal mucosa and cervix.  No abnormal discharge noted.  IUD strings easily visualized.  Normal uterine size, no other palpable masses, no uterine or adnexal tenderness.   IUD Removal   Patient identified, informed consent performed, consent signed.  Patient was in the dorsal lithotomy position, normal external genitalia was noted.  A speculum was placed in the patient's vagina, normal discharge was noted, no lesions. The cervix was visualized, no lesions, no abnormal discharge.  The strings of the IUD were grasped and pulled using ring forceps. The IUD was removed in its entirety.Patient tolerated the procedure well.     Assessment and Plan:  1. Encounter for initial prescription of transdermal patch hormonal contraceptive device - norelgestromin-ethinyl estradiol [employer]) 150-35 MCG/24HR transdermal patch; Place 1 patch onto the skin once a week.  Dispense: 3 patch; Refill: 12  2. Encounter for IUD removal Removed easily   3. IUD migration, initial encounter Reviewed scan which is concerning and I recommended removal and patient agreed to attempt. Luckily IUD strings were easily found, and were appropriate length.   Please refer to After Visit Summary for other counseling recommendations.   Return in about 1 year (around 09/05/2022) for Yearly wellness exam.  Federico Flake, MD, MPH, ABFM Attending Physician Faculty Practice- [facility]

## 2021-09-23 ENCOUNTER — Other Ambulatory Visit: Payer: Self-pay | Admitting: Family Medicine

## 2021-09-23 DIAGNOSIS — F418 Other specified anxiety disorders: Secondary | ICD-10-CM

## 2021-12-06 ENCOUNTER — Emergency Department
Admission: EM | Admit: 2021-12-06 | Discharge: 2021-12-06 | Discharge status: Against Medical Advice | Payer: BC Managed Care – PPO | Source: Home / Self Care

## 2021-12-07 ENCOUNTER — Other Ambulatory Visit: Payer: Self-pay

## 2021-12-07 ENCOUNTER — Emergency Department: Payer: BC Managed Care – PPO

## 2021-12-07 ENCOUNTER — Emergency Department
Admission: EM | Admit: 2021-12-07 | Discharge: 2021-12-07 | Disposition: A | Discharge status: Home / Self Care | Payer: BC Managed Care – PPO | Attending: Emergency Medicine | Admitting: Emergency Medicine

## 2021-12-07 DIAGNOSIS — R0789 Other chest pain: Secondary | ICD-10-CM | POA: Diagnosis not present

## 2021-12-07 DIAGNOSIS — R079 Chest pain, unspecified: Secondary | ICD-10-CM | POA: Diagnosis not present

## 2021-12-07 LAB — CBC
HCT: 42.4 % (ref 36.0–46.0)
Hemoglobin: 14.1 g/dL (ref 12.0–15.0)
MCH: 27.8 pg (ref 26.0–34.0)
MCHC: 33.3 g/dL (ref 30.0–36.0)
MCV: 83.6 fL (ref 80.0–100.0)
Platelets: 363 10*3/uL (ref 150–400)
RBC: 5.07 MIL/uL (ref 3.87–5.11)
RDW: 13.5 % (ref 11.5–15.5)
WBC: 9.3 10*3/uL (ref 4.0–10.5)
nRBC: 0 % (ref 0.0–0.2)

## 2021-12-07 LAB — D-DIMER, QUANTITATIVE: D-Dimer, Quant: 0.29 ug/mL-FEU (ref 0.00–0.50)

## 2021-12-07 LAB — BASIC METABOLIC PANEL
Anion gap: 10 (ref 5–15)
BUN: 11 mg/dL (ref 6–20)
CO2: 25 mmol/L (ref 22–32)
Calcium: 9.8 mg/dL (ref 8.9–10.3)
Chloride: 100 mmol/L (ref 98–111)
Creatinine, Ser: 0.4 mg/dL — ABNORMAL LOW (ref 0.44–1.00)
GFR, Estimated: >60 mL/min (ref >60)
Glucose, Bld: 86 mg/dL (ref 70–99)
Potassium: 3.9 mmol/L (ref 3.5–5.1)
Sodium: 135 mmol/L (ref 135–145)

## 2021-12-07 LAB — TROPONIN I (HIGH SENSITIVITY)
Troponin I (High Sensitivity): <2 ng/L (ref <18)
Troponin I (High Sensitivity): <2 ng/L (ref <18)

## 2021-12-07 MED ORDER — IBUPROFEN 600 MG PO TABS
600.0000 mg | ORAL_TABLET | Freq: Once | ORAL | Status: AC
  Administered 2021-12-07: 600 mg via ORAL
  Filled 2021-12-07: qty 1

## 2021-12-07 MED ORDER — ACETAMINOPHEN 500 MG PO TABS
1000.0000 mg | ORAL_TABLET | Freq: Once | ORAL | Status: AC
  Administered 2021-12-07: 1000 mg via ORAL
  Filled 2021-12-07: qty 2

## 2021-12-07 NOTE — Discharge Instructions (Signed)
Please take Tylenol and ibuprofen/Advil for your pain.  It is safe to take them together, or to alternate them every few hours.  Take up to 1000mg of Tylenol at a time, up to 4 times per day.  Do not take more than 4000 mg of Tylenol in 24 hours.  For ibuprofen, take 400-600 mg, 4-5 times per day. ° ° °

## 2021-12-07 NOTE — ED Triage Notes (Signed)
Pt c/o chest pain, LUQ pain, left back pain and intermittent arm numbness x 2 weeks.  Pt states she is being treated for post-partum depression/anxiety w/zoloft and this feels similar but not the same.  Pt states she has occasional shortness of breath, denies nausea/vomiting.

## 2021-12-07 NOTE — ED Provider Notes (Signed)
[facility] Provider Note    Event Date/Time   First MD Initiated Contact with Patient 12/07/21 2128     (approximate)   History   Chest Pain   HPI  Judy Miller is a 26 y.o. female who presents to the ED for evaluation of Chest Pain Review outpatient OB/GYN visit from 10/3.  G1, P1.  History of nephrolithiasis.  IUD recently removed and she was started on hormonal transdermal contraception.   Patient presents to the ED for evaluation left-sided chest pain intermittently over the past couple weeks.  She reports that she has had a lot of anxiety over the past few months after the recent birth of her child, controlled on SSRI.  Denies SI, HI or AVH.  Patient reports intermittent left-sided pain, primarily to her trapezius and anterior chest that is sharp in nature and lasting a matter of minutes before self resolving.  Happens almost every day.  Denies any dyspnea, dyspnea on exertion, syncope, hemoptysis.  Denies any chest pain right now, but does report anxiety and concerned about her heart.  Physical Exam   Triage Vital Signs: ED Triage Vitals  Enc Vitals Group     BP 12/07/21 1846 (!) 141/97     Pulse Rate 12/07/21 1846 78     Resp 12/07/21 1846 18     Temp 12/07/21 1846 98.4 F (36.9 C)     Temp Source 12/07/21 1846 Oral     SpO2 12/07/21 1846 100 %     Weight 12/07/21 1846 175 lb (79.4 kg)     Height 12/07/21 1846 5\' 6"  (1.676 m)     Head Circumference --      Peak Flow --      Pain Score 12/07/21 1907 3     Pain Loc --      Pain Edu? --      Excl. in [place]? --     Most recent vital signs: Vitals:   12/07/21 1846  BP: (!) 141/97  Pulse: 78  Resp: 18  Temp: 98.4 F (36.9 C)  SpO2: 100%    General: Awake, no distress. CV:  Good peripheral perfusion. RRR Resp:  Normal effort.  Abd:  No distention.  MSK:  No deformity noted.  Neuro:  No focal deficits appreciated. Other:     ED Results / Procedures / Treatments    Labs (all labs ordered are listed, but only abnormal results are displayed) Labs Reviewed  BASIC METABOLIC PANEL - Abnormal; Notable for the following components:      Result Value   Creatinine, Ser 0.40 (*)    All other components within normal limits  CBC  D-DIMER, QUANTITATIVE  POC URINE PREG, ED  TROPONIN I (HIGH SENSITIVITY)  TROPONIN I (HIGH SENSITIVITY)    EKG  Sinus rhythm, rate of 72 bpm.  Normal axis and intervals.  No evidence of acute ischemia.  RADIOLOGY CXR reviewed by me without evidence of acute cardiopulmonary pathology.  Official radiology report(s): DG Chest 2 View  Result Date: 12/07/2021 CLINICAL DATA:  Chest pain EXAM: CHEST - 2 VIEW COMPARISON:  10/06/2020 FINDINGS: The heart size and mediastinal contours are within normal limits. Both lungs are clear. The visualized skeletal structures are unremarkable. IMPRESSION: No active cardiopulmonary disease. Electronically Signed   By: Fidela Salisbury M.D.   On: 12/07/2021 19:54    PROCEDURES and INTERVENTIONS:  Procedures  Medications  acetaminophen (TYLENOL) tablet 1,000 mg (1,000 mg Oral Given 12/07/21 2216)  ibuprofen (  ADVIL) tablet 600 mg (600 mg Oral Given 12/07/21 2216)     IMPRESSION / MDM / ASSESSMENT AND PLAN / ED COURSE  I reviewed the triage vital signs and the nursing notes.  26 year old female presents to the ED with atypical chest pains ultimately amenable to outpatient management.  Normal vitals on room air without hypoxia or tachycardia.  She looks clinically well without evidence of acute pathology on examination.  No neurologic or vascular deficits.  Work-up is quite benign.  EKG is nonischemic and she has no evidence of ACS.  2 negative troponins and negative D-dimer.  Metabolic panel with no electrolyte derangements and CBC without anemia.  We will discharge with return precautions.  Clinical Course as of 12/07/21 2311  Wed Dec 07, 2021  2209 Due to patient being on estrogen containing  contraception, and unable to [facility] the patient out.  She looks quite well to me and I doubt PE and she denies discussed the possibility of this.  We discussed D-dimer testing and she is agreeable. [DS]  2311 Reassessed and we discussed reassuring work-up with negative troponins and D-dimer.  She is pain-free.  We discussed management at home and return precautions for the ED. [DS]    Clinical Course User Index [DS] Vladimir Crofts, MD     FINAL CLINICAL IMPRESSION(S) / ED DIAGNOSES   Final diagnoses:  Other chest pain     Rx / DC Orders   ED Discharge Orders     None        Note:  This document was prepared using Dragon voice recognition software and may include unintentional dictation errors.   Vladimir Crofts, MD 12/07/21 2312

## 2022-02-06 ENCOUNTER — Other Ambulatory Visit (HOSPITAL_COMMUNITY)
Admission: RE | Admit: 2022-02-06 | Discharge: 2022-02-06 | Disposition: A | Discharge status: Home / Self Care | Payer: BC Managed Care – PPO | Source: Ambulatory Visit | Attending: Family Medicine | Admitting: Family Medicine

## 2022-02-06 ENCOUNTER — Ambulatory Visit (INDEPENDENT_AMBULATORY_CARE_PROVIDER_SITE_OTHER): Payer: BC Managed Care – PPO | Admitting: *Deleted

## 2022-02-06 ENCOUNTER — Other Ambulatory Visit: Payer: Self-pay

## 2022-02-06 VITALS — BP 136/86

## 2022-02-06 DIAGNOSIS — N898 Other specified noninflammatory disorders of vagina: Secondary | ICD-10-CM | POA: Diagnosis not present

## 2022-02-06 DIAGNOSIS — B3731 Acute candidiasis of vulva and vagina: Secondary | ICD-10-CM | POA: Insufficient documentation

## 2022-02-06 NOTE — Progress Notes (Cosign Needed)
SUBJECTIVE:  ?26 y.o. female complains of white and malodorous vaginal discharge for few day(s). ?Denies abnormal vaginal bleeding or significant pelvic pain or ?fever. No UTI symptoms. Denies history of known exposure to STD. ? ?No LMP recorded. ? ?OBJECTIVE:  ?She appears well, afebrile. ?Urine dipstick: not done. ? ?ASSESSMENT:  ?Vaginal Discharge  ?Vaginal Odor ? ? ?PLAN:  ?BVAG, CVAG probe sent to lab. ?Treatment: To be determined once lab results are received ?ROV prn if symptoms persist or worsen.  ?

## 2022-02-06 NOTE — Progress Notes (Signed)
Attestation of Attending Supervision of clinical support staff: I agree with the care provided to this patient and was available for any consultation.  I have reviewed the RN's note and chart. I was available for consult and to see the patient if needed.   Glenola Wheat MD MPH Attending Physician Faculty Practice- [facility]  

## 2022-02-08 LAB — CERVICOVAGINAL ANCILLARY ONLY
Bacterial Vaginitis (gardnerella): NEGATIVE
Candida Glabrata: NEGATIVE
Candida Vaginitis: POSITIVE — AB
Comment: NEGATIVE
Comment: NEGATIVE
Comment: NEGATIVE

## 2022-02-08 MED ORDER — FLUCONAZOLE 150 MG PO TABS: 150.0000 mg | ORAL_TABLET | Freq: Once | ORAL | 1 refills | Status: AC

## 2022-02-08 NOTE — Addendum Note (Signed)
Addended by: Geanie Berlin on: 02/08/2022 04:05 PM ? ? Modules accepted: Orders ? ?

## 2022-02-24 ENCOUNTER — Ambulatory Visit (INDEPENDENT_AMBULATORY_CARE_PROVIDER_SITE_OTHER): Payer: BC Managed Care – PPO | Admitting: Primary Care

## 2022-02-24 ENCOUNTER — Other Ambulatory Visit: Payer: Self-pay

## 2022-02-24 VITALS — BP 126/80 | HR 107 | Ht 66.0 in | Wt 181.2 lb

## 2022-02-24 DIAGNOSIS — M25532 Pain in left wrist: Secondary | ICD-10-CM | POA: Diagnosis not present

## 2022-02-24 DIAGNOSIS — M25531 Pain in right wrist: Secondary | ICD-10-CM | POA: Diagnosis not present

## 2022-02-24 DIAGNOSIS — M25571 Pain in right ankle and joints of right foot: Secondary | ICD-10-CM

## 2022-02-24 DIAGNOSIS — F411 Generalized anxiety disorder: Secondary | ICD-10-CM

## 2022-02-24 DIAGNOSIS — M25572 Pain in left ankle and joints of left foot: Secondary | ICD-10-CM | POA: Diagnosis not present

## 2022-02-24 LAB — C-REACTIVE PROTEIN: CRP: 1.3 mg/dL (ref 0.5–20.0)

## 2022-02-24 LAB — SEDIMENTATION RATE: Sed Rate: 34 mm/hr — ABNORMAL HIGH (ref 0–20)

## 2022-02-24 MED ORDER — SERTRALINE HCL 100 MG PO TABS: 100.0000 mg | ORAL_TABLET | Freq: Every day | ORAL | 0 refills | Status: DC

## 2022-02-24 NOTE — Patient Instructions (Addendum)
Stop by the lab prior to leaving today. I will notify you of your results once received.  ? ?Schedule an appointment with our sports medicine doctor, Dr. Patsy Lager. ? ?We increase the dose of your sertraline (Zoloft) to 100 mg.  I sent a new prescription to your pharmacy.  Update me in 4 weeks. ? ?It was a pleasure to see you today! ? ?

## 2022-02-24 NOTE — Assessment & Plan Note (Signed)
Unclear etiology, does not appear to be carpal tunnel syndrome given lack of cardinal signs and lack of repetitive use of wrists for months. ? ?Will check labs today to rule out autoimmune arthritis. ? ?Recommended she schedule an appointment with our sports medicine physician. ?

## 2022-02-24 NOTE — Assessment & Plan Note (Signed)
Unclear etiology, no foot involvement.  ? ?Will check labs today to rule out autoimmune arthritis. ? ?Recommended she schedule an appointment with our sports medicine physician. ?

## 2022-02-24 NOTE — Assessment & Plan Note (Signed)
Uncontrolled on Zoloft 50 mg. ? ?Discussed options for treatment which include increasing Zoloft to 100 mg versus trial of another medication.  We agreed to increase her Zoloft dose to 100 mg and for her to update in about 4 weeks. ? ?ED notes from January 2023 reviewed. ?

## 2022-02-24 NOTE — Progress Notes (Signed)
? ?Subjective:  ? ? Patient ID: Judy Miller, female    DOB: 1996-11-05, 26 y.o.   MRN: [id number] ? ?HPI ? ?Marquelle Balow is a very pleasant 26 y.o. female with a history of anxiety who presents today to discuss anxiety and wrist pain. ? ?1) Anxiety: Currently managed on sertraline 50 mg per OB/GYN for postpartum anxiety. Long history of anxiety, previously treated with venlafaxine, fluoxetine, and hydroxyzine. She is postpartum since July 2022.  ? ?Evaluated in the ED on 12/07/21 for symptoms of chest pain and anxiety. Work up was benign for cardiac or PE cause.  ? ?Today she endorses symptoms of chest tightness, chest pain, bilateral arm numbness, warmth down her legs, worrying constantly, thinking worst case scenario. She felt that Zoloft helped initially, but it doesn't feel effective. She does find relieve from hydroxyzine.  ? ? ?  02/24/2022  ?  8:55 AM 06/14/2021  ?  4:27 PM 01/27/2021  ?  9:57 AM  ?PHQ9 SCORE ONLY  ?PHQ-9 Total Score 0 2 6  ? ? ?  02/24/2022  ?  8:56 AM 06/27/2021  ?  4:09 PM 06/14/2021  ?  4:27 PM 10/05/2020  ?  8:11 AM  ?GAD 7 : Generalized Anxiety Score  ?Nervous, Anxious, on Edge 3 1 1 3   ?Control/stop worrying 3 1 0 3  ?Worry too much - different things 3 2 1 3   ?Trouble relaxing 0 0 0 1  ?Restless 3 0 0 3  ?Easily annoyed or irritable 2 1 0 3  ?Afraid - awful might happen 3 1 0 3  ?Total GAD 7 Score 17 6 2 19   ?Anxiety Difficulty    Somewhat difficult  ? ?2) Joint Pain: Chronic to the wrists and ankles, bilaterally.  ? ?History of prior repetitive use of hands and wrists when working as a . Did a lot of waxing and rolling. Symptoms began during pregnancy last year, have continued since. She denies numbness, joint swelling, finger pain, stiffness, and repetitive use of her wrists in months.  ? ?Her ankle pain is mostly noticeable when first getting up in the morning, or when rising after sitting for a prolonged period of time. It takes her a few minutes to start  walking without pain and reduced ROM. She denies pain when walking or when up and about after a few minutes. She denies injury/trauma, swelling, foot pain.  ? ? ?Review of Systems  ?Musculoskeletal:  Positive for arthralgias. Negative for joint swelling, myalgias and neck pain.  ?Neurological:  Negative for numbness.  ?Psychiatric/Behavioral:  The patient is nervous/anxious.   ? ?   ? ? ?Past Medical History:  ?Diagnosis Date  ? Acne vulgaris 05/12/2015  ? ADHD (attention deficit hyperactivity disorder)   ? Anxiety   ? Arthritis   ? history of this in her knees  ? Depression   ? Depression, major, single episode, mild (HCC) 04/23/2013  ? Generalized anxiety disorder 11/06/2013  ? Genital herpes 11/2017  ? pos HSV 2 culture  ? Gestational hypertension, third trimester 06/13/2021  ? Renal stone 01/21/2019  ? Rhinitis, allergic 11/10/2015  ? Vaccine for human papilloma virus (HPV) types 6, 11, 16, and 18 administered   ? ? ?Social History  ? ?Socioeconomic History  ? Marital status: Single  ?  Spouse name: Not on file  ? Number of children: Not on file  ? Years of education: Not on file  ? Highest education level: Not on file  ?Occupational History  ?  Not on file  ?Tobacco Use  ? Smoking status: Every Day  ?  Types: E-cigarettes  ? Smokeless tobacco: Never  ?Vaping Use  ? Vaping Use: Every day  ? Substances: Nicotine  ?Substance and Sexual Activity  ? Alcohol use: Yes  ?  Alcohol/week: 0.0 standard drinks  ?  Comment: barely  ? Drug use: No  ?  Comment: history of   ? Sexual activity: Yes  ?  Birth control/protection: None  ?Other Topics Concern  ? Not on file  ?Social History Narrative  ? Single.  ? Graduated from HS in 2017.   ? Student in cosmetology school.  ? Enjoys spending time outdoors, playing with her dog.  ? ?Social Determinants of Health  ? ?Financial Resource Strain: Not on file  ?Food Insecurity: No Food Insecurity  ? Worried About Programme researcher, broadcasting/film/videounning Out of Food in the Last Year: Never true  ? Ran Out of Food in the  Last Year: Never true  ?Transportation Needs: No Transportation Needs  ? Lack of Transportation (Medical): No  ? Lack of Transportation (Non-Medical): No  ?Physical Activity: Not on file  ?Stress: Not on file  ?Social Connections: Not on file  ?Intimate Partner Violence: Not on file  ? ? ?Past Surgical History:  ?Procedure Laterality Date  ? CESAREAN SECTION N/A 06/16/2021  ? Procedure: CESAREAN SECTION;  Surgeon: Federico FlakeNewton, Kimberly Niles, MD;  Location: [facility];  Service: Obstetrics;  Laterality: N/A;  ? ? ?Family History  ?Problem Relation Age of Onset  ? Arthritis Mother   ? Varicose Veins Maternal Grandmother   ? Heart disease Paternal Grandmother   ? Varicose Veins Paternal Grandmother   ? ? ?Allergies  ?Allergen Reactions  ? Cod [Fish Allergy] Hives  ?  Grouper fish  ? Fish-Derived Products Hives  ?  Grouper fish  ? Other Hives and Other (See Comments)  ?  Red hair dye ?Crab legs ?Grouper fish  ? Shellfish Allergy Other (See Comments)  ?  Crab legs ?  ? ? ?Current Outpatient Medications on File Prior to Visit  ?Medication Sig Dispense Refill  ? famotidine (PEPCID) 20 MG tablet Take 1 tablet (20 mg total) by mouth 2 (two) times daily. For cough 60 tablet 0  ? hydrOXYzine (ATARAX/VISTARIL) 25 MG tablet Take 1 tablet (25 mg total) by mouth 4 (four) times daily as needed for anxiety. 30 tablet 2  ? ibuprofen (ADVIL) 600 MG tablet Take 1 tablet (600 mg total) by mouth every 6 (six) hours as needed for mild pain or moderate pain. 30 tablet 0  ? norelgestromin-ethinyl estradiol Burr Medico(XULANE) 150-35 MCG/24HR transdermal patch Place 1 patch onto the skin once a week. 3 patch 12  ? ondansetron (ZOFRAN ODT) 4 MG disintegrating tablet Take 1 tablet (4 mg total) by mouth every 8 (eight) hours as needed for nausea or vomiting. 20 tablet 0  ? Prenatal Vit-Fe Fumarate-FA (MULTIVITAMIN-PRENATAL) 27-0.8 MG TABS tablet Take 1 tablet by mouth daily at 12 noon.    ? valACYclovir (VALTREX) 1000 MG tablet Take 1 tablet (1,000 mg total) by  mouth daily. 60 tablet 3  ? ?No current facility-administered medications on file prior to visit.  ? ? ?BP 126/80   Pulse (!) 107   Ht 5\' 6"  (1.676 m)   Wt 181 lb 3.2 oz (82.2 kg)   LMP 02/23/2022   SpO2 96%   BMI 29.25 kg/m?  ?Objective:  ? Physical Exam ?Cardiovascular:  ?   Rate and Rhythm: Normal rate and  regular rhythm.  ?Pulmonary:  ?   Effort: Pulmonary effort is normal.  ?   Breath sounds: Normal breath sounds.  ?Musculoskeletal:  ?   Right wrist: No swelling or deformity. Normal range of motion.  ?   Left wrist: No swelling or deformity. Normal range of motion.  ?   Cervical back: Neck supple.  ?   Right ankle: No swelling or deformity. Normal range of motion.  ?   Left ankle: No swelling or deformity. Normal range of motion.  ?Skin: ?   General: Skin is warm and dry.  ?Neurological:  ?   Comments: Negative Tinel's and Phalen's signs.  ? ? ? ? ? ?   ?Assessment & Plan:  ? ? ? ? ?This visit occurred during the SARS-CoV-2 public health emergency.  Safety protocols were in place, including screening questions prior to the visit, additional usage of staff PPE, and extensive cleaning of exam room while observing appropriate contact time as indicated for disinfecting solutions.  ?

## 2022-02-26 LAB — CYCLIC CITRUL PEPTIDE ANTIBODY, IGG: Cyclic Citrullin Peptide Ab: 104 UNITS — ABNORMAL HIGH

## 2022-02-26 LAB — RHEUMATOID FACTOR: Rheumatoid fact SerPl-aCnc: <14 IU/mL (ref <14)

## 2022-02-28 DIAGNOSIS — M25531 Pain in right wrist: Secondary | ICD-10-CM

## 2022-02-28 DIAGNOSIS — M25571 Pain in right ankle and joints of right foot: Secondary | ICD-10-CM

## 2022-02-28 NOTE — Telephone Encounter (Signed)
Called patient she would like be sent for referral to rheumatology in [place]. Advised if she has not heard from them in 1-2 weeks let our office know/  ?

## 2022-02-28 NOTE — Telephone Encounter (Signed)
Noted  Referral placed.

## 2022-03-27 ENCOUNTER — Ambulatory Visit: Payer: BC Managed Care – PPO | Admitting: Family Medicine

## 2022-03-29 DIAGNOSIS — Z7960 Long term (current) use of unspecified immunomodulators and immunosuppressants: Secondary | ICD-10-CM | POA: Insufficient documentation

## 2022-03-29 DIAGNOSIS — M0609 Rheumatoid arthritis without rheumatoid factor, multiple sites: Secondary | ICD-10-CM | POA: Insufficient documentation

## 2022-04-24 ENCOUNTER — Ambulatory Visit: Payer: Medicaid Other | Admitting: Family Medicine

## 2022-05-16 ENCOUNTER — Ambulatory Visit
Admission: EM | Admit: 2022-05-16 | Discharge: 2022-05-16 | Disposition: A | Discharge status: Home / Self Care | Payer: Medicaid Other | Attending: Nurse Practitioner | Admitting: Nurse Practitioner

## 2022-05-16 DIAGNOSIS — R21 Rash and other nonspecific skin eruption: Secondary | ICD-10-CM

## 2022-05-16 MED ORDER — TRIAMCINOLONE ACETONIDE 0.1 % EX CREA: 1.0000 "application " | TOPICAL_CREAM | Freq: Two times a day (BID) | CUTANEOUS | 0 refills | Status: DC

## 2022-05-16 MED ORDER — FAMOTIDINE 20 MG PO TABS: 20.0000 mg | ORAL_TABLET | Freq: Every day | ORAL | 0 refills | Status: DC

## 2022-05-16 MED ORDER — PREDNISONE 10 MG (21) PO TBPK: ORAL_TABLET | Freq: Every day | ORAL | 0 refills | Status: DC

## 2022-05-16 MED ORDER — CETIRIZINE HCL 10 MG PO TABS: 10.0000 mg | ORAL_TABLET | Freq: Every day | ORAL | 0 refills | Status: DC

## 2022-05-16 MED ORDER — DEXAMETHASONE SODIUM PHOSPHATE 10 MG/ML IJ SOLN
10.0000 mg | INTRAMUSCULAR | Status: AC
  Administered 2022-05-16: 10 mg via INTRAMUSCULAR

## 2022-05-16 NOTE — ED Provider Notes (Signed)
[facility]    CSN: [id number] Arrival date & time: 05/16/22  1324      History   Chief Complaint Chief Complaint  Patient presents with   Rash    HPI Judy Miller is a 26 y.o. female.    Rash   Patient presents for rash that started approximately 1 week ago.  Patient states rash started on her right side/flank area and spread to her lower back, both of her legs, between her breasts, and along her right neck and jaw.  She states the rash is itchy.  She denies fever, chills, drainage, chest pain, shortness of breath, or abdominal pain.  Patient denies use of new soaps, detergents, foods, lotions, perfumes, or medications.  She has been using Benadryl, tea tree oil, and Benadryl cream without relief.  Patient states she was helping her father do some clearing prior to the onset of her symptoms.  She states she was helping him about 2 to 3 days before her symptoms started.  Past Medical History:  Diagnosis Date   Acne vulgaris 05/12/2015   ADHD (attention deficit hyperactivity disorder)    Anxiety    Arthritis    history of this in her knees   Depression    Depression, major, single episode, mild ([place]) 04/23/2013   Generalized anxiety disorder 11/06/2013   Genital herpes 11/2017   pos HSV 2 culture   Gestational hypertension, third trimester 06/13/2021   Renal stone 01/21/2019   Rhinitis, allergic 11/10/2015   Vaccine for human papilloma virus (HPV) types 6, 11, 16, and 18 administered     Patient Active Problem List   Diagnosis Date Noted   Bilateral ankle joint pain 02/24/2022   Bilateral wrist pain 02/24/2022   Cough 08/10/2021   Ear pain, bilateral 08/10/2021   Postpartum anxiety 07/27/2021   S/P primary low transverse C-section 06/16/2021   Tachycardia 06/15/2021   Fall due to stumbling 03/20/2021   History of nephrolithiasis 03/07/2021   History of herpes genitalis 12/13/2020   Anxiety and depression 05/12/2015   Generalized anxiety  disorder 11/06/2013   ADHD (attention deficit hyperactivity disorder)     Past Surgical History:  Procedure Laterality Date   CESAREAN SECTION N/A 06/16/2021   Procedure: CESAREAN SECTION;  Surgeon: Caren Macadam, MD;  Location: [facility];  Service: Obstetrics;  Laterality: N/A;    OB History     Gravida  1   Para  1   Term  1   Preterm  0   AB  0   Living  1      SAB  0   IAB  0   Ectopic  0   Multiple  0   Live Births  1            Home Medications    Prior to Admission medications   Medication Sig Start Date End Date Taking? Authorizing Provider  cetirizine (ZYRTEC) 10 MG tablet Take 1 tablet (10 mg total) by mouth daily. 05/16/22  Yes Yanci Bachtell-Warren, Alda Lea, NP  famotidine (PEPCID) 20 MG tablet Take 1 tablet (20 mg total) by mouth daily. 05/16/22  Yes Juwon Scripter-Warren, Alda Lea, NP  predniSONE (STERAPRED UNI-PAK 21 TAB) 10 MG (21) TBPK tablet Take by mouth daily. Take 6 tablets with breakfast on day 1. Take 5 tablets with breakfast on day 2. Take 4 tablets with breakfast on day 3. Take 3 tablets with breakfast on day 4. Take 2 tablets with breakfast on day 5.  Take 1 tablet with breakfast on day 6. 05/16/22  Yes Javani Spratt-Warren, Alda Lea, NP  triamcinolone cream (KENALOG) 0.1 % Apply 1 application  topically 2 (two) times daily. 05/16/22  Yes Eliane Hammersmith-Warren, Alda Lea, NP  hydrOXYzine (ATARAX/VISTARIL) 25 MG tablet Take 1 tablet (25 mg total) by mouth 4 (four) times daily as needed for anxiety. 07/04/21   Anyanwu, Sallyanne Havers, MD  ibuprofen (ADVIL) 600 MG tablet Take 1 tablet (600 mg total) by mouth every 6 (six) hours as needed for mild pain or moderate pain. 06/18/21   Seabron Spates, CNM  norelgestromin-ethinyl estradiol Marilu Favre) 150-35 MCG/24HR transdermal patch Place 1 patch onto the skin once a week. 09/07/21   Caren Macadam, MD  ondansetron (ZOFRAN ODT) 4 MG disintegrating tablet Take 1 tablet (4 mg total) by mouth every 8 (eight) hours as needed  for nausea or vomiting. 09/01/21   Vanessa Touchet, MD  Prenatal Vit-Fe Fumarate-FA (MULTIVITAMIN-PRENATAL) 27-0.8 MG TABS tablet Take 1 tablet by mouth daily at 12 noon.    [provider]  sertraline (ZOLOFT) 100 MG tablet Take 1 tablet (100 mg total) by mouth daily. for anxiety 02/24/22   Pleas Koch, NP  valACYclovir (VALTREX) 1000 MG tablet Take 1 tablet (1,000 mg total) by mouth daily. 05/16/21   Osborne Oman, MD    Family History Family History  Problem Relation Age of Onset   Arthritis Mother    Varicose Veins Maternal Grandmother    Heart disease Paternal Grandmother    Varicose Veins Paternal Grandmother     Social History Social History   Tobacco Use   Smoking status: Every Day    Types: E-cigarettes   Smokeless tobacco: Never  Vaping Use   Vaping Use: Every day   Substances: Nicotine  Substance Use Topics   Alcohol use: Yes    Alcohol/week: 0.0 standard drinks of alcohol    Comment: barely   Drug use: No    Comment: history of      Allergies   Cod [fish allergy], Fish-derived products, Other, and Shellfish allergy   Review of Systems Review of Systems  Skin:  Positive for rash.   Per HPI  Physical Exam Triage Vital Signs ED Triage Vitals  Enc Vitals Group     BP 05/16/22 1335 129/86     Pulse Rate 05/16/22 1335 87     Resp 05/16/22 1335 20     Temp 05/16/22 1335 98.2 F (36.8 C)     Temp Source 05/16/22 1335 Oral     SpO2 05/16/22 1335 99 %     Weight --      Height --      Head Circumference --      Peak Flow --      Pain Score 05/16/22 1333 0     Pain Loc --      Pain Edu? --      Excl. in [place]? --    No data found.  Updated Vital Signs BP 129/86 (BP Location: Right Arm)   Pulse 87   Temp 98.2 F (36.8 C) (Oral)   Resp 20   LMP 05/01/2022 (Approximate)   SpO2 99%   Breastfeeding No   Visual Acuity Right Eye Distance:   Left Eye Distance:   Bilateral Distance:    Right Eye Near:   Left Eye Near:    Bilateral  Near:     Physical Exam Vitals and nursing note reviewed.  Constitutional:  Appearance: Normal appearance.  Eyes:     Pupils: Pupils are equal, round, and reactive to light.  Cardiovascular:     Rate and Rhythm: Normal rate.  Pulmonary:     Effort: Pulmonary effort is normal.  Musculoskeletal:     Cervical back: Normal range of motion.  Skin:    General: Skin is warm and dry.     Findings: Erythema and rash present. Rash is macular, papular and urticarial.     Comments: Rash noted to right flank extending into the right back and lower spine.  Area on right flank appears as a large patch. Maculopapular rash present on the chest, between the breasts, lateral upper thighs, and right neck/jaw.  Neurological:     General: No focal deficit present.     Mental Status: She is alert and oriented to person, place, and time.  Psychiatric:        Mood and Affect: Mood normal.        Behavior: Behavior normal.        Thought Content: Thought content normal.        Judgment: Judgment normal.      UC Treatments / Results  Labs (all labs ordered are listed, but only abnormal results are displayed) Labs Reviewed - No data to display  EKG   Radiology No results found.  Procedures Procedures (including critical care time)  Medications Ordered in UC Medications  dexamethasone (DECADRON) injection 10 mg (10 mg Intramuscular Given 05/16/22 1358)    Initial Impression / Assessment and Plan / UC Course  I have reviewed the triage vital signs and the nursing notes.  Pertinent labs & imaging results that were available during my care of the patient were reviewed by me and considered in my medical decision making (see chart for details).  Patient presents with rash has been present for the past week.  Rash is maculopapular with a large patch to the right flank/lower back.  No congruent pattern noted.  Cannot identify trigger or cause of rash at this time.  Decadron 10 mg IM injection  given in the clinic.  Patient started on prednisone taper for her symptoms along with Zyrtec, famotidine, and triamcinolone cream.  Supportive care recommendations were provided to the patient.  Patient advised to follow-up if symptoms do not improve. Final Clinical Impressions(s) / UC Diagnoses   Final diagnoses:  Rash and nonspecific skin eruption     Discharge Instructions      Take medication as prescribed.  You were given an intramuscular injection of Decadron 10 mg today.  You can start the prednisone tomorrow. Avoid hot baths or showers while symptoms persist.  May use lukewarm water with bathing and showering. Purchase over-the-counter Aveeno colloidal oatmeal bath to help with itching and drying. Take Benadryl at bedtime as needed for itching. Avoid scratching, rubbing, or manipulating the area while symptoms persist. Follow-up if symptoms do not improve.     ED Prescriptions     Medication Sig Dispense Auth. Provider   famotidine (PEPCID) 20 MG tablet Take 1 tablet (20 mg total) by mouth daily. 30 tablet Abbagayle Zaragoza-Warren, Alda Lea, NP   cetirizine (ZYRTEC) 10 MG tablet Take 1 tablet (10 mg total) by mouth daily. 30 tablet Cristo Ausburn-Warren, Alda Lea, NP   predniSONE (STERAPRED UNI-PAK 21 TAB) 10 MG (21) TBPK tablet Take by mouth daily. Take 6 tablets with breakfast on day 1. Take 5 tablets with breakfast on day 2. Take 4 tablets with breakfast on day 3. Take  3 tablets with breakfast on day 4. Take 2 tablets with breakfast on day 5. Take 1 tablet with breakfast on day 6. 21 tablet Josalyn Dettmann-Warren, Alda Lea, NP   triamcinolone cream (KENALOG) 0.1 % Apply 1 application  topically 2 (two) times daily. 45 g Quintana Canelo-Warren, Alda Lea, NP      PDMP not reviewed this encounter.   Tish Men, NP 05/16/22 1415

## 2022-05-16 NOTE — Discharge Instructions (Addendum)
Take medication as prescribed.  You were given an intramuscular injection of Decadron 10 mg today.  You can start the prednisone tomorrow. Avoid hot baths or showers while symptoms persist.  May use lukewarm water with bathing and showering. Purchase over-the-counter Aveeno colloidal oatmeal bath to help with itching and drying. Take Benadryl at bedtime as needed for itching. Avoid scratching, rubbing, or manipulating the area while symptoms persist. Follow-up if symptoms do not improve.

## 2022-05-16 NOTE — ED Triage Notes (Signed)
Pt states she has a rash on her right side for a week  Pt states that the rash is now spreading down both of her legs, between breast, inner thighs , right jaw line and lower spine  Pt states she has tried Benadryl, tea tree oil and Benadryl cream without any relief  Pt states she has not changed any foods, soaps, lotions, perfumes or meds

## 2022-08-30 IMAGING — US US FETAL BPP W/ NON-STRESS
1 series · 14 of 14 positions shown · non-contrast
Comparison: none

[Series 1: us fetal bpp w/ non-stress · 14 acquisitions, 14 frames shown]
[im 1/14]
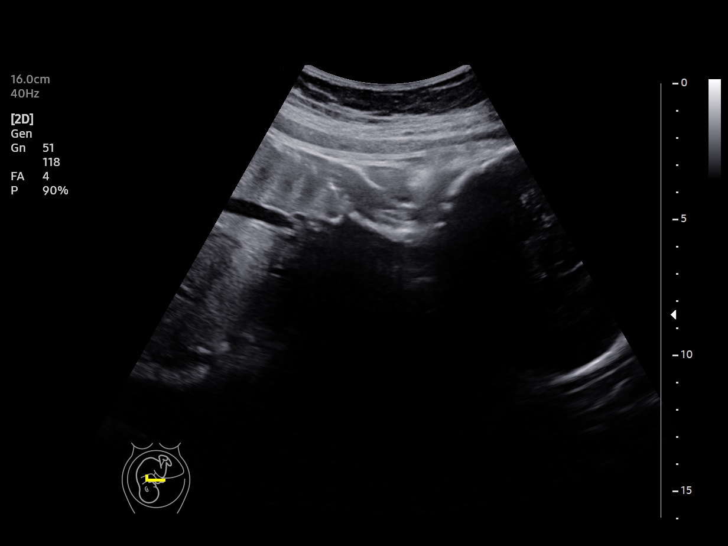
[im 2/14]
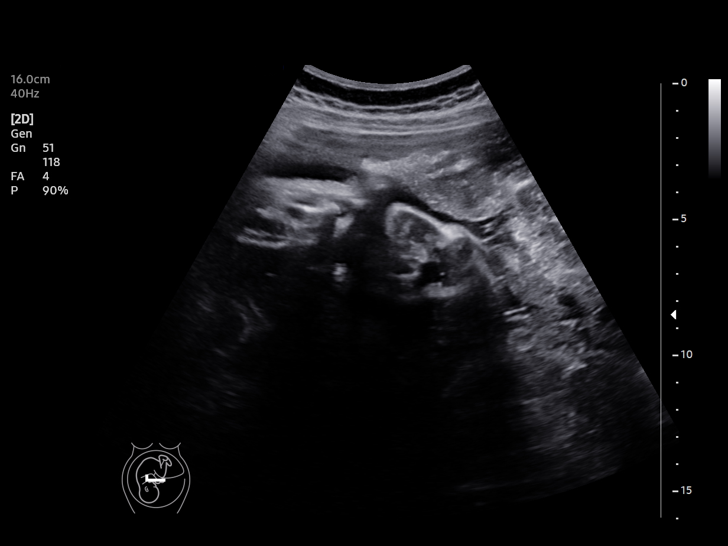
[im 3/14]
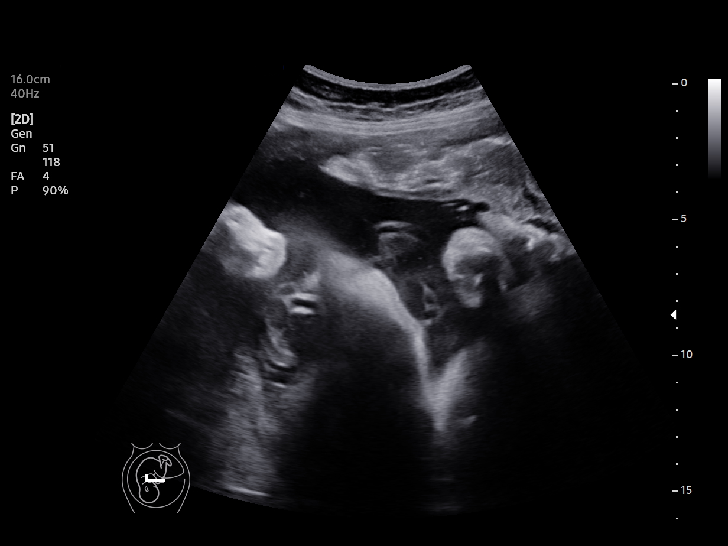
[im 4/14]
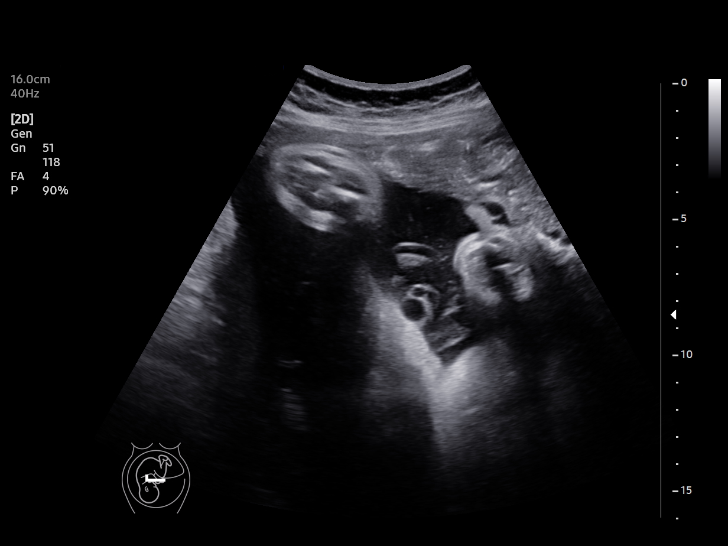
[im 5/14]
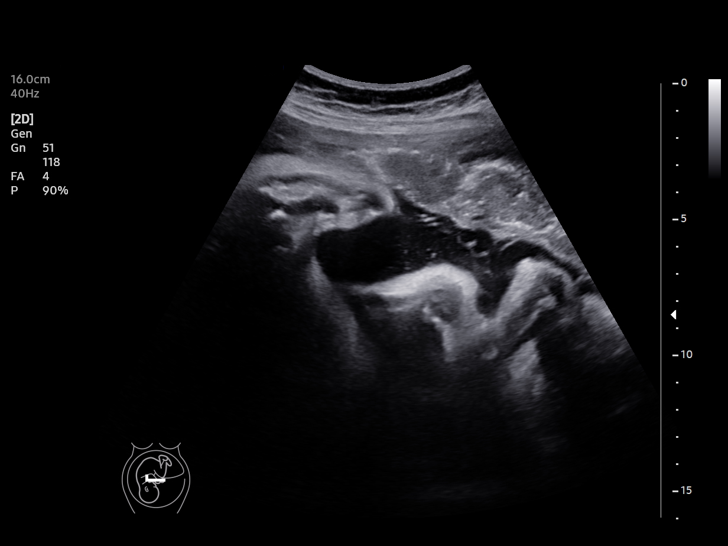
[im 6/14]
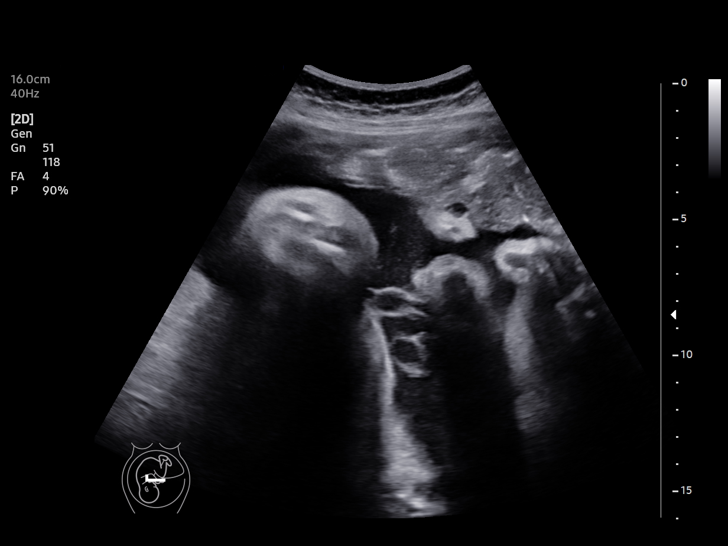
[im 7/14]
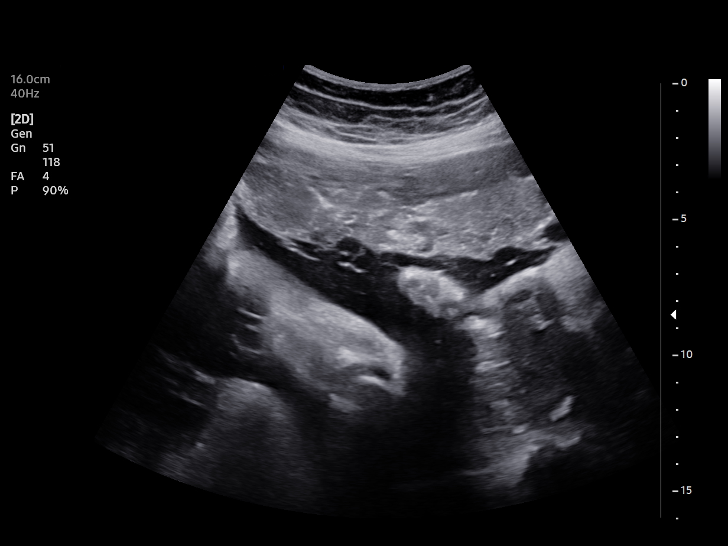
[im 8/14]
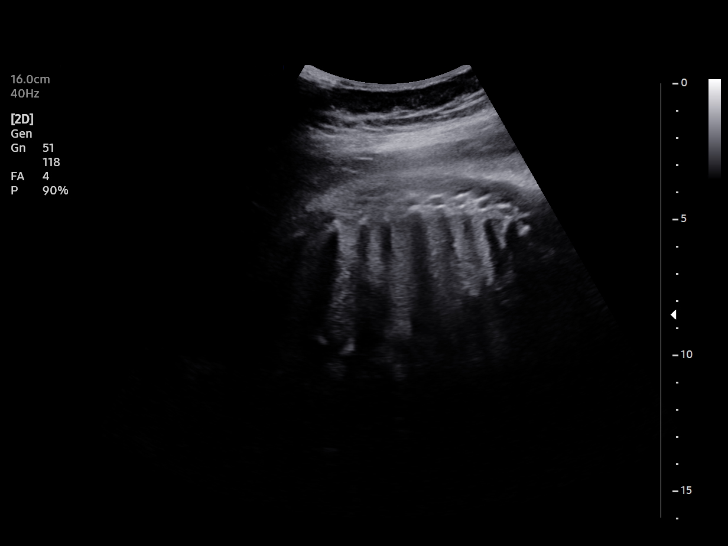
[im 9/14]
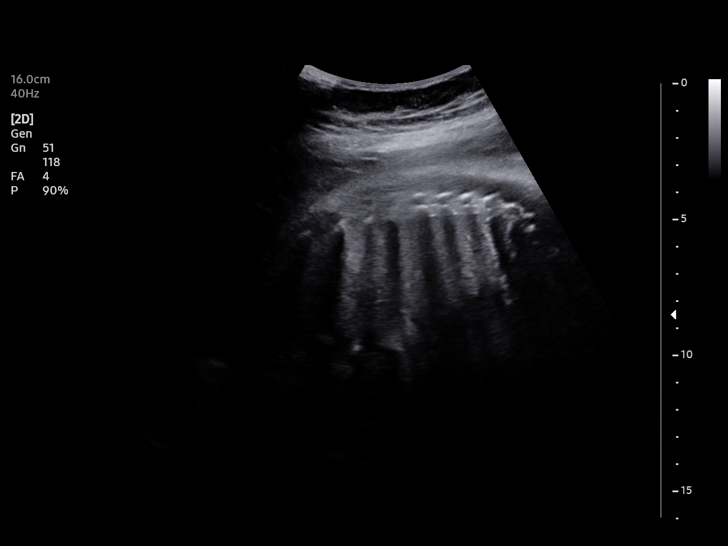
[im 10/14]
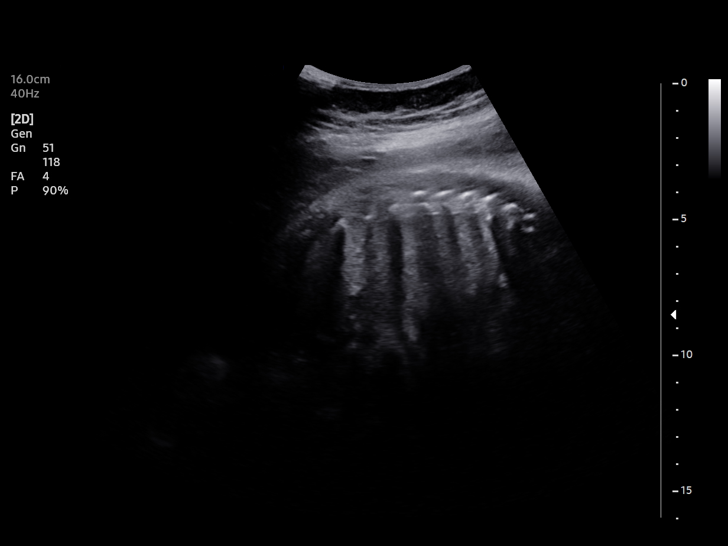
[im 11/14]
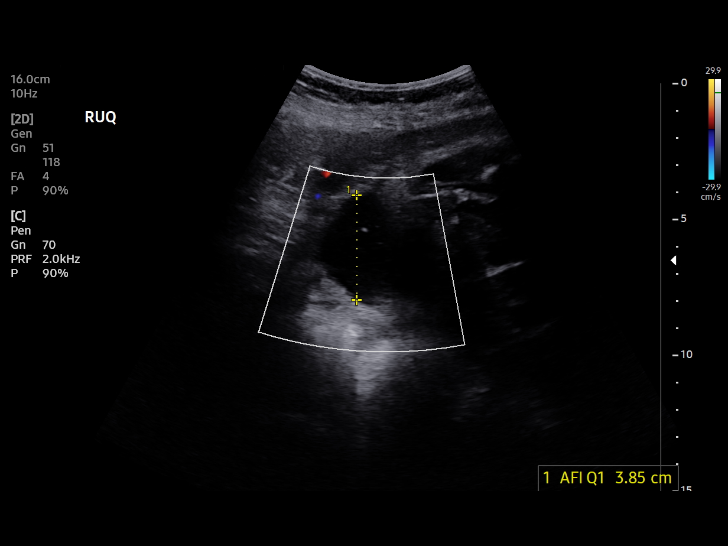
[im 12/14]
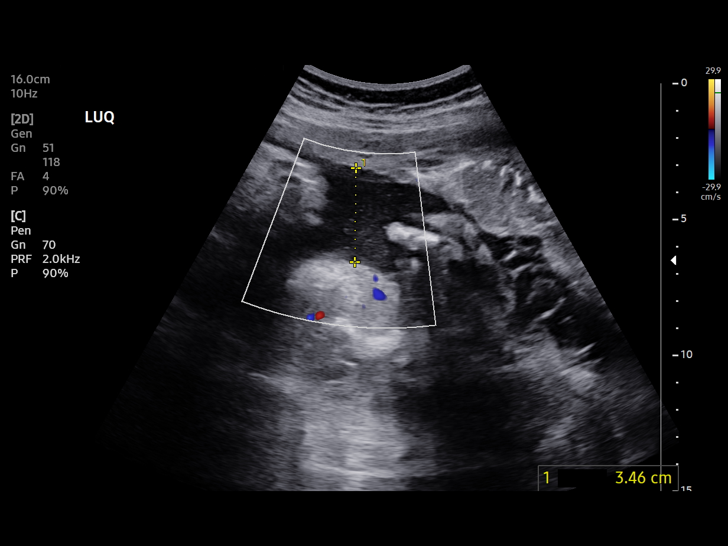
[im 13/14]
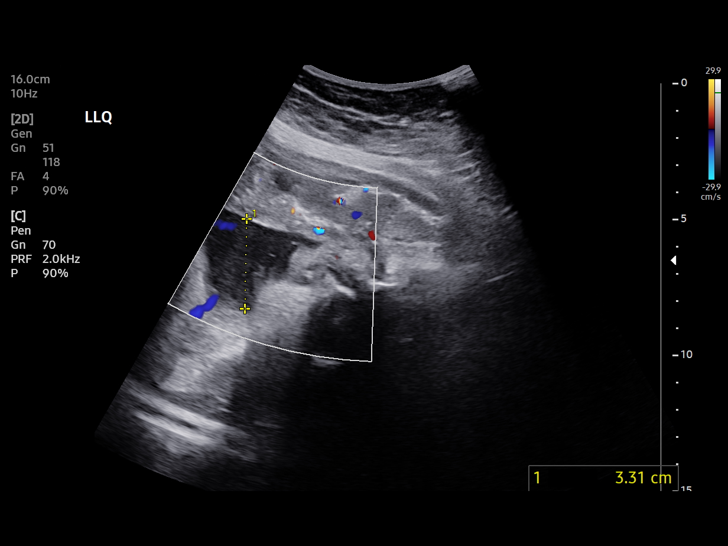
[im 14/14]
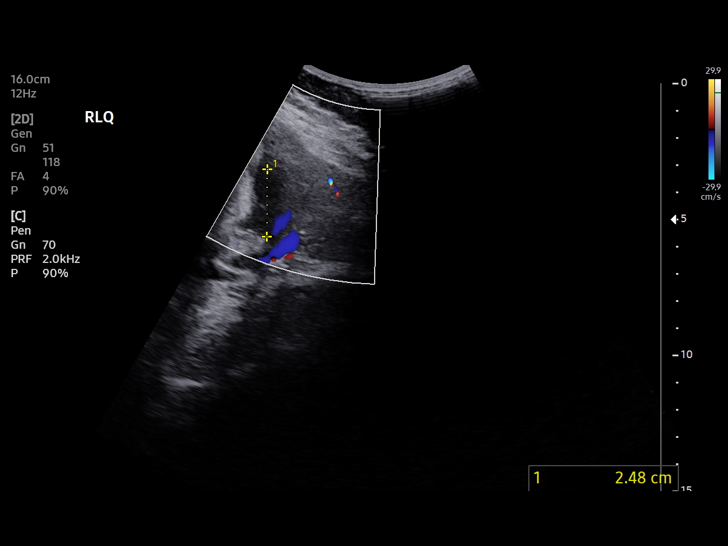

[14 of 14 positions shown; findings below may reference images not displayed]

[REDACTED]care at

 1  US FETAL BPP W/NONSTRESS              76818.4     NEKOL REUBEN

Indications

 38 weeks gestation of pregnancy
 Gestational hypertension without significant
 proteinuria, third trimester
Fetal Evaluation

 Num Of Fetuses:         1
 Preg. Location:         Intrauterine
 Cardiac Activity:       Observed
 Fetal Lie:              Maternal right side
 Presentation:           Cephalic

 Amniotic Fluid
 AFI FV:      Within normal limits

 AFI Sum(cm)     %Tile       Largest Pocket(cm)
 13.1            50

 RUQ(cm)       RLQ(cm)       LUQ(cm)        LLQ(cm)

Biophysical Evaluation

 Amniotic F.V:   Pocket => 2 cm             F. Tone:        Observed
 F. Movement:    Observed                   Score:          [DATE]
 F. Breathing:   Observed
Gestational Age

 LMP:           37w 1d        Date:  09/27/20                 EDD:   07/04/21
 Clinical EDD:  38w 1d                                        EDD:   06/27/21
 Best:          38w 1d     Det. By:  Clinical EDD             EDD:   06/27/21
Impression

 Reassuring antenatal testing
Recommendations

 Patient for induction later this week.
                Polania, Xavier Dario

## 2022-09-25 DIAGNOSIS — F411 Generalized anxiety disorder: Secondary | ICD-10-CM

## 2022-09-27 MED ORDER — SERTRALINE HCL 25 MG PO TABS: 25.0000 mg | ORAL_TABLET | Freq: Every day | ORAL | 0 refills | Status: DC

## 2022-11-17 IMAGING — CT CT RENAL STONE PROTOCOL
3 of 4 series · 7 of 46 positions shown, 13 images · non-contrast
Comparison: 01/15/2019

CLINICAL DATA: Flank pain.  Evaluate kidney stone.

EXAM:
CT ABDOMEN AND PELVIS WITHOUT CONTRAST
TECHNIQUE: Multidetector CT imaging of the abdomen and pelvis was performed
following the standard protocol without IV contrast.

[Series 4: lung bases · axial · 0.84mm/px · z∈[+792,+832]mm · 3 of 18 slices shown, 7 images]
[im 5/18  soft-tissue]
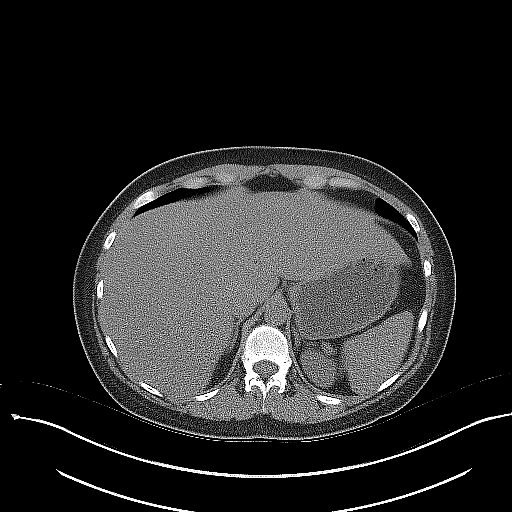
[im 5/18  lung]
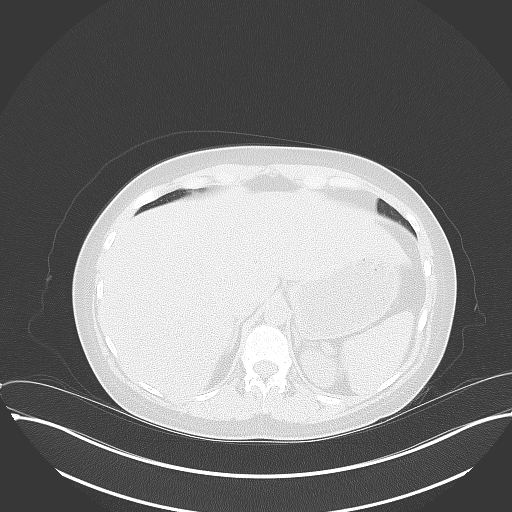
[im 5/18  bone]
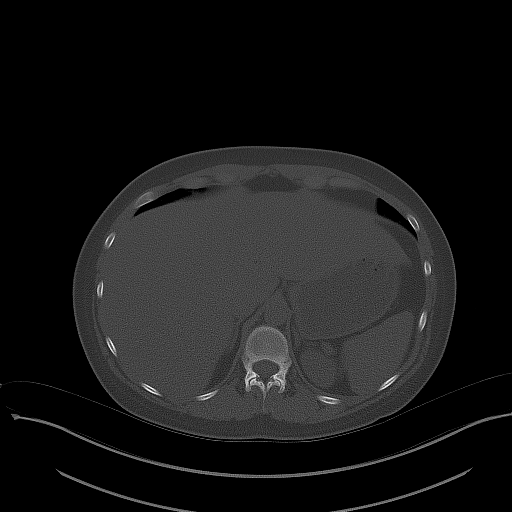
[im 9/18  soft-tissue]
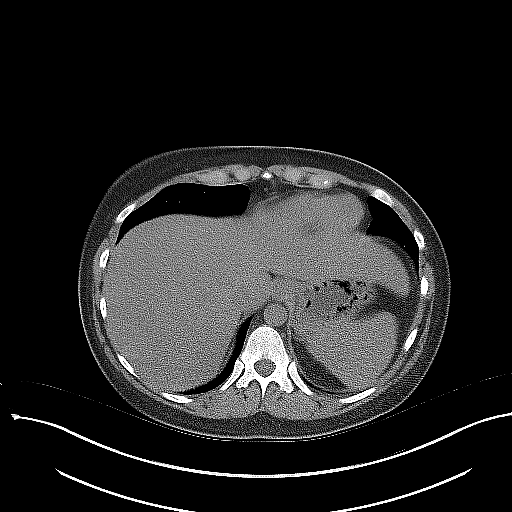
[im 9/18  lung]
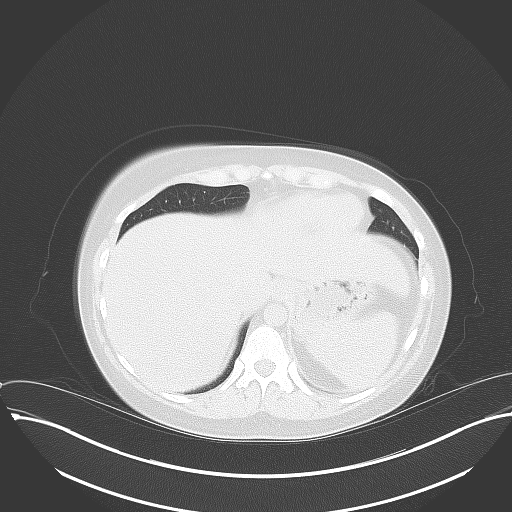
[im 13/18  soft-tissue]
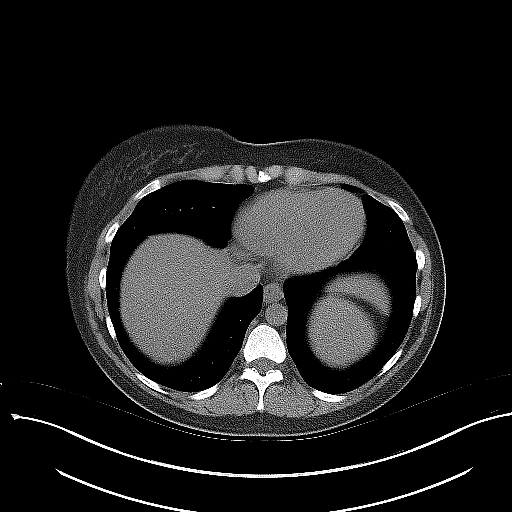
[im 13/18  lung]
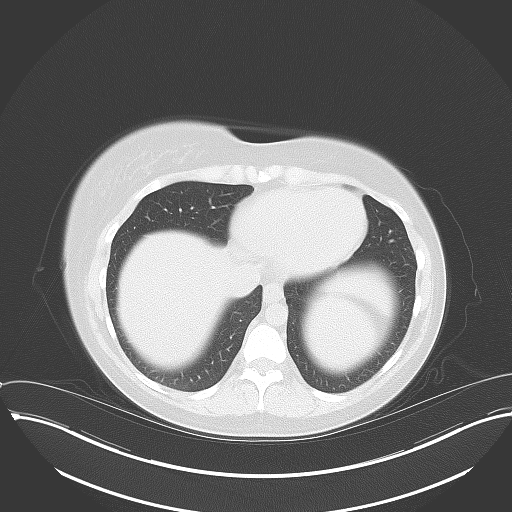

[Series 5: coronal · coronal · 0.83mm/px · 3 of 143 slices shown, 4 images]
[im 48/143  soft-tissue]
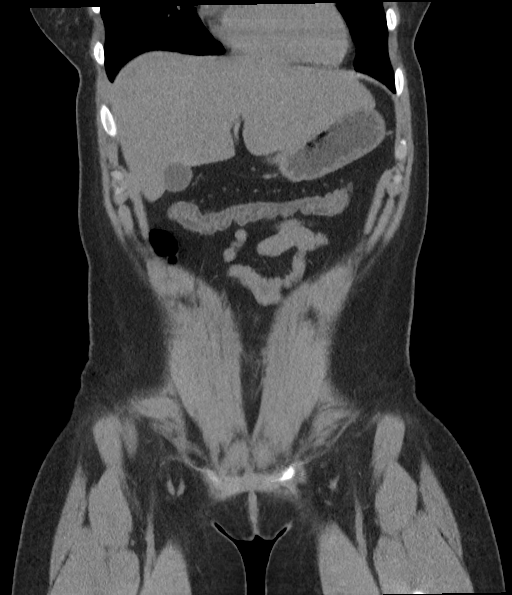
[im 64/143  soft-tissue]
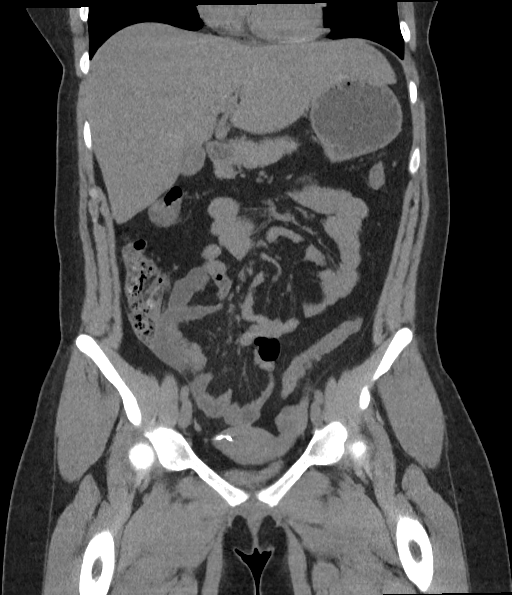
[im 64/143  bone]
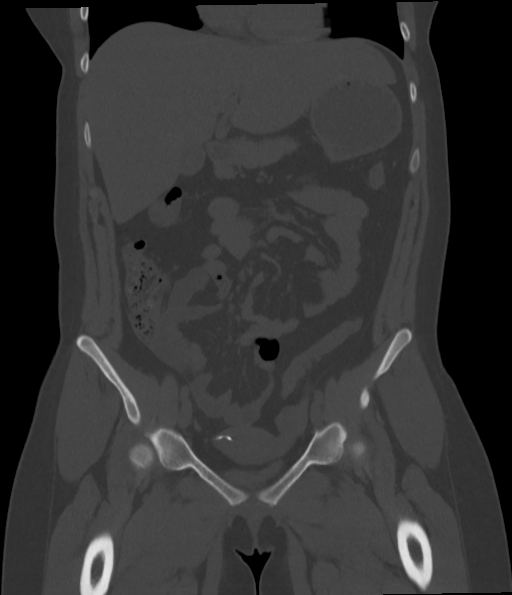
[im 79/143  soft-tissue]
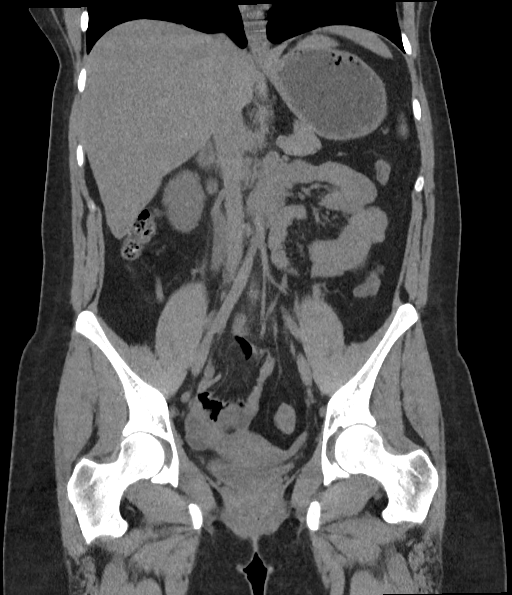

[Series 6: sagittal · sagittal · 0.56mm/px · 1 of 212 slices shown, 2 images]
[im 71/212  soft-tissue]
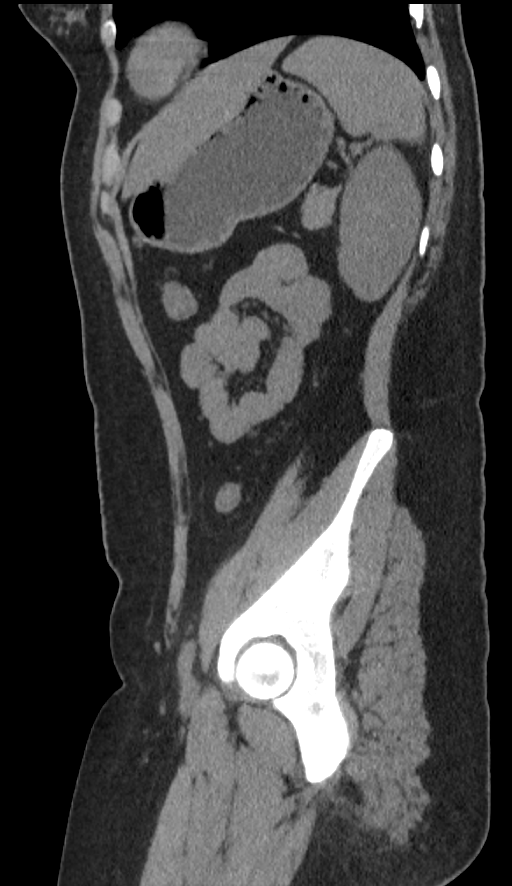
[im 71/212  bone]
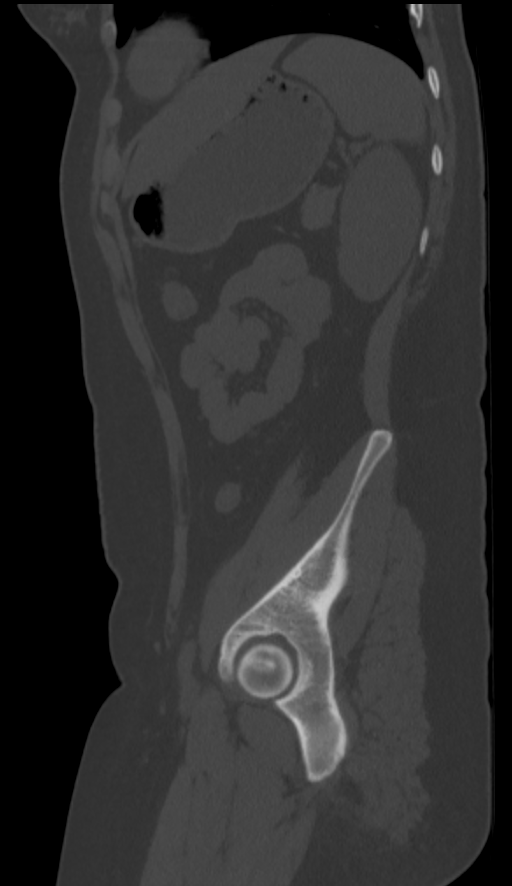

[7 of 46 positions shown; findings below may reference images not displayed]

FINDINGS: Lower chest: No acute abnormality.

Hepatobiliary: No focal liver abnormality is seen. No gallstones,
gallbladder wall thickening, or biliary dilatation.

Pancreas: Unremarkable. No pancreatic ductal dilatation or
surrounding inflammatory changes.

Spleen: Normal in size without focal abnormality.

Adrenals/Urinary Tract: Normal adrenal glands. Punctate stone noted
within the interpolar collecting system of the left kidney, image
109/5. No left-sided hydronephrosis or mass noted. Multiple small
stones scratch [DATE] small stones within the interpolar and
inferior pole right kidney collecting system which measure up to 2-3
mm. There is right-sided nephromegaly, hydronephrosis and
hydroureter. Stone at the right UVJ is identified measuring 5 mm,
image 78/2. Urinary bladder appears collapsed.

Stomach/Bowel: Stomach is within normal limits. Appendix appears
normal. No evidence of bowel wall thickening, distention, or
inflammatory changes.

Vascular/Lymphatic: No significant vascular findings are present. No
enlarged abdominal or pelvic lymph nodes.

Reproductive: There is an IUD in-situ. The IUD appears malpositioned
with the left arm extending through the myometrium into the
peritoneal cavity, image 72/2. The right arm appears to be within
the right cornua region near the fundus. No adnexal mass identified

Other: No abdominal wall hernia or abnormality. No abdominopelvic
ascites.

Musculoskeletal: No acute or significant osseous findings.
IMPRESSION: 1. Right-sided hydronephrosis and hydroureter secondary to 5 mm
right UVJ calculus.
2. Bilateral nephrolithiasis.
3. Malpositioned IUD with the left arm extending through the
myometrium into the peritoneal cavity. The right arm appears to be
within the in the right cornua region.

## 2022-12-04 NOTE — L&D Delivery Note (Signed)
OB/GYN Faculty Practice Delivery Note  Judy Miller is a 27 y.o. [id number] s/p VBAC at [redacted]w[redacted]d. She was admitted for PDIOL.   ROM: 10h 25m with clear fluid GBS Status:  Negative/-- (08/26 1623) Maximum Maternal Temperature: 98.7  Labor Progress: Initial SVE: 1/50/-3. Augmented with pitocin and AROM. She then progressed to complete @2050 .   Delivery Date/Time: 09/04/2023 @2130  Delivery: Called to room and patient was complete and pushing. Head delivered ROA. Loose nuchal cord present, delivered through. Shoulder and body delivered in usual fashion. Infant with spontaneous cry, placed on mother's abdomen, dried and stimulated. Cord clamped x 2 after 1-minute delay, and cut by FOB. Cord blood drawn. Placenta delivered spontaneously with gentle cord traction. Fundus firm with massage and Pitocin. Labia, perineum, vagina, and cervix inspected with 1st degree perineal laceration repaired with 3.0 vicryl.   Baby Weight: 3630g  Placenta: 3 vessel, intact. Sent to L&D Complications: None Lacerations: 1st degree perineal EBL: 250 mL Analgesia: Epidural   Infant:  APGAR (1 MIN): 7  APGAR (5 MINS): 9   Wyn Forster, MD OB Family Medicine Fellow, [facility] for [facility], [facility] 09/04/2023, 11:47 PM

## 2023-02-22 ENCOUNTER — Encounter: Payer: Self-pay | Admitting: Advanced Practice Midwife

## 2023-02-22 ENCOUNTER — Other Ambulatory Visit (INDEPENDENT_AMBULATORY_CARE_PROVIDER_SITE_OTHER): Payer: Medicaid Other

## 2023-02-22 ENCOUNTER — Ambulatory Visit (INDEPENDENT_AMBULATORY_CARE_PROVIDER_SITE_OTHER): Payer: Medicaid Other | Admitting: Advanced Practice Midwife

## 2023-02-22 ENCOUNTER — Other Ambulatory Visit (HOSPITAL_COMMUNITY)
Admission: RE | Admit: 2023-02-22 | Discharge: 2023-02-22 | Disposition: A | Discharge status: Home / Self Care | Payer: Medicaid Other | Source: Ambulatory Visit | Attending: Advanced Practice Midwife | Admitting: Advanced Practice Midwife

## 2023-02-22 VITALS — BP 112/80 | HR 85 | Wt 173.0 lb

## 2023-02-22 DIAGNOSIS — O0990 Supervision of high risk pregnancy, unspecified, unspecified trimester: Secondary | ICD-10-CM | POA: Insufficient documentation

## 2023-02-22 DIAGNOSIS — O34211 Maternal care for low transverse scar from previous cesarean delivery: Secondary | ICD-10-CM

## 2023-02-22 DIAGNOSIS — Z8619 Personal history of other infectious and parasitic diseases: Secondary | ICD-10-CM | POA: Diagnosis not present

## 2023-02-22 DIAGNOSIS — Z98891 History of uterine scar from previous surgery: Secondary | ICD-10-CM

## 2023-02-22 DIAGNOSIS — Z3A13 13 weeks gestation of pregnancy: Secondary | ICD-10-CM | POA: Diagnosis not present

## 2023-02-22 DIAGNOSIS — Z3481 Encounter for supervision of other normal pregnancy, first trimester: Secondary | ICD-10-CM

## 2023-02-22 DIAGNOSIS — Z3A11 11 weeks gestation of pregnancy: Secondary | ICD-10-CM

## 2023-02-22 DIAGNOSIS — O0991 Supervision of high risk pregnancy, unspecified, first trimester: Secondary | ICD-10-CM | POA: Diagnosis not present

## 2023-02-22 DIAGNOSIS — Z8759 Personal history of other complications of pregnancy, childbirth and the puerperium: Secondary | ICD-10-CM

## 2023-02-22 IMAGING — CR DG CHEST 2V
1 series · 2 of 2 positions shown · non-contrast
Comparison: 10/06/2020

CLINICAL DATA: Chest pain

EXAM:
CHEST - 2 VIEW

[Series 1: dg chest 2 view · 0.14mm/px · 2 of 2 slices shown]
[im 1/2]
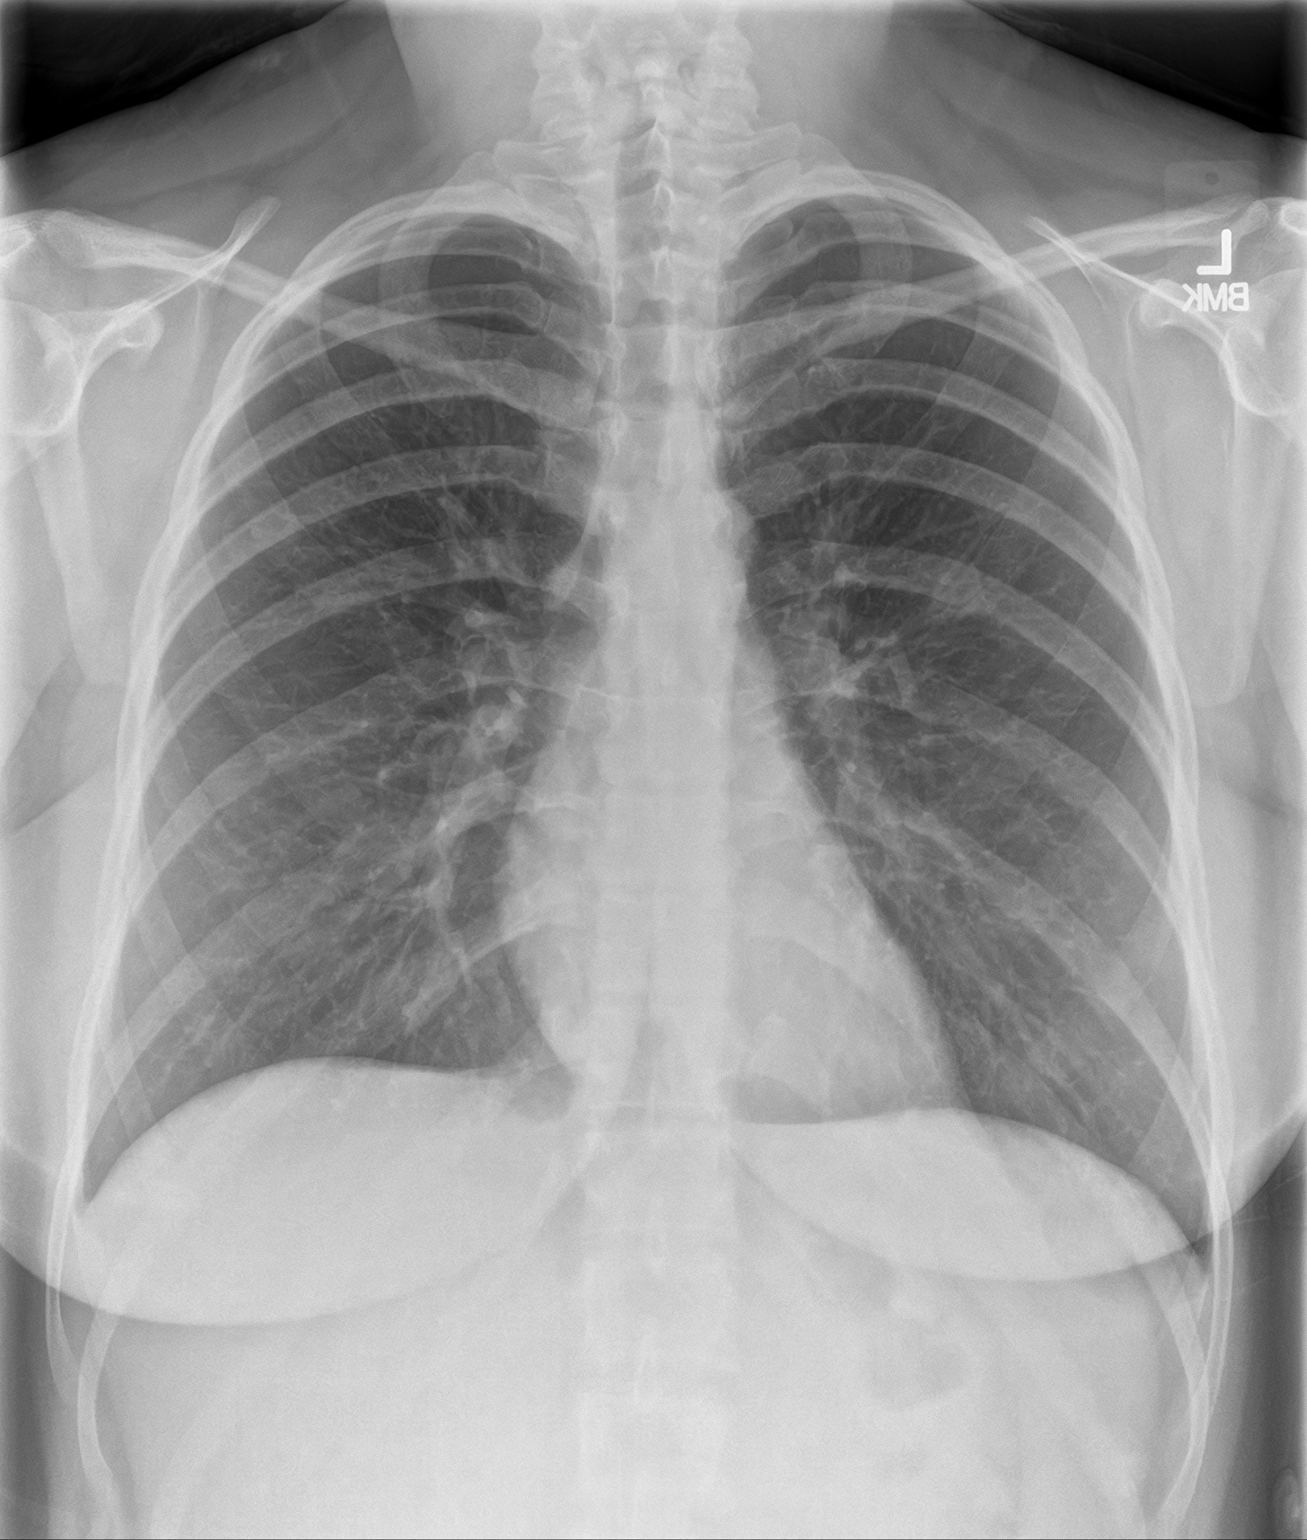
[im 2/2]
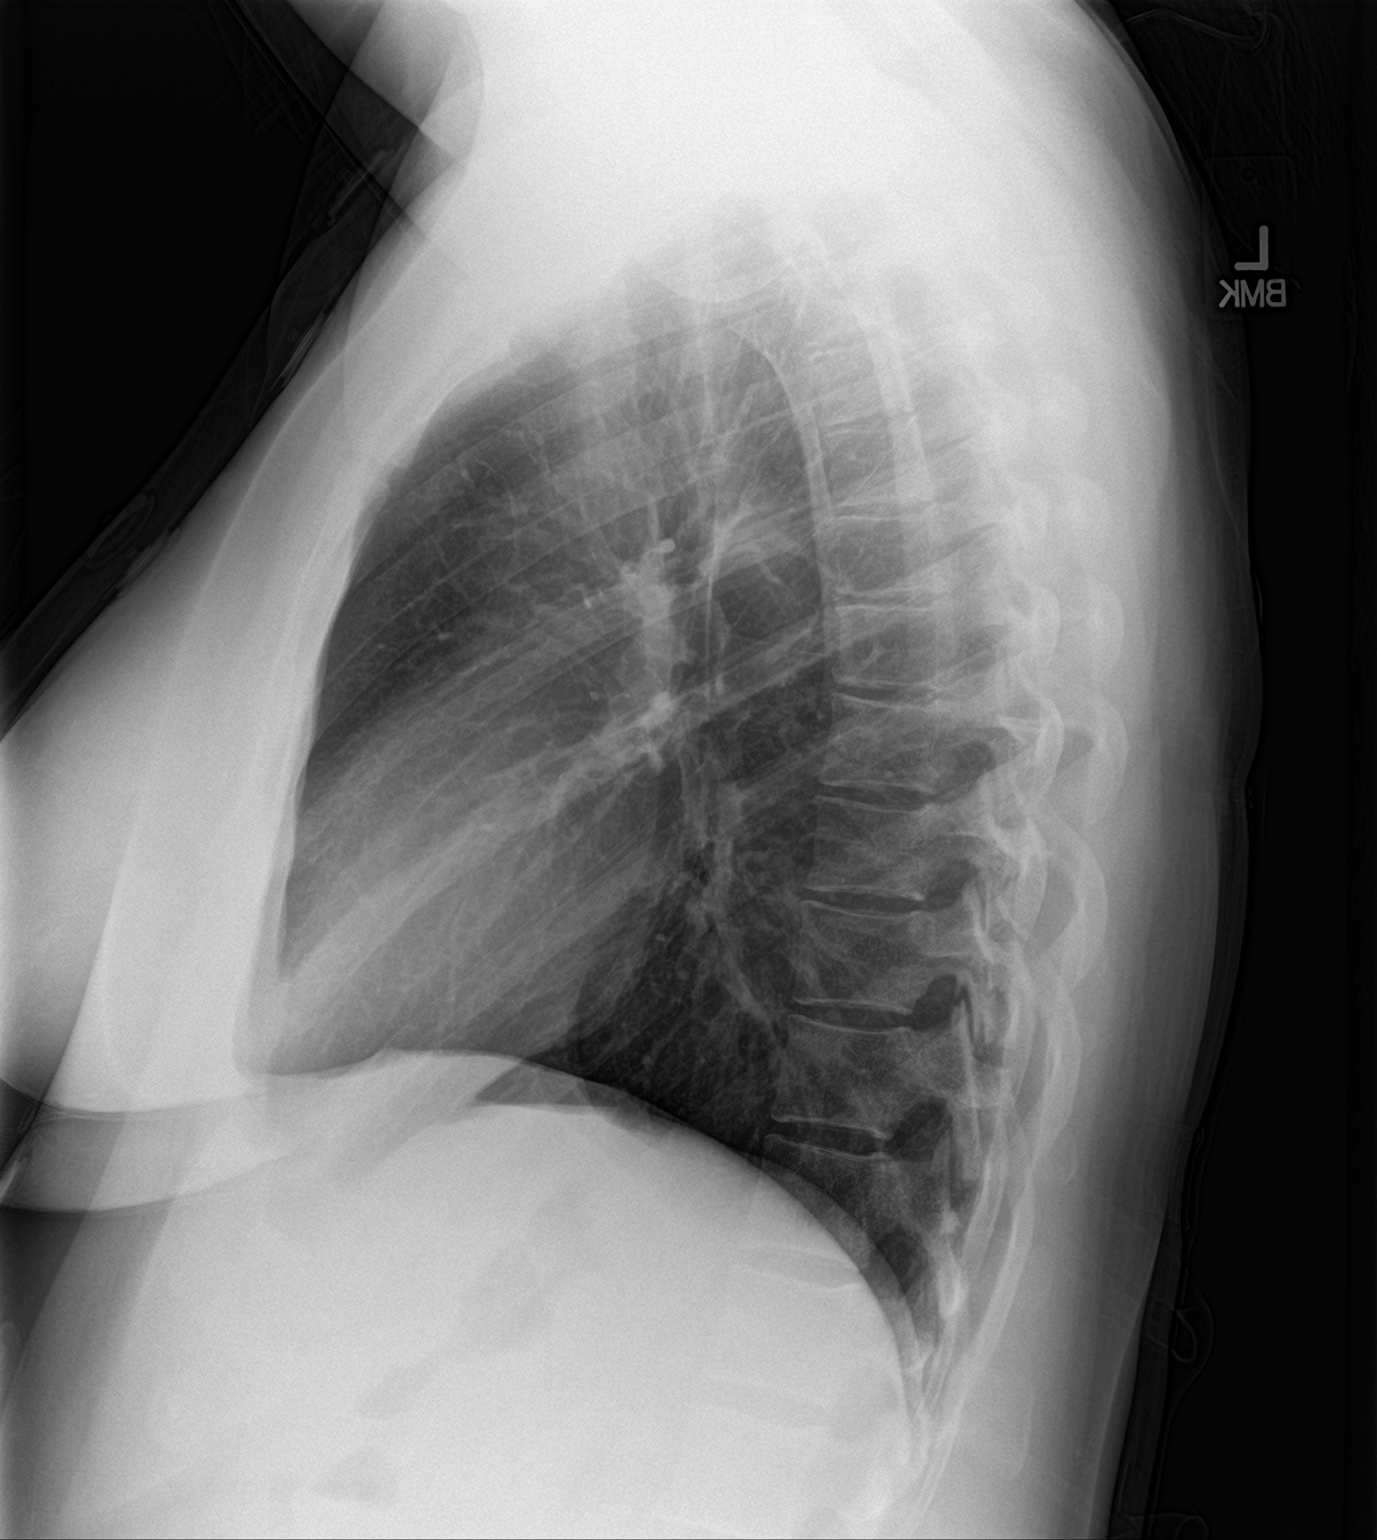

[2 of 2 positions shown; findings below may reference images not displayed]

FINDINGS: The heart size and mediastinal contours are within normal limits.
Both lungs are clear. The visualized skeletal structures are
unremarkable.
IMPRESSION: No active cardiopulmonary disease.

## 2023-02-22 MED ORDER — ASPIRIN 81 MG PO CHEW: 81.0000 mg | CHEWABLE_TABLET | Freq: Every day | ORAL | Status: DC

## 2023-02-22 NOTE — Progress Notes (Signed)
Patient informed that the ultrasound is considered a limited obstetric ultrasound and is not intended to be a complete ultrasound exam.  Patient also informed that the ultrasound is not being completed with the intent of assessing for fetal or placental anomalies or any pelvic abnormalities. Explained that the purpose of today's ultrasound is to assess for fetal heart rate.  Patient acknowledges the purpose of the exam and the limitations of the study.         

## 2023-02-22 NOTE — Patient Instructions (Signed)

## 2023-02-23 LAB — CBC/D/PLT+RPR+RH+ABO+RUBIGG...
Antibody Screen: NEGATIVE
Basophils Absolute: 0 10*3/uL (ref 0.0–0.2)
Basos: 0 %
EOS (ABSOLUTE): 0.1 10*3/uL (ref 0.0–0.4)
Eos: 1 %
HCV Ab: NONREACTIVE
HIV Screen 4th Generation wRfx: NONREACTIVE
Hematocrit: 42.4 % (ref 34.0–46.6)
Hemoglobin: 13.9 g/dL (ref 11.1–15.9)
Hepatitis B Surface Ag: NEGATIVE
Immature Grans (Abs): 0 10*3/uL (ref 0.0–0.1)
Immature Granulocytes: 0 %
Lymphocytes Absolute: 2.6 10*3/uL (ref 0.7–3.1)
Lymphs: 31 %
MCH: 27.5 pg (ref 26.6–33.0)
MCHC: 32.8 g/dL (ref 31.5–35.7)
MCV: 84 fL (ref 79–97)
Monocytes Absolute: 0.4 10*3/uL (ref 0.1–0.9)
Monocytes: 4 %
Neutrophils Absolute: 5.2 10*3/uL (ref 1.4–7.0)
Neutrophils: 64 %
Platelets: 249 10*3/uL (ref 150–450)
RBC: 5.06 x10E6/uL (ref 3.77–5.28)
RDW: 12.9 % (ref 11.7–15.4)
RPR Ser Ql: NONREACTIVE
Rh Factor: POSITIVE
Rubella Antibodies, IGG: 3.68 index (ref >0.99)
WBC: 8.2 10*3/uL (ref 3.4–10.8)

## 2023-02-23 LAB — COMPREHENSIVE METABOLIC PANEL
ALT: 13 IU/L (ref 0–32)
AST: 16 IU/L (ref 0–40)
Albumin/Globulin Ratio: 1.3 (ref 1.2–2.2)
Albumin: 4 g/dL (ref 4.0–5.0)
Alkaline Phosphatase: 73 IU/L (ref 44–121)
BUN/Creatinine Ratio: 14 (ref 9–23)
BUN: 6 mg/dL (ref 6–20)
Bilirubin Total: <0.2 mg/dL (ref 0.0–1.2)
CO2: 22 mmol/L (ref 20–29)
Calcium: 9.3 mg/dL (ref 8.7–10.2)
Chloride: 102 mmol/L (ref 96–106)
Creatinine, Ser: 0.44 mg/dL — ABNORMAL LOW (ref 0.57–1.00)
Globulin, Total: 3 g/dL (ref 1.5–4.5)
Glucose: 67 mg/dL — ABNORMAL LOW (ref 70–99)
Potassium: 4 mmol/L (ref 3.5–5.2)
Sodium: 140 mmol/L (ref 134–144)
Total Protein: 7 g/dL (ref 6.0–8.5)
eGFR: 138 mL/min/{1.73_m2} (ref >59)

## 2023-02-23 LAB — HCV INTERPRETATION

## 2023-02-24 LAB — URINE CULTURE, OB REFLEX

## 2023-02-24 LAB — CULTURE, OB URINE

## 2023-02-24 NOTE — Progress Notes (Signed)
History:   Judy Miller is a 27 y.o. G2P1001 at [redacted]w[redacted]d by LMP c/w early ultrasound being seen today for her first obstetrical visit.  Her obstetrical history is significant for GHTN, primary cesarean for active HSV lesion. Patient does intend to breast feed. Pregnancy history fully reviewed.  Patient reports no complaints. She is feeling a little overwhelmed by this unplanned but welcomed pregnancy. She and her partner are saving for a house and doing so by living with relatives.       HISTORY: OB History  Gravida Para Term Preterm AB Living  2 1 1  0 0 1  SAB IAB Ectopic Multiple Live Births  0 0 0 0 1    # Outcome Date GA Lbr Len/2nd Weight Sex Delivery Anes PTL Lv  2 Current           1 Term 06/16/21 [redacted]w[redacted]d  6 lb 12.3 oz (3.07 kg) F CS-LTranv Spinal  LIV     Name: RUKHSAR, HARMES     Apgar1: 9  Apgar5: 9    Last pap smear was done 12/13/2020 and was normal  Past Medical History:  Diagnosis Date   Acne vulgaris 05/12/2015   ADHD (attention deficit hyperactivity disorder)    Anxiety    Arthritis    history of this in her knees   Depression    Depression, major, single episode, mild ([place]) 04/23/2013   Generalized anxiety disorder 11/06/2013   Generalized anxiety disorder 11/06/2013   Genital herpes 11/2017   pos HSV 2 culture   Gestational hypertension, third trimester 06/13/2021   History of nephrolithiasis 03/07/2021   Renal stone 01/21/2019   Rhinitis, allergic 11/10/2015   Tachycardia 06/15/2021   Vaccine for human papilloma virus (HPV) types 6, 11, 16, and 18 administered    Past Surgical History:  Procedure Laterality Date   CESAREAN SECTION N/A 06/16/2021   Procedure: CESAREAN SECTION;  Surgeon: Caren Macadam, MD;  Location: [facility] LD [facility]S;  Service: Obstetrics;  Laterality: N/A;   Family History  Problem Relation Age of Onset   Arthritis Mother    Varicose Veins Maternal Grandmother    Heart disease Paternal Grandmother    Varicose Veins  Paternal Grandmother    Social History   Tobacco Use   Smoking status: Every Day    Types: E-cigarettes   Smokeless tobacco: Never  Vaping Use   Vaping Use: Every day   Substances: Nicotine  Substance Use Topics   Alcohol use: Yes    Alcohol/week: 0.0 standard drinks of alcohol    Comment: barely   Drug use: No    Comment: history of    Allergies  Allergen Reactions   Cod [Fish Allergy] Hives    Grouper fish   Fish-Derived Products Hives    Grouper fish   Other Hives and Other (See Comments)    Red hair dye Crab legs Grouper fish   Shellfish Allergy Other (See Comments)    Crab legs    Current Outpatient Medications on File Prior to Visit  Medication Sig Dispense Refill   Prenatal Vit-Fe Fumarate-FA (MULTIVITAMIN-PRENATAL) 27-0.8 MG TABS tablet Take 1 tablet by mouth daily at 12 noon.     valACYclovir (VALTREX) 1000 MG tablet Take 1 tablet (1,000 mg total) by mouth daily. 60 tablet 3   No current facility-administered medications on file prior to visit.    Review of Systems Pertinent items noted in HPI and remainder of comprehensive ROS otherwise negative. Physical Exam:   Vitals:  02/22/23 1534  BP: 112/80  Pulse: 85  Weight: 173 lb (78.5 kg)   Fetal Heart Rate (bpm): 160 Uterus:     System: General: well-developed, well-nourished female in no acute distress   Breasts:  normal appearance, no masses or tenderness bilaterally   Skin: normal coloration and turgor, no rashes   Neurologic: oriented, normal, negative, normal mood   Extremities: normal strength, tone, and muscle mass, ROM of all joints is normal   HEENT PERRLA, extraocular movement intact and sclera clear, anicteric   Mouth/Teeth mucous membranes moist, pharynx normal without lesions and dental hygiene good   Neck supple and no masses   Cardiovascular: regular rate and rhythm   Respiratory:  no respiratory distress, normal breath sounds   Assessment:    Pregnancy: G2P1001 Patient Active  Problem List   Diagnosis Date Noted   Supervision of high risk pregnancy, antepartum 02/22/2023   Postpartum anxiety 07/27/2021   S/P primary low transverse C-section 06/16/2021   History of herpes genitalis 12/13/2020   Anxiety and depression 05/12/2015   ADHD (attention deficit hyperactivity disorder)       Plan:    1. Supervision of high risk pregnancy, antepartum - Great to meet this patient! Doing well!  - US OB Limited; Future - CBC/D/Plt+RPR+Rh+ABO+RubIgG... - Culture, OB Urine - Cervicovaginal ancillary only - Comprehensive metabolic panel - Protein / creatinine ratio, urine - [place] MFM OB DETAIL +14 WK; Future - Panorama Prenatal Test Full Panel - SPECIAL REPORTS OR FORMS [id number] - Interpretation: - Urine Culture, OB Reflex  2. Hx of cesarean section - Desires TOLAC  3. [redacted] weeks gestation of pregnancy   4. History of herpes genitalis - Initiate suppression at 34 weeks  5. History of pregnancy induced hypertension  - aspirin chewable tablet 81 mg   Initial labs drawn. Continue prenatal vitamins. Genetic Screening discussed, First trimester screen, Quad screen, and NIPS: ordered. Ultrasound discussed; fetal anatomic survey: ordered. Problem list reviewed and updated. The nature of [place] with multiple MDs and other Advanced Practice Providers was explained to patient; also emphasized that residents, students are part of our team. Routine obstetric precautions reviewed. Return in about 4 weeks (around 03/22/2023) for CNM please, please schedule anatomy ultrasound.     Mallie Snooks, MSN, CNM Certified Nurse Midwife, Product/process development scientist for [employer], [place]

## 2023-02-25 LAB — PROTEIN / CREATININE RATIO, URINE
Creatinine, Urine: 43 mg/dL
Protein, Ur: 11.1 mg/dL
Protein/Creat Ratio: 258 mg/g creat — ABNORMAL HIGH (ref 0–200)

## 2023-02-26 LAB — CERVICOVAGINAL ANCILLARY ONLY
Chlamydia: NEGATIVE
Comment: NEGATIVE
Comment: NORMAL
Neisseria Gonorrhea: NEGATIVE

## 2023-02-28 LAB — PANORAMA PRENATAL TEST FULL PANEL:PANORAMA TEST PLUS 5 ADDITIONAL MICRODELETIONS: FETAL FRACTION: 7.6

## 2023-03-22 ENCOUNTER — Ambulatory Visit: Payer: Medicaid Other | Admitting: Advanced Practice Midwife

## 2023-03-22 VITALS — BP 120/80 | HR 97 | Wt 171.0 lb

## 2023-03-22 DIAGNOSIS — O0990 Supervision of high risk pregnancy, unspecified, unspecified trimester: Secondary | ICD-10-CM

## 2023-03-22 DIAGNOSIS — Z3A17 17 weeks gestation of pregnancy: Secondary | ICD-10-CM

## 2023-03-22 DIAGNOSIS — F419 Anxiety disorder, unspecified: Secondary | ICD-10-CM

## 2023-03-22 DIAGNOSIS — F32A Depression, unspecified: Secondary | ICD-10-CM

## 2023-03-22 DIAGNOSIS — Z8619 Personal history of other infectious and parasitic diseases: Secondary | ICD-10-CM

## 2023-03-22 DIAGNOSIS — Z98891 History of uterine scar from previous surgery: Secondary | ICD-10-CM

## 2023-03-22 MED ORDER — ASPIRIN 81 MG PO TBEC: 81.0000 mg | DELAYED_RELEASE_TABLET | Freq: Every day | ORAL | 2 refills | Status: DC

## 2023-03-22 NOTE — Progress Notes (Signed)
   PRENATAL VISIT NOTE  Subjective:  Judy Miller is a 27 y.o. G2P1001 at [redacted]w[redacted]d being seen today for ongoing prenatal care.  She is currently monitored for the following issues for this low-risk pregnancy and has ADHD (attention deficit hyperactivity disorder); Anxiety and depression; History of herpes genitalis; S/P primary low transverse C-section; Postpartum anxiety; and Supervision of high risk pregnancy, antepartum on their problem list.  Patient reports no complaints.  Contractions: Not present. Vag. Bleeding: None.   . Denies leaking of fluid.   The following portions of the patient's history were reviewed and updated as appropriate: allergies, current medications, past family history, past medical history, past social history, past surgical history and problem list. Problem list updated.  Objective:   Vitals:   03/22/23 1117  BP: 120/80  Pulse: 97  Weight: 171 lb (77.6 kg)    Fetal Status: Fetal Heart Rate (bpm): 154         General:  Alert, oriented and cooperative. Patient is in no acute distress.  Skin: Skin is warm and dry. No rash noted.   Cardiovascular: Normal heart rate noted  Respiratory: Normal respiratory effort, no problems with respiration noted  Abdomen: Soft, gravid, appropriate for gestational age.  Pain/Pressure: Absent     Pelvic: Cervical exam deferred        Extremities: Normal range of motion.     Mental Status: Normal mood and affect. Normal behavior. Normal judgment and thought content.   Assessment and Plan:  Pregnancy: G2P1001 at [redacted]w[redacted]d  1. Supervision of high risk pregnancy, antepartum - Enjoying second trimester - Strongly considering partner vasectomy  2. S/P primary low transverse C-section   3. Anxiety and depression - Stable  4. History of herpes genitalis - Plan for suppression at 34 weeks  5. [redacted] weeks gestation of pregnancy   Preterm labor symptoms and general obstetric precautions including but not limited to vaginal  bleeding, contractions, leaking of fluid and fetal movement were reviewed in detail with the patient. Please refer to After Visit Summary for other counseling recommendations.  Return in about 4 weeks (around 04/19/2023).  Future Appointments  Date Time Provider Department Center  04/05/2023  2:15 PM [facility] NURSE [facility]  04/05/2023  2:30 PM [facility] US[facility]  04/19/2023  2:30 PM Calvert Cantor, CNM [facility]  05/17/2023  2:30 PM Calvert Cantor, CNM [facility]    Calvert Cantor, [place]

## 2023-04-03 ENCOUNTER — Encounter: Payer: Self-pay | Admitting: *Deleted

## 2023-04-05 ENCOUNTER — Encounter: Payer: Self-pay | Admitting: *Deleted

## 2023-04-05 ENCOUNTER — Ambulatory Visit: Payer: Medicaid Other | Attending: Advanced Practice Midwife

## 2023-04-05 ENCOUNTER — Ambulatory Visit: Payer: Medicaid Other | Admitting: *Deleted

## 2023-04-05 VITALS — BP 124/68 | HR 77

## 2023-04-05 DIAGNOSIS — O34219 Maternal care for unspecified type scar from previous cesarean delivery: Secondary | ICD-10-CM | POA: Diagnosis not present

## 2023-04-05 DIAGNOSIS — B009 Herpesviral infection, unspecified: Secondary | ICD-10-CM

## 2023-04-05 DIAGNOSIS — O09292 Supervision of pregnancy with other poor reproductive or obstetric history, second trimester: Secondary | ICD-10-CM | POA: Diagnosis not present

## 2023-04-05 DIAGNOSIS — O98512 Other viral diseases complicating pregnancy, second trimester: Secondary | ICD-10-CM | POA: Diagnosis not present

## 2023-04-05 DIAGNOSIS — O0990 Supervision of high risk pregnancy, unspecified, unspecified trimester: Secondary | ICD-10-CM

## 2023-04-05 DIAGNOSIS — Z3A19 19 weeks gestation of pregnancy: Secondary | ICD-10-CM

## 2023-04-06 ENCOUNTER — Other Ambulatory Visit: Payer: Self-pay | Admitting: *Deleted

## 2023-04-06 DIAGNOSIS — O34219 Maternal care for unspecified type scar from previous cesarean delivery: Secondary | ICD-10-CM

## 2023-04-06 DIAGNOSIS — R000 Tachycardia, unspecified: Secondary | ICD-10-CM

## 2023-04-06 DIAGNOSIS — Z8759 Personal history of other complications of pregnancy, childbirth and the puerperium: Secondary | ICD-10-CM

## 2023-04-06 DIAGNOSIS — B009 Herpesviral infection, unspecified: Secondary | ICD-10-CM

## 2023-04-19 ENCOUNTER — Ambulatory Visit (INDEPENDENT_AMBULATORY_CARE_PROVIDER_SITE_OTHER): Payer: Medicaid Other | Admitting: Advanced Practice Midwife

## 2023-04-19 ENCOUNTER — Other Ambulatory Visit (HOSPITAL_COMMUNITY)
Admission: RE | Admit: 2023-04-19 | Discharge: 2023-04-19 | Disposition: A | Discharge status: Home / Self Care | Payer: Medicaid Other | Source: Ambulatory Visit | Attending: Advanced Practice Midwife | Admitting: Advanced Practice Midwife

## 2023-04-19 VITALS — BP 121/80 | HR 101 | Wt 173.0 lb

## 2023-04-19 DIAGNOSIS — Z3A21 21 weeks gestation of pregnancy: Secondary | ICD-10-CM

## 2023-04-19 DIAGNOSIS — N760 Acute vaginitis: Secondary | ICD-10-CM | POA: Insufficient documentation

## 2023-04-19 DIAGNOSIS — Z3482 Encounter for supervision of other normal pregnancy, second trimester: Secondary | ICD-10-CM

## 2023-04-19 NOTE — Progress Notes (Signed)
ROB    CC: Vaginal burning would like to do a self swab.reports recurrent yeats infections uses colloidal Silver 250 ppm to help with yeast infections however this time no relief.

## 2023-04-20 NOTE — Progress Notes (Signed)
   PRENATAL VISIT NOTE  Subjective:  Judy Miller is a 27 y.o. G2P1001 at [redacted]w[redacted]d being seen today for ongoing prenatal care.  She is currently monitored for the following issues for this low-risk pregnancy and has History of herpes genitalis and Supervision of high risk pregnancy, antepartum on their problem list.  Patient reports persistent vulvar irritation, abnormal discharge and spotting after intercourse. Patient has previously managed abnormal discharge with diluted colloidal silver with great resolution of symptoms. This treatment has not work for her this time.    Contractions: Not present. Vag. Bleeding: None.  Movement: Present. Denies leaking of fluid.   The following portions of the patient's history were reviewed and updated as appropriate: allergies, current medications, past family history, past medical history, past social history, past surgical history and problem list. Problem list updated.  Objective:   Vitals:   04/19/23 1440  BP: 121/80  Pulse: (!) 101  Weight: 173 lb (78.5 kg)    Fetal Status: Fetal Heart Rate (bpm): 158   Movement: Present     General:  Alert, oriented and cooperative. Patient is in no acute distress.  Skin: Skin is warm and dry. No rash noted.   Cardiovascular: Normal heart rate noted  Respiratory: Normal respiratory effort, no problems with respiration noted  Abdomen: Soft, gravid, appropriate for gestational age.  Pain/Pressure: Absent     Pelvic: Cervical exam deferred      Pelvic exam: External genitalia normal, no lesions, irritation or broken skin noted.   Extremities: Normal range of motion.  Edema: None  Mental Status: Normal mood and affect. Normal behavior. Normal judgment and thought content.   Assessment and Plan:  Pregnancy: G2P1001 at [redacted]w[redacted]d  1. Encounter for supervision of other normal pregnancy in second trimester - Routine care - S/p normal anatomy ultrasound, will need completion of fetal anatomy appointment  2.  Acute vaginitis - No overt abnormal findings on visual  - Cervicovaginal ancillary only( [place])  3. [redacted] weeks gestation of pregnancy   Preterm labor symptoms and general obstetric precautions including but not limited to vaginal bleeding, contractions, leaking of fluid and fetal movement were reviewed in detail with the patient. Please refer to After Visit Summary for other counseling recommendations.  Return in about 4 weeks (around 05/17/2023).  Future Appointments  Date Time Provider Department Center  05/11/2023  1:30 PM [facility] NURSE [facility]  05/11/2023  1:45 PM [facility] US4 [facility]  05/17/2023  2:30 PM Calvert Cantor, CNM [facility]  06/06/2023  9:00 AM [facility] LAB [facility]  06/06/2023  9:35 AM [place] Bing, MD [facility]  06/20/2023  2:30 PM Anyanwu, Jethro Bastos, MD [facility]  07/04/2023  2:30 PM Reva Bores, MD [facility]    Calvert Cantor, CNM

## 2023-04-23 ENCOUNTER — Other Ambulatory Visit: Payer: Self-pay | Admitting: *Deleted

## 2023-04-23 LAB — CERVICOVAGINAL ANCILLARY ONLY
Bacterial Vaginitis (gardnerella): NEGATIVE
Candida Glabrata: NEGATIVE
Candida Vaginitis: POSITIVE — AB
Comment: NEGATIVE
Comment: NEGATIVE
Comment: NEGATIVE

## 2023-04-23 MED ORDER — TERCONAZOLE 0.8 % VA CREA: 1.0000 | TOPICAL_CREAM | Freq: Every day | VAGINAL | 0 refills | Status: DC

## 2023-04-26 ENCOUNTER — Other Ambulatory Visit: Payer: Self-pay

## 2023-04-26 ENCOUNTER — Telehealth: Payer: Self-pay

## 2023-04-26 NOTE — Telephone Encounter (Signed)
Return call to pt regarding Rx not being covered.  Pt has a few insurances on file in chart ([employer] and [employer])  I LVM and sent pt message to confirm insurances with pharmacy and to also find out from Ins what Rx would be covered. And make [place] aware to send. Or She can purchase Monistat 7 day over the counter.

## 2023-04-26 NOTE — Telephone Encounter (Signed)
TC to pt regarding medication denial by Ins  Pt stated she needed Pre Auth I called and completed Pre Auth over the phone  Ref ID [id number] Effective 04/26/23-04/25/2024

## 2023-04-26 NOTE — Telephone Encounter (Signed)
Error

## 2023-04-26 NOTE — Telephone Encounter (Signed)
Pt noted lighting crotch  sensations and milk expressing from breast Pt advised can try tylenol , increase water intake and pay attention to any constipation as this can cause cramping and discomfort as well. Pt made aware it is normal to have milk in breast as long as no blood or severe pain etc of fever.  Pt made aware of precautions to go to [facility].   Pt agreeable and voiced understanding.

## 2023-05-04 ENCOUNTER — Ambulatory Visit: Payer: Medicaid Other

## 2023-05-11 ENCOUNTER — Ambulatory Visit (HOSPITAL_BASED_OUTPATIENT_CLINIC_OR_DEPARTMENT_OTHER): Payer: Medicaid Other

## 2023-05-11 ENCOUNTER — Ambulatory Visit: Payer: Medicaid Other | Attending: Obstetrics | Admitting: *Deleted

## 2023-05-11 VITALS — BP 130/67 | HR 82

## 2023-05-11 DIAGNOSIS — O0990 Supervision of high risk pregnancy, unspecified, unspecified trimester: Secondary | ICD-10-CM

## 2023-05-11 DIAGNOSIS — O34219 Maternal care for unspecified type scar from previous cesarean delivery: Secondary | ICD-10-CM

## 2023-05-11 DIAGNOSIS — O09292 Supervision of pregnancy with other poor reproductive or obstetric history, second trimester: Secondary | ICD-10-CM

## 2023-05-11 DIAGNOSIS — O98512 Other viral diseases complicating pregnancy, second trimester: Secondary | ICD-10-CM | POA: Insufficient documentation

## 2023-05-11 DIAGNOSIS — Z8759 Personal history of other complications of pregnancy, childbirth and the puerperium: Secondary | ICD-10-CM

## 2023-05-11 DIAGNOSIS — Z3A24 24 weeks gestation of pregnancy: Secondary | ICD-10-CM | POA: Diagnosis not present

## 2023-05-11 DIAGNOSIS — B009 Herpesviral infection, unspecified: Secondary | ICD-10-CM | POA: Diagnosis not present

## 2023-05-11 DIAGNOSIS — R000 Tachycardia, unspecified: Secondary | ICD-10-CM

## 2023-05-11 DIAGNOSIS — O10912 Unspecified pre-existing hypertension complicating pregnancy, second trimester: Secondary | ICD-10-CM | POA: Diagnosis not present

## 2023-05-17 ENCOUNTER — Encounter: Payer: Medicaid Other | Admitting: Advanced Practice Midwife

## 2023-05-17 ENCOUNTER — Encounter (HOSPITAL_COMMUNITY): Payer: Self-pay

## 2023-05-17 ENCOUNTER — Other Ambulatory Visit: Payer: Self-pay

## 2023-05-17 ENCOUNTER — Emergency Department (HOSPITAL_COMMUNITY)
Admission: EM | Admit: 2023-05-17 | Discharge: 2023-05-17 | Disposition: A | Discharge status: Home / Self Care | Payer: Medicaid Other | Attending: Emergency Medicine | Admitting: Emergency Medicine

## 2023-05-17 DIAGNOSIS — Z7982 Long term (current) use of aspirin: Secondary | ICD-10-CM | POA: Diagnosis not present

## 2023-05-17 DIAGNOSIS — R109 Unspecified abdominal pain: Secondary | ICD-10-CM

## 2023-05-17 DIAGNOSIS — N23 Unspecified renal colic: Secondary | ICD-10-CM | POA: Insufficient documentation

## 2023-05-17 DIAGNOSIS — R1032 Left lower quadrant pain: Secondary | ICD-10-CM | POA: Diagnosis present

## 2023-05-17 LAB — CBC
HCT: 40.5 % (ref 36.0–46.0)
Hemoglobin: 13.2 g/dL (ref 12.0–15.0)
MCH: 27.8 pg (ref 26.0–34.0)
MCHC: 32.6 g/dL (ref 30.0–36.0)
MCV: 85.3 fL (ref 80.0–100.0)
Platelets: 243 10*3/uL (ref 150–400)
RBC: 4.75 MIL/uL (ref 3.87–5.11)
RDW: 13.2 % (ref 11.5–15.5)
WBC: 12.2 10*3/uL — ABNORMAL HIGH (ref 4.0–10.5)
nRBC: 0 % (ref 0.0–0.2)

## 2023-05-17 LAB — BASIC METABOLIC PANEL
Anion gap: 12 (ref 5–15)
BUN: 6 mg/dL (ref 6–20)
CO2: 23 mmol/L (ref 22–32)
Calcium: 8.6 mg/dL — ABNORMAL LOW (ref 8.9–10.3)
Chloride: 101 mmol/L (ref 98–111)
Creatinine, Ser: 0.47 mg/dL (ref 0.44–1.00)
GFR, Estimated: >60 mL/min (ref >60)
Glucose, Bld: 96 mg/dL (ref 70–99)
Potassium: 3.6 mmol/L (ref 3.5–5.1)
Sodium: 136 mmol/L (ref 135–145)

## 2023-05-17 LAB — URINALYSIS, ROUTINE W REFLEX MICROSCOPIC
Bilirubin Urine: NEGATIVE
Glucose, UA: NEGATIVE mg/dL
Ketones, ur: NEGATIVE mg/dL
Leukocytes,Ua: NEGATIVE
Nitrite: NEGATIVE
Protein, ur: NEGATIVE mg/dL
RBC / HPF: >50 RBC/hpf (ref 0–5)
Specific Gravity, Urine: 1.015 (ref 1.005–1.030)
pH: 6 (ref 5.0–8.0)

## 2023-05-17 MED ORDER — ONDANSETRON 4 MG PO TBDP: 4.0000 mg | ORAL_TABLET | Freq: Three times a day (TID) | ORAL | 0 refills | Status: DC | PRN

## 2023-05-17 MED ORDER — OXYCODONE-ACETAMINOPHEN 5-325 MG PO TABS: 1.0000 | ORAL_TABLET | Freq: Four times a day (QID) | ORAL | 0 refills | Status: DC | PRN

## 2023-05-17 MED ORDER — SODIUM CHLORIDE 0.9 % IV BOLUS
1000.0000 mL | Freq: Once | INTRAVENOUS | Status: AC
Start: 2023-05-17 — End: 2023-05-17
  Administered 2023-05-17: 1000 mL via INTRAVENOUS

## 2023-05-17 NOTE — ED Provider Notes (Signed)
[place] EMERGENCY DEPARTMENT AT [facility] Provider Note   CSN: [id number] Arrival date & time: 05/17/23  1158     History  Chief Complaint  Patient presents with   Flank Pain    Judy Miller is a 27 y.o. female.  Pt is a 27 yo female with pmhx significant for kidney stones, adhd, anxiety, depression and current pregnancy at 25 weeks.  Pt said she developed left flank pain c/w prior kidney stones last night.  Pain was severe, but it has calmed down.  She is feeling baby move.  No vaginal bleeding or d/c.  No fevers.  No vomiting.  No dysuria or hematuria.       Home Medications Prior to Admission medications   Medication Sig Start Date End Date Taking? Authorizing Provider  acetaminophen (TYLENOL) 325 MG tablet Take 650 mg by mouth every 6 (six) hours as needed for mild pain.   Yes [provider]  ondansetron (ZOFRAN-ODT) 4 MG disintegrating tablet Take 1 tablet (4 mg total) by mouth every 8 (eight) hours as needed for nausea or vomiting. 05/17/23  Yes Jacalyn Lefevre, MD  oxyCODONE-acetaminophen (PERCOCET/ROXICET) 5-325 MG tablet Take 1 tablet by mouth every 6 (six) hours as needed for severe pain. 05/17/23  Yes Jacalyn Lefevre, MD  Prenatal Vit-Fe Fumarate-FA (MULTIVITAMIN-PRENATAL) 27-0.8 MG TABS tablet Take 1 tablet by mouth daily at 12 noon.   Yes [provider]  terconazole (TERAZOL 3) 0.8 % vaginal cream Place 1 applicator vaginally at bedtime. Apply nightly for three nights. Patient not taking: Reported on 05/17/2023 04/23/23   Calvert Cantor, CNM  aspirin EC 81 MG tablet Take 1 tablet (81 mg total) by mouth daily. Take after 12 weeks for prevention of preeclampsia later in pregnancy Patient not taking: Reported on 04/05/2023 03/22/23   Calvert Cantor, CNM  valACYclovir (VALTREX) 1000 MG tablet Take 1 tablet (1,000 mg total) by mouth daily. Patient not taking: Reported on 04/19/2023 05/16/21   Tereso Newcomer, MD       Allergies    Cod [fish allergy], Fish-derived products, Other, and Shellfish allergy    Review of Systems   Review of Systems  Genitourinary:  Positive for flank pain.  All other systems reviewed and are negative.   Physical Exam Updated Vital Signs BP (!) 138/99 (BP Location: Right Arm)   Pulse (!) 128   Temp 97.9 F (36.6 C) (Oral)   Resp 16   Ht 5\' 6"  (1.676 m)   Wt 78.5 kg   LMP 12/04/2022   SpO2 100%   BMI 27.93 kg/m  Physical Exam Vitals and nursing note reviewed.  Constitutional:      Appearance: Normal appearance.  HENT:     Head: Normocephalic and atraumatic.     Right Ear: External ear normal.     Left Ear: External ear normal.     Nose: Nose normal.     Mouth/Throat:     Mouth: Mucous membranes are moist.     Pharynx: Oropharynx is clear.  Eyes:     Extraocular Movements: Extraocular movements intact.     Conjunctiva/sclera: Conjunctivae normal.     Pupils: Pupils are equal, round, and reactive to light.  Cardiovascular:     Rate and Rhythm: Normal rate and regular rhythm.     Pulses: Normal pulses.     Heart sounds: Normal heart sounds.  Pulmonary:     Effort: Pulmonary effort is normal.     Breath sounds: Normal  breath sounds.  Abdominal:     General: Abdomen is flat. Bowel sounds are normal.     Palpations: Abdomen is soft.     Comments: + gravid abd  Musculoskeletal:        General: Normal range of motion.     Cervical back: Normal range of motion and neck supple.  Skin:    General: Skin is warm.     Capillary Refill: Capillary refill takes less than 2 seconds.  Neurological:     General: No focal deficit present.     Mental Status: She is alert and oriented to person, place, and time.  Psychiatric:        Mood and Affect: Mood normal.        Behavior: Behavior normal.     ED Results / Procedures / Treatments   Labs (all labs ordered are listed, but only abnormal results are displayed) Labs Reviewed  URINALYSIS, ROUTINE W REFLEX  MICROSCOPIC - Abnormal; Notable for the following components:      Result Value   APPearance HAZY (*)    Hgb urine dipstick LARGE (*)    Bacteria, UA RARE (*)    All other components within normal limits  BASIC METABOLIC PANEL - Abnormal; Notable for the following components:   Calcium 8.6 (*)    All other components within normal limits  CBC - Abnormal; Notable for the following components:   WBC 12.2 (*)    All other components within normal limits    EKG None  Radiology No results found.  Procedures Procedures    Medications Ordered in ED Medications  sodium chloride 0.9 % bolus 1,000 mL (1,000 mLs Intravenous New Bag/Given 05/17/23 1254)    ED Course/ Medical Decision Making/ A&P                             Medical Decision Making Amount and/or Complexity of Data Reviewed Labs: ordered.  Risk Prescription drug management.   This patient presents to the ED for concern of flank pain, this involves an extensive number of treatment options, and is a complaint that carries with it a high risk of complications and morbidity.  The differential diagnosis includes kidney stone, pyelo, pregnancy complication   Co morbidities that complicate the patient evaluation  idney stones, adhd, anxiety, depression and current pregnancy at 25 weeks   Additional history obtained:  Additional history obtained from epic chart review  Lab Tests:  I Ordered, and personally interpreted labs.  The pertinent results include:  cbc with wbc elevated at 12.2, bmp nl,   Cardiac Monitoring:  The patient was maintained on a cardiac monitor.  I personally viewed and interpreted the cardiac monitored which showed an underlying rhythm of: st initially, but now nsr   Medicines ordered and prescription drug management:  I ordered medication including ivfs  for sx  Reevaluation of the patient after these medicines showed that the patient improved I have reviewed the patients home medicines  and have made adjustments as needed   Test Considered:  Ct/us   Problem List / ED Course:  Flank pain:  hx kidney stones with hematuria and a mildly dilated left kidney on [place] means it's likely pain today is a kidney stone.  Pt does not have any pain now.  She is stable for d/c.  Return if worse.  F/u with urology. [redacted] weeks pregnant:  bedside US with good fetal activity.  Doubt a  pregnancy issue.   Reevaluation:  After the interventions noted above, I reevaluated the patient and found that they have :improved   Social Determinants of Health:  Lives at home   Dispostion:  After consideration of the diagnostic results and the patients response to treatment, I feel that the patent would benefit from discharge with outpatient f/u.          Final Clinical Impression(s) / ED Diagnoses Final diagnoses:  Left flank pain  Renal colic on left side    Rx / DC Orders ED Discharge Orders          Ordered    oxyCODONE-acetaminophen (PERCOCET/ROXICET) 5-325 MG tablet  Every 6 hours PRN        05/17/23 1346    ondansetron (ZOFRAN-ODT) 4 MG disintegrating tablet  Every 8 hours PRN        05/17/23 1346              Jacalyn Lefevre, MD 05/17/23 1352

## 2023-05-17 NOTE — ED Triage Notes (Addendum)
Pt c/o left flank pain and nausea started last night and got worse this morning. Pt denies urinary issues. Pt states [redacted] weeks pregnant

## 2023-05-18 ENCOUNTER — Telehealth: Payer: Self-pay

## 2023-05-18 NOTE — Transitions of Care (Post Inpatient/ED Visit) (Signed)
   05/18/2023  Name: Judy Miller MRN: [id number] DOB: 05-16-1996  Today's TOC FU Call Status: Today's TOC FU Call Status:: Successful TOC FU Call Competed TOC FU Call Complete Date: 05/18/23  Transition Care Management Follow-up Telephone Call Date of Discharge: 05/17/23 Discharge Facility: [facility]) Type of Discharge: Emergency Department Reason for ED Visit: Other: (left flank pain) How have you been since you were released from the hospital?: Better Any questions or concerns?: No  Items Reviewed: Did you receive and understand the discharge instructions provided?: Yes Medications obtained,verified, and reconciled?: Yes (Medications Reviewed) Any new allergies since your discharge?: No Dietary orders reviewed?: NA Do you have support at home?: Yes People in Home: other relative(s)  Medications Reviewed Today: Medications Reviewed Today     Reviewed by Annabell Sabal, CMA (Certified Medical Assistant) on 05/18/23 at 1148  Med List Status: <None>   Medication Order Taking? Sig Documenting Provider Last Dose Status Informant  acetaminophen (TYLENOL) 325 MG tablet [id number] Yes Take 650 mg by mouth every 6 (six) hours as needed for mild pain. [provider] Taking Active Self, Pharmacy Records  aspirin chewable tablet 81 mg [id number]   Calvert Cantor, CNM  Active   aspirin EC 81 MG tablet [id number] Yes Take 1 tablet (81 mg total) by mouth daily. Take after 12 weeks for prevention of preeclampsia later in pregnancy Calvert Cantor, CNM Taking Active Self, Pharmacy Records  ondansetron (ZOFRAN-ODT) 4 MG disintegrating tablet [id number] Yes Take 1 tablet (4 mg total) by mouth every 8 (eight) hours as needed for nausea or vomiting. Jacalyn Lefevre, MD Taking Active   oxyCODONE-acetaminophen (PERCOCET/ROXICET) 5-325 MG tablet [id number] Yes Take 1 tablet by mouth every 6 (six) hours as needed for severe pain. Jacalyn Lefevre, MD Taking Active   Prenatal  Vit-Fe Fumarate-FA (MULTIVITAMIN-PRENATAL) 27-0.8 MG TABS tablet [id number] Yes Take 1 tablet by mouth daily at 12 noon. [provider] Taking Active Self, Pharmacy Records  terconazole (TERAZOL 3) 0.8 % vaginal cream [id number] Yes Place 1 applicator vaginally at bedtime. Apply nightly for three nights. Calvert Cantor, CNM Taking Active Self, Pharmacy Records  valACYclovir (VALTREX) 1000 MG tablet [id number] Yes Take 1 tablet (1,000 mg total) by mouth daily. Tereso Newcomer, MD Taking Active Self, Pharmacy Records            Home Care and Equipment/Supplies: Were Home Health Services Ordered?: NA Any new equipment or medical supplies ordered?: NA  Functional Questionnaire: Do you need assistance with bathing/showering or dressing?: No Do you need assistance with meal preparation?: No Do you need assistance with eating?: No Do you have difficulty maintaining continence: No Do you need assistance with getting out of bed/getting out of a chair/moving?: No Do you have difficulty managing or taking your medications?: No  Follow up appointments reviewed: PCP Follow-up appointment confirmed?: NA Specialist Hospital Follow-up appointment confirmed?: NA Do you need transportation to your follow-up appointment?: No Do you understand care options if your condition(s) worsen?: Yes-patient verbalized understanding    SIGNATURE Fredirick Maudlin

## 2023-05-18 NOTE — Telephone Encounter (Signed)
Noted  

## 2023-05-21 ENCOUNTER — Inpatient Hospital Stay (HOSPITAL_COMMUNITY)
Admission: AD | Admit: 2023-05-21 | Discharge: 2023-05-21 | Disposition: A | Discharge status: Home / Self Care | Payer: Medicaid Other | Attending: Obstetrics and Gynecology | Admitting: Obstetrics and Gynecology

## 2023-05-21 ENCOUNTER — Inpatient Hospital Stay (HOSPITAL_COMMUNITY): Payer: Medicaid Other

## 2023-05-21 ENCOUNTER — Encounter (HOSPITAL_COMMUNITY): Payer: Self-pay | Admitting: Obstetrics and Gynecology

## 2023-05-21 DIAGNOSIS — M549 Dorsalgia, unspecified: Secondary | ICD-10-CM | POA: Diagnosis not present

## 2023-05-21 DIAGNOSIS — N2889 Other specified disorders of kidney and ureter: Secondary | ICD-10-CM | POA: Insufficient documentation

## 2023-05-21 DIAGNOSIS — O26832 Pregnancy related renal disease, second trimester: Secondary | ICD-10-CM | POA: Insufficient documentation

## 2023-05-21 DIAGNOSIS — O26892 Other specified pregnancy related conditions, second trimester: Secondary | ICD-10-CM | POA: Diagnosis not present

## 2023-05-21 DIAGNOSIS — Z3A25 25 weeks gestation of pregnancy: Secondary | ICD-10-CM

## 2023-05-21 DIAGNOSIS — Z87442 Personal history of urinary calculi: Secondary | ICD-10-CM | POA: Diagnosis not present

## 2023-05-21 DIAGNOSIS — R109 Unspecified abdominal pain: Secondary | ICD-10-CM | POA: Diagnosis not present

## 2023-05-21 DIAGNOSIS — F17290 Nicotine dependence, other tobacco product, uncomplicated: Secondary | ICD-10-CM | POA: Insufficient documentation

## 2023-05-21 DIAGNOSIS — O99332 Smoking (tobacco) complicating pregnancy, second trimester: Secondary | ICD-10-CM | POA: Diagnosis not present

## 2023-05-21 DIAGNOSIS — Z3A26 26 weeks gestation of pregnancy: Secondary | ICD-10-CM | POA: Insufficient documentation

## 2023-05-21 LAB — URINALYSIS, ROUTINE W REFLEX MICROSCOPIC
Bacteria, UA: NONE SEEN
Bilirubin Urine: NEGATIVE
Glucose, UA: NEGATIVE mg/dL
Ketones, ur: 5 mg/dL — AB
Leukocytes,Ua: NEGATIVE
Nitrite: NEGATIVE
Protein, ur: NEGATIVE mg/dL
Specific Gravity, Urine: 1.006 (ref 1.005–1.030)
pH: 7 (ref 5.0–8.0)

## 2023-05-21 MED ORDER — TAMSULOSIN HCL 0.4 MG PO CAPS: 0.4000 mg | ORAL_CAPSULE | Freq: Every day | ORAL | 0 refills | Status: DC

## 2023-05-21 NOTE — MAU Note (Signed)
.  Judy Miller is a 27 y.o. at [redacted]w[redacted]d here in MAU reporting: Diagnosed with kidney stone L side-hydronephrosis.  Pt states she does not know if it is to big to pass.  Denies uterine contraction-feels cramping LL groin. Using oxycodone for pain control. At 1400 took oxycodone for pain without relief. Baby is active and she denies SROM, vaginal bleeding or discharge    Onset of complaint:  Pain score: 3 Lside, back and with urination Pain is irregular in frequency Vitals:   05/21/23 1933  BP: 128/66  Pulse: 88  Resp: 16  Temp: 97.6 F (36.4 C)  SpO2: 100%     FHT:145bpm Lab orders placed from triage:  UA

## 2023-05-21 NOTE — MAU Provider Note (Signed)
History     [id number]  Arrival date and time: 05/21/23 1845    Chief Complaint  Patient presents with   Abdominal Pain   Back Pain   Dysuria   Urinary Frequency   Near Syncope    HPI Judy Miller is a 27 y.o. at [redacted]w[redacted]d by 13 week with PMHx notable for kidney stones in the past, who presents for intermittent left flank pain, back pain and abdominal pain. She reports onset of symptoms 4 days ago, was seen in the ER, had a bedside US and was suspected to have a kidney stone, she was discharged on oxycodone which she has been taken as needed.  She reports that current pain started about 2pm today, feels very intense, was more towards her groin area, and then feeling the pain at the tip of her urethra. She took oxycodone at home with no relief. She has not had any fever, chills, nausea or vomiting.   Vaginal bleeding: No LOF: No Fetal Movement: Yes Contractions: No  B/Positive/-- (03/21 1547)  OB History     Gravida  2   Para  1   Term  1   Preterm  0   AB  0   Living  1      SAB  0   IAB  0   Ectopic  0   Multiple  0   Live Births  1           Past Medical History:  Diagnosis Date   Acne vulgaris 05/12/2015   ADHD (attention deficit hyperactivity disorder)    ADHD (attention deficit hyperactivity disorder)    Anxiety    Anxiety and depression 05/12/2015   Fluoxetine 10mg  did not help with symptom control. Venlafaxine 37.5mg  two po qday did not help much with symptoms control.   Arthritis    history of this in her knees   Depression    Depression, major, single episode, mild (HCC) 04/23/2013   Generalized anxiety disorder 11/06/2013   Generalized anxiety disorder 11/06/2013   Genital herpes 11/2017   pos HSV 2 culture   Gestational hypertension, third trimester 06/13/2021   History of nephrolithiasis 03/07/2021   Postpartum anxiety 07/27/2021   Late July - started Zoloft 50mg    Renal stone 01/21/2019   Rhinitis, allergic 11/10/2015    S/P primary low transverse C-section 06/16/2021   Tachycardia 06/15/2021   Vaccine for human papilloma virus (HPV) types 6, 11, 16, and 18 administered     Past Surgical History:  Procedure Laterality Date   CESAREAN SECTION N/A 06/16/2021   Procedure: CESAREAN SECTION;  Surgeon: Federico Flake, MD;  Location: [facility];  Service: Obstetrics;  Laterality: N/A;    Family History  Problem Relation Age of Onset   Arthritis Mother    Varicose Veins Maternal Grandmother    Heart disease Paternal Grandmother    Varicose Veins Paternal Grandmother     Social History   Socioeconomic History   Marital status: Single    Spouse name: Not on file   Number of children: Not on file   Years of education: Not on file   Highest education level: Not on file  Occupational History   Not on file  Tobacco Use   Smoking status: Every Day    Types: E-cigarettes   Smokeless tobacco: Never  Vaping Use   Vaping Use: Every day   Substances: Nicotine  Substance and Sexual Activity   Alcohol use: Not Currently  Comment: barely   Drug use: No    Comment: history of    Sexual activity: Yes    Birth control/protection: None  Other Topics Concern   Not on file  Social History Narrative   Single.   Graduated from HS in 2017.    Student in cosmetology school.   Enjoys spending time outdoors, playing with her dog.   Social Determinants of Health   Financial Resource Strain: Not on file  Food Insecurity: No Food Insecurity (06/14/2021)   Hunger Vital Sign    Worried About Running Out of Food in the Last Year: Never true    Ran Out of Food in the Last Year: Never true  Transportation Needs: No Transportation Needs (06/14/2021)   PRAPARE - Administrator, Civil Service (Medical): No    Lack of Transportation (Non-Medical): No  Physical Activity: Not on file  Stress: Not on file  Social Connections: Not on file  Intimate Partner Violence: Not on file    Allergies   Allergen Reactions   Cod [Fish Allergy] Hives    Grouper fish   Fish-Derived Products Hives    Grouper fish   Other Hives and Other (See Comments)    Red hair dye Crab legs Grouper fish   Shellfish Allergy Other (See Comments)    Crab legs     No current facility-administered medications on file prior to encounter.   Current Outpatient Medications on File Prior to Encounter  Medication Sig Dispense Refill   oxyCODONE-acetaminophen (PERCOCET/ROXICET) 5-325 MG tablet Take 1 tablet by mouth every 6 (six) hours as needed for severe pain. 15 tablet 0   Prenatal Vit-Fe Fumarate-FA (MULTIVITAMIN-PRENATAL) 27-0.8 MG TABS tablet Take 1 tablet by mouth daily at 12 noon.     acetaminophen (TYLENOL) 325 MG tablet Take 650 mg by mouth every 6 (six) hours as needed for mild pain.     aspirin EC 81 MG tablet Take 1 tablet (81 mg total) by mouth daily. Take after 12 weeks for prevention of preeclampsia later in pregnancy 300 tablet 2   ondansetron (ZOFRAN-ODT) 4 MG disintegrating tablet Take 1 tablet (4 mg total) by mouth every 8 (eight) hours as needed for nausea or vomiting. 20 tablet 0   terconazole (TERAZOL 3) 0.8 % vaginal cream Place 1 applicator vaginally at bedtime. Apply nightly for three nights. 20 g 0   valACYclovir (VALTREX) 1000 MG tablet Take 1 tablet (1,000 mg total) by mouth daily. 60 tablet 3     Review of Systems  Constitutional:  Negative for chills and fever.  Eyes:  Negative for blurred vision.  Cardiovascular:  Negative for leg swelling.  Gastrointestinal:  Positive for abdominal pain. Negative for vomiting.  Genitourinary:  Positive for dysuria. Negative for frequency and urgency.  Musculoskeletal:  Positive for back pain. Negative for myalgias.  Skin:  Negative for itching.  Neurological:  Negative for dizziness.   Pertinent positives and negative per HPI, all others reviewed and negative  Physical Exam   BP 117/75   Pulse 97   Temp 97.6 F (36.4 C) (Oral)    Resp 16   Ht 5\' 6"  (1.676 m)   Wt 79.8 kg   LMP 12/04/2022   SpO2 100%   BMI 28.41 kg/m   Patient Vitals for the past 24 hrs:  BP Temp Temp src Pulse Resp SpO2 Height Weight  05/21/23 2047 -- -- -- -- -- 100 % -- --  05/21/23 1958 117/75 -- -- 97 -- -- -- --  05/21/23 1933 128/66 97.6 F (36.4 C) Oral 88 16 100 % 5\' 6"  (1.676 m) 79.8 kg    Physical Exam Vitals reviewed.  Constitutional:      General: She is not in acute distress (pain is mostly relieved at the time of my evaluation).    Appearance: She is well-developed. She is not toxic-appearing.  HENT:     Head: Normocephalic and atraumatic.     Mouth/Throat:     Mouth: Mucous membranes are moist.  Eyes:     Extraocular Movements: Extraocular movements intact.  Cardiovascular:     Rate and Rhythm: Normal rate.  Pulmonary:     Effort: Pulmonary effort is normal. No respiratory distress.  Abdominal:     Palpations: Abdomen is soft.     Tenderness: There is no abdominal tenderness. There is no right CVA tenderness or left CVA tenderness.  Skin:    General: Skin is warm and dry.  Neurological:     Mental Status: She is alert and oriented to person, place, and time.  Psychiatric:        Mood and Affect: Mood normal.        Behavior: Behavior normal.     FHT Baseline: 140 bpm Variability: moderate Accelerations: >2 15 X 15 accelerations Decelerations: Absent Uterine activity: None Reactive NST  No contractions noted on the monitor.  Labs Results for orders placed or performed during the hospital encounter of 05/21/23 (from the past 24 hour(s))  Urinalysis, Routine w reflex microscopic -Urine, Clean Catch     Status: Abnormal   Collection Time: 05/21/23  7:32 PM  Result Value Ref Range   Color, Urine YELLOW YELLOW   APPearance CLEAR CLEAR   Specific Gravity, Urine 1.006 1.005 - 1.030   pH 7.0 5.0 - 8.0   Glucose, UA NEGATIVE NEGATIVE mg/dL   Hgb urine dipstick LARGE (A) NEGATIVE   Bilirubin Urine NEGATIVE  NEGATIVE   Ketones, ur 5 (A) NEGATIVE mg/dL   Protein, ur NEGATIVE NEGATIVE mg/dL   Nitrite NEGATIVE NEGATIVE   Leukocytes,Ua NEGATIVE NEGATIVE   RBC / HPF 21-50 0 - 5 RBC/hpf   WBC, UA 0-5 0 - 5 WBC/hpf   Bacteria, UA NONE SEEN NONE SEEN   Squamous Epithelial / HPF 0-5 0 - 5 /HPF   Mucus PRESENT     Imaging US RENAL  Result Date: 05/21/2023 CLINICAL DATA:  Renal colic on the left side. Twenty-five weeks pregnant. EXAM: RENAL / URINARY TRACT ULTRASOUND COMPLETE COMPARISON:  None Available. FINDINGS: Right Kidney: Renal measurements: 11.9 x 5.3 x 5.4 cm = volume: 178 mL. Echogenicity within normal limits. There is right-sided caliectasis without definite hydronephrosis. No focal lesions are seen. Left Kidney: Renal measurements: 11.8 x 5.9 x 4.1 cm = volume: 149 mL. Echogenicity within normal limits. No mass or hydronephrosis visualized. Bladder: Under distended/not evaluated. Other: None. IMPRESSION: Right-sided caliectasis without definite hydronephrosis. Electronically Signed   By: Darliss Cheney M.D.   On: 05/21/2023 21:15    MAU Course  Procedures  Lab Orders         Urinalysis, Routine w reflex microscopic -Urine, Clean Catch    Meds ordered this encounter  Medications   tamsulosin (FLOMAX) 0.4 MG CAPS capsule    Sig: Take 1 capsule (0.4 mg total) by mouth daily.    Dispense:  10 capsule    Refill:  0    Imaging Orders         US RENAL     MDM Moderate (  Level 3-4)  Assessment and Plan  #Left flank pain  [redacted] weeks gestation of pregnancy  Personal history of kidney stones  27 y.o. G2P1001 at [redacted]w[redacted]d here with flank pain. At the time of my evaluation, pain was down to a 2/10. Suspected urolithiasis, unclear if it has passed. Renal US done shows right sided caliectasis, but no hydronephrosis in either kidney. Also shows no obvious stones. Limitations of renal US is acknowledged, however CT AB deferred, given pregnancy.  UA shows hematuria, with no signs of UTI. Appears well  hydrated. - encouraged to continue good hydration - can take oxycodone with tylenol prn for pain - rx of tamsulosin sent to help aid stone passage  #FWB NST: Reactive   Dispo: discharged to home in stable condition  Allergies as of 05/21/2023       Reactions   Cod [fish Allergy] Hives   Grouper fish   Fish-derived Products Hives   Grouper fish   Other Hives, Other (See Comments)   Red hair dye Crab legs Grouper fish   Shellfish Allergy Other (See Comments)   Crab legs        Medication List     STOP taking these medications    acetaminophen 325 MG tablet Commonly known as: TYLENOL   terconazole 0.8 % vaginal cream Commonly known as: TERAZOL 3   valACYclovir 1000 MG tablet Commonly known as: VALTREX       TAKE these medications    aspirin EC 81 MG tablet Take 1 tablet (81 mg total) by mouth daily. Take after 12 weeks for prevention of preeclampsia later in pregnancy   multivitamin-prenatal 27-0.8 MG Tabs tablet Take 1 tablet by mouth daily at 12 noon.   ondansetron 4 MG disintegrating tablet Commonly known as: ZOFRAN-ODT Take 1 tablet (4 mg total) by mouth every 8 (eight) hours as needed for nausea or vomiting.   oxyCODONE-acetaminophen 5-325 MG tablet Commonly known as: PERCOCET/ROXICET Take 1 tablet by mouth every 6 (six) hours as needed for severe pain.   tamsulosin 0.4 MG Caps capsule Commonly known as: FLOMAX Take 1 capsule (0.4 mg total) by mouth daily.        Sheppard Evens MD MPH OB Fellow, Faculty Practice [facility] 05/24/2023

## 2023-05-24 ENCOUNTER — Ambulatory Visit (INDEPENDENT_AMBULATORY_CARE_PROVIDER_SITE_OTHER): Payer: Medicaid Other | Admitting: Obstetrics & Gynecology

## 2023-05-24 VITALS — BP 120/79 | HR 118 | Wt 178.0 lb

## 2023-05-24 DIAGNOSIS — Z3A26 26 weeks gestation of pregnancy: Secondary | ICD-10-CM

## 2023-05-24 DIAGNOSIS — N200 Calculus of kidney: Secondary | ICD-10-CM

## 2023-05-24 DIAGNOSIS — O0990 Supervision of high risk pregnancy, unspecified, unspecified trimester: Secondary | ICD-10-CM

## 2023-05-24 NOTE — Progress Notes (Signed)
   PRENATAL VISIT NOTE  Subjective:  Judy Miller is a 27 y.o. G2P1001 at [redacted]w[redacted]d being seen today for ongoing prenatal care.  She is currently monitored for the following issues for this high-risk pregnancy and has Kidney stones; History of herpes genitalis; and Supervision of high risk pregnancy, antepartum on their problem list.  Patient reports she passed a kidney stone on 05/22/23.  Feeling better now.  Contractions: Not present. Vag. Bleeding: None.  Movement: Present. Denies leaking of fluid.   The following portions of the patient's history were reviewed and updated as appropriate: allergies, current medications, past family history, past medical history, past social history, past surgical history and problem list.   Objective:   Vitals:   05/24/23 1036  BP: 120/79  Pulse: (!) 118  Weight: 178 lb (80.7 kg)    Fetal Status: Fetal Heart Rate (bpm): 150 Fundal Height: 25 cm Movement: Present     General:  Alert, oriented and cooperative. Patient is in no acute distress.  Skin: Skin is warm and dry. No rash noted.   Cardiovascular: Normal heart rate noted  Respiratory: Normal respiratory effort, no problems with respiration noted  Abdomen: Soft, gravid, appropriate for gestational age.  Pain/Pressure: Absent     Pelvic: Cervical exam deferred        Extremities: Normal range of motion.  Edema: None  Mental Status: Normal mood and affect. Normal behavior. Normal judgment and thought content.   Assessment and Plan:  Pregnancy: G2P1001 at [redacted]w[redacted]d 1. Kidney stones Will continue to monitor.  Adequate hydration recommended, continue Flomax as needed.  2. [redacted] weeks gestation of pregnancy 3. Supervision of high risk pregnancy, antepartum No other concerns. Third trimester labs next visit. Preterm labor symptoms and general obstetric precautions including but not limited to vaginal bleeding, contractions, leaking of fluid and fetal movement were reviewed in detail with the  patient. Please refer to After Visit Summary for other counseling recommendations.   Return in about 13 days (around 06/06/2023) for 2 hr GTT, 3rd trimester labs, TDap, OFFICE OB VISIT (MD or APP).  Future Appointments  Date Time Provider Department Center  06/06/2023  9:00 AM [facility]  06/06/2023  9:35 AM [place] Bing, MD [facility]  06/20/2023  2:30 PM Javion Holmer, Jethro Bastos, MD [facility]  07/04/2023  2:30 PM Reva Bores, MD [facility]  07/16/2023  3:50 PM Federico Flake, MD [facility]  07/30/2023  3:30 PM Federico Flake, MD [facility]    Jaynie Collins, MD

## 2023-05-24 NOTE — Patient Instructions (Addendum)
Return to office for any scheduled appointments. Call the office or go to the MAU at [facility] if: You begin to have strong, frequent contractions Your water breaks.  Sometimes it is a big gush of fluid, sometimes it is just a trickle that keeps getting your underwear wet or running down your legs You have vaginal bleeding.  It is normal to have a small amount of spotting if your cervix was checked.  You do not feel your baby moving like normal.  If you do not, get something to eat and drink and lay down and focus on feeling your baby move.   If your baby is still not moving like normal, you should call the office or go to MAU. Any other obstetric concerns.   TDaP Vaccine Pregnancy Get the Whooping Cough Vaccine While You Are Pregnant (CDC)  It is important for women to get the whooping cough vaccine in the third trimester of each pregnancy. Vaccines are the best way to prevent this disease. There are 2 different whooping cough vaccines. Both vaccines combine protection against whooping cough, tetanus and diphtheria, but they are for different age groups: Tdap: for everyone 11 years or older, including pregnant women  DTaP: for children 2 months through 6 years of age  You need the whooping cough vaccine during each of your pregnancies The recommended time to get the shot is during your 27th through 36th week of pregnancy, preferably during the earlier part of this time period. The Centers for Disease Control and Prevention (CDC) recommends that pregnant women receive the whooping cough vaccine for adolescents and adults (called Tdap vaccine) during the third trimester of each pregnancy. The recommended time to get the shot is during your 27th through 36th week of pregnancy, preferably during the earlier part of this time period. This replaces the original recommendation that pregnant women get the vaccine only if they had not previously received it. The American  College of Obstetricians and Gynecologists and the American College of Nurse-Midwives support this recommendation.  You should get the whooping cough vaccine while pregnant to pass protection to your baby frame support disabled and/or not supported in this browser  Learn why Judy Miller decided to get the whooping cough vaccine in her 3rd trimester of pregnancy and how her baby girl was born with some protection against the disease. Also available on YouTube. After receiving the whooping cough vaccine, your body will create protective antibodies (proteins produced by the body to fight off diseases) and pass some of them to your baby before birth. These antibodies provide your baby some short-term protection against whooping cough in early life. These antibodies can also protect your baby from some of the more serious complications that come along with whooping cough. Your protective antibodies are at their highest about 2 weeks after getting the vaccine, but it takes time to pass them to your baby. So the preferred time to get the whooping cough vaccine is early in your third trimester. The amount of whooping cough antibodies in your body decreases over time. That is why CDC recommends you get a whooping cough vaccine during each pregnancy. Doing so allows each of your babies to get the greatest number of protective antibodies from you. This means each of your babies will get the best protection possible against this disease.  Getting the whooping cough vaccine while pregnant is better than getting the vaccine after you give birth Whooping cough vaccination during pregnancy is ideal so   your baby will have short-term protection as soon as he is born. This early protection is important because your baby will not start getting his whooping cough vaccines until he is 2 months old. These first few months of life are when your baby is at greatest risk for catching whooping cough. This is also when he's at greatest  risk for having severe, potentially life-threating complications from the infection. To avoid that gap in protection, it is best to get a whooping cough vaccine during pregnancy. You will then pass protection to your baby before he is born. To continue protecting your baby, he should get whooping cough vaccines starting at 2 months old. You may never have gotten the Tdap vaccine before and did not get it during this pregnancy. If so, you should make sure to get the vaccine immediately after you give birth, before leaving the hospital or birthing center. It will take about 2 weeks before your body develops protection (antibodies) in response to the vaccine. Once you have protection from the vaccine, you are less likely to give whooping cough to your newborn while caring for him. But remember, your baby will still be at risk for catching whooping cough from others. A recent study looked to see how effective Tdap was at preventing whooping cough in babies whose mothers got the vaccine while pregnant or in the hospital after giving birth. The study found that getting Tdap between 27 through 36 weeks of pregnancy is 85% more effective at preventing whooping cough in babies younger than 2 months old. Blood tests cannot tell if you need a whooping cough vaccine There are no blood tests that can tell you if you have enough antibodies in your body to protect yourself or your baby against whooping cough. Even if you have been sick with whooping cough in the past or previously received the vaccine, you still should get the vaccine during each pregnancy. Breastfeeding may pass some protective antibodies onto your baby By breastfeeding, you may pass some antibodies you have made in response to the vaccine to your baby. When you get a whooping cough vaccine during your pregnancy, you will have antibodies in your breast milk that you can share with your baby as soon as your milk comes in. However, your baby will not get  protective antibodies immediately if you wait to get the whooping cough vaccine until after delivering your baby. This is because it takes about 2 weeks for your body to create antibodies. Learn more about the health benefits of breastfeeding.   Oral Glucose Tolerance Test During Pregnancy Why am I having this test? The oral glucose tolerance test (GTT) is done to check how your body processes blood sugar (glucose). This is one of several tests used to diagnose diabetes that develops during pregnancy (gestational diabetes mellitus). Gestational diabetes is a short-term form of diabetes that some women develop while they are pregnant. It usually occurs during the second or third trimester of pregnancy and goes away after delivery. Testing, or screening, for gestational diabetes usually occurs around 48 of pregnancy. This test may also be needed earlier if: You have a history of gestational diabetes. There is a history of giving birth to very large babies or of losing pregnancies (having stillbirths). You have signs and symptoms of diabetes, such as: Changes in your eyesight. Tingling or numbness in your hands or feet. Changes in hunger, thirst, and urination, and these are not explained by your pregnancy. What is being tested? This test measures  the amount of glucose in your blood at different times during a period of 2 hours. This shows how well your body can process glucose.  You will have three separate blood draws. What kind of sample is taken?  Blood samples are required for this test. They are usually collected by inserting a needle into a blood vessel. How do I prepare for this test? For 3 days before your test, eat normally. Have plenty of carbohydrate-rich foods. You will be asked not to eat or drink anything other than water (to fast) starting 8-10 hours before the test. Tell a health care provider about: All medicines you are taking, including vitamins, herbs, eye drops, creams, and  over-the-counter medicines. Any blood disorders you have. Any surgeries you have had. Any medical conditions you have. What happens during the test? First, your blood glucose will be measured. This is referred to as your fasting blood glucose because you fasted before the test. Then, you will drink a glucose solution that contains a certain amount of glucose. Your blood glucose will be measured again 1 and 2 hours after you drink the solution. This test takes about 2 hours to complete. You will need to stay at the testing location during this time. During the testing period: Do not eat or drink anything other than the glucose solution. Do not exercise. Do not use any products that contain nicotine or tobacco, such as cigarettes, e-cigarettes, and chewing tobacco. These can affect your test results. If you need help quitting, ask your health care provider. The testing procedure may vary among health care providers and hospitals. How are the results reported? Your results will be reported as milligrams of glucose per deciliter of blood (mg/dL) or millimoles per liter (mmol/L). There is more than one source for screening and diagnosis reference values used to diagnose gestational diabetes. Your health care provider will compare your results to normal values that were established after testing a large group of people (reference values). Reference values may vary among labs and hospitals. For this test, reference values are: Fasting: 92 mg/dL 1 hour: 478 mg/dL  2 hour: 295 mg/dL   What do the results mean? Results below the reference values are considered normal. If one or more of your blood glucose levels are at or above the reference values, you will be diagnosed with gestational diabetes.  Talk with your health care provider about what your results mean. Questions to ask your health care provider Ask your health care provider, or the department that is doing the test: When will my results be  ready? How will I get my results? What are my treatment options? What other tests do I need? What are my next steps? Summary The oral glucose tolerance test (GTT) is one of several tests used to diagnose diabetes that develops during pregnancy (gestational diabetes mellitus). Gestational diabetes is a short-term form of diabetes that some women develop while they are pregnant. You may also have this test if you have any symptoms or risk factors for this type of diabetes. Talk with your health care provider about what your results mean. This information is not intended to replace advice given to you by your health care provider. Make sure you discuss any questions you have with your health care provider.

## 2023-06-06 ENCOUNTER — Ambulatory Visit (INDEPENDENT_AMBULATORY_CARE_PROVIDER_SITE_OTHER): Payer: Medicaid Other | Admitting: Obstetrics and Gynecology

## 2023-06-06 ENCOUNTER — Other Ambulatory Visit: Payer: Medicaid Other

## 2023-06-06 VITALS — BP 116/78 | HR 83 | Wt 179.0 lb

## 2023-06-06 DIAGNOSIS — Z8619 Personal history of other infectious and parasitic diseases: Secondary | ICD-10-CM

## 2023-06-06 DIAGNOSIS — N200 Calculus of kidney: Secondary | ICD-10-CM

## 2023-06-06 DIAGNOSIS — O0990 Supervision of high risk pregnancy, unspecified, unspecified trimester: Secondary | ICD-10-CM

## 2023-06-06 NOTE — Progress Notes (Signed)
   PRENATAL VISIT NOTE  Subjective:  Judy Miller is a 27 y.o. G2P1001 at [redacted]w[redacted]d being seen today for ongoing prenatal care.  She is currently monitored for the following issues for this low-risk pregnancy and has Kidney stones; History of herpes genitalis; and Supervision of high risk pregnancy, antepartum on their problem list.  Patient reports no complaints.  Contractions: Not present. Vag. Bleeding: None.  Movement: Present. Denies leaking of fluid.   The following portions of the patient's history were reviewed and updated as appropriate: allergies, current medications, past family history, past medical history, past social history, past surgical history and problem list.   Objective:   Vitals:   06/06/23 0919  BP: 116/78  Pulse: 83  Weight: 179 lb (81.2 kg)    Fetal Status: Fetal Heart Rate (bpm): 151 Fundal Height: 27 cm Movement: Present     General:  Alert, oriented and cooperative. Patient is in no acute distress.  Skin: Skin is warm and dry. No rash noted.   Cardiovascular: Normal heart rate noted  Respiratory: Normal respiratory effort, no problems with respiration noted  Abdomen: Soft, gravid, appropriate for gestational age.  Pain/Pressure: Absent     Pelvic: Cervical exam deferred        Extremities: Normal range of motion.  Edema: None  Mental Status: Normal mood and affect. Normal behavior. Normal judgment and thought content.   Assessment and Plan:  Pregnancy: G2P1001 at [redacted]w[redacted]d 1. Supervision of high risk pregnancy, antepartum 28wk labs today Watch FH. Total weight gain goal d/w her  2. Kidney stones No current issues. Strategies d/w her   3. History of herpes genitalis Start ppx 34-36wks.   Preterm labor symptoms and general obstetric precautions including but not limited to vaginal bleeding, contractions, leaking of fluid and fetal movement were reviewed in detail with the patient. Please refer to After Visit Summary for other counseling  recommendations.   No follow-ups on file.  Future Appointments  Date Time Provider Department Center  06/06/2023  9:35 AM [place] Bing, MD [facility]  06/20/2023  2:30 PM Anyanwu, Jethro Bastos, MD [facility]  07/04/2023  2:30 PM Reva Bores, MD [facility]  07/16/2023  3:50 PM Federico Flake, MD [facility]  07/30/2023  3:30 PM Federico Flake, MD [facility]    [place] Bing, MD

## 2023-06-06 NOTE — Progress Notes (Signed)
ROB: denies any concerns

## 2023-06-07 LAB — CBC
Hematocrit: 38.1 % (ref 34.0–46.6)
Hemoglobin: 12.3 g/dL (ref 11.1–15.9)
MCH: 27.5 pg (ref 26.6–33.0)
MCHC: 32.3 g/dL (ref 31.5–35.7)
MCV: 85 fL (ref 79–97)
Platelets: 244 10*3/uL (ref 150–450)
RBC: 4.47 x10E6/uL (ref 3.77–5.28)
RDW: 12.8 % (ref 11.7–15.4)
WBC: 12 10*3/uL — ABNORMAL HIGH (ref 3.4–10.8)

## 2023-06-07 LAB — GLUCOSE TOLERANCE, 2 HOURS W/ 1HR
Glucose, 1 hour: 144 mg/dL (ref 70–179)
Glucose, 2 hour: 135 mg/dL (ref 70–152)
Glucose, Fasting: 73 mg/dL (ref 70–91)

## 2023-06-07 LAB — HIV ANTIBODY (ROUTINE TESTING W REFLEX): HIV Screen 4th Generation wRfx: NONREACTIVE

## 2023-06-07 LAB — RPR: RPR Ser Ql: NONREACTIVE

## 2023-06-14 ENCOUNTER — Telehealth: Payer: Self-pay | Admitting: *Deleted

## 2023-06-14 ENCOUNTER — Encounter: Payer: Self-pay | Admitting: Obstetrics and Gynecology

## 2023-06-14 MED ORDER — HYDROXYZINE HCL 25 MG PO TABS: 25.0000 mg | ORAL_TABLET | Freq: Four times a day (QID) | ORAL | 2 refills | Status: DC | PRN

## 2023-06-14 NOTE — Telephone Encounter (Signed)
Called pt back. Pt states her HR was going up and feeling out of breath with activity and talking. Pt has history of anxiety. Discussed hydrating and drinking more during these hot days as dehydration can increase HR. Pt states she has maybe only been drinking 5 bottles of water a day. Informed she needs to at least double that and if outside, increase it more. Also told pt we could call in a medication she can take as needed if she is feeling more anxious. Advised top start with hydration and medication and if she doesn't feel better and gets any shortness of breath to go to MAU. Pt verbalizes and agrees with plan, and will talk with provider at her next visit about taking something more regular for her anxiety.

## 2023-06-19 ENCOUNTER — Telehealth: Payer: Self-pay | Admitting: *Deleted

## 2023-06-19 NOTE — Telephone Encounter (Signed)
Faxed back breast pump order to [facility]

## 2023-06-20 ENCOUNTER — Ambulatory Visit: Payer: Medicaid Other | Admitting: Obstetrics & Gynecology

## 2023-06-20 ENCOUNTER — Encounter: Payer: Self-pay | Admitting: Obstetrics & Gynecology

## 2023-06-20 VITALS — BP 129/82 | HR 121 | Wt 185.0 lb

## 2023-06-20 DIAGNOSIS — O0990 Supervision of high risk pregnancy, unspecified, unspecified trimester: Secondary | ICD-10-CM

## 2023-06-20 DIAGNOSIS — F419 Anxiety disorder, unspecified: Secondary | ICD-10-CM

## 2023-06-20 DIAGNOSIS — Z8619 Personal history of other infectious and parasitic diseases: Secondary | ICD-10-CM

## 2023-06-20 DIAGNOSIS — Z3A29 29 weeks gestation of pregnancy: Secondary | ICD-10-CM

## 2023-06-20 DIAGNOSIS — O99340 Other mental disorders complicating pregnancy, unspecified trimester: Secondary | ICD-10-CM

## 2023-06-20 MED ORDER — BUSPIRONE HCL 10 MG PO TABS: 10.0000 mg | ORAL_TABLET | Freq: Two times a day (BID) | ORAL | 2 refills | Status: DC

## 2023-06-20 MED ORDER — VALACYCLOVIR HCL 1 G PO TABS: 1000.0000 mg | ORAL_TABLET | Freq: Every day | ORAL | 2 refills | Status: AC

## 2023-06-20 NOTE — Progress Notes (Signed)
   PRENATAL VISIT NOTE  Subjective:  Judy Miller is a 27 y.o. G2P1001 at [redacted]w[redacted]d being seen today for ongoing prenatal care.  She is currently monitored for the following issues for this high-risk pregnancy and has Kidney stones; History of herpes genitalis; and Supervision of high risk pregnancy, antepartum on their problem list.  Patient reports feeling very anxious.  Also wants to get started on herpes suppression medication.  Contractions: Not present. Vag. Bleeding: None.  Movement: Present. Denies leaking of fluid.   The following portions of the patient's history were reviewed and updated as appropriate: allergies, current medications, past family history, past medical history, past social history, past surgical history and problem list.   Objective:   Vitals:   06/20/23 1436  BP: 129/82  Pulse: (!) 121  Weight: 185 lb (83.9 kg)    Fetal Status: Fetal Heart Rate (bpm): 153 Fundal Height: 30 cm Movement: Present     General:  Alert, oriented and cooperative. Patient is in no acute distress.  Skin: Skin is warm and dry. No rash noted.   Cardiovascular: Normal heart rate noted  Respiratory: Normal respiratory effort, no problems with respiration noted  Abdomen: Soft, gravid, appropriate for gestational age.  Pain/Pressure: Absent     Pelvic: Cervical exam deferred        Extremities: Normal range of motion.  Edema: None  Mental Status: Normal mood and affect. Normal behavior. Normal judgment and thought content.   Assessment and Plan:  Pregnancy: G2P1001 at [redacted]w[redacted]d 1. Anxiety during pregnancy Talked about Buspar vs Zoloft, already on Atarax.  Wants to try Buspar for now, will monitor effec. - busPIRone (BUSPAR) 10 MG tablet; Take 1 tablet (10 mg total) by mouth 2 (two) times daily.  Dispense: 60 tablet; Refill: 2  2. History of herpes genitalis Valtrex suppression prescribed.  - valACYclovir (VALTREX) 1000 MG tablet; Take 1 tablet (1,000 mg total) by mouth daily.   Dispense: 30 tablet; Refill: 2  3. [redacted] weeks gestation of pregnancy 4. Supervision of high risk pregnancy, antepartum Normal third trimester labs.  Preterm labor symptoms and general obstetric precautions including but not limited to vaginal bleeding, contractions, leaking of fluid and fetal movement were reviewed in detail with the patient. Please refer to After Visit Summary for other counseling recommendations.   Return for OB visits as scheduled.  Future Appointments  Date Time Provider Department Center  07/04/2023  2:30 PM Reva Bores, MD [facility]  07/16/2023  3:50 PM Federico Flake, MD [facility]  07/30/2023  3:30 PM Federico Flake, MD [facility]    Jaynie Collins, MD

## 2023-07-04 ENCOUNTER — Ambulatory Visit (INDEPENDENT_AMBULATORY_CARE_PROVIDER_SITE_OTHER): Payer: Medicaid Other | Admitting: Family Medicine

## 2023-07-04 VITALS — BP 116/78 | HR 108 | Wt 183.0 lb

## 2023-07-04 DIAGNOSIS — O26893 Other specified pregnancy related conditions, third trimester: Secondary | ICD-10-CM

## 2023-07-04 DIAGNOSIS — O0990 Supervision of high risk pregnancy, unspecified, unspecified trimester: Secondary | ICD-10-CM

## 2023-07-04 DIAGNOSIS — R0602 Shortness of breath: Secondary | ICD-10-CM

## 2023-07-04 DIAGNOSIS — Z8619 Personal history of other infectious and parasitic diseases: Secondary | ICD-10-CM

## 2023-07-04 DIAGNOSIS — O34219 Maternal care for unspecified type scar from previous cesarean delivery: Secondary | ICD-10-CM | POA: Insufficient documentation

## 2023-07-04 NOTE — Progress Notes (Signed)
   PRENATAL VISIT NOTE  Subjective:  Judy Miller is a 27 y.o. G2P1001 at [redacted]w[redacted]d being seen today for ongoing prenatal care.  She is currently monitored for the following issues for this low-risk pregnancy and has Kidney stones; History of herpes genitalis; Supervision of high risk pregnancy, antepartum; and Previous cesarean delivery affecting pregnancy, antepartum on their problem list.  Patient reports no complaints.   . Vag. Bleeding: None.  Movement: Present. Denies leaking of fluid.   The following portions of the patient's history were reviewed and updated as appropriate: allergies, current medications, past family history, past medical history, past social history, past surgical history and problem list.   Objective:   Vitals:   07/04/23 1437  BP: 116/78  Pulse: (!) 108  Weight: 183 lb (83 kg)    Fetal Status: Fetal Heart Rate (bpm): 146 Fundal Height: 29 cm Movement: Present     General:  Alert, oriented and cooperative. Patient is in no acute distress.  Skin: Skin is warm and dry. No rash noted.   Cardiovascular: Normal heart rate noted  Respiratory: Normal respiratory effort, no problems with respiration noted  Abdomen: Soft, gravid, appropriate for gestational age.  Pain/Pressure: Absent     Pelvic: Cervical exam deferred        Extremities: Normal range of motion.  Edema: None  Mental Status: Normal mood and affect. Normal behavior. Normal judgment and thought content.   Assessment and Plan:  Pregnancy: G2P1001 at [redacted]w[redacted]d 1. Supervision of high risk pregnancy, antepartum Continue routine prenatal care.  2. History of herpes genitalis On Valtrex  3. Previous cesarean delivery affecting pregnancy, antepartum R/B of TOLAC vs. RCS reviewed. Consent sigend  4. Shortness of breath due to pregnancy in third trimester Gets mildly tach with ambulation.  O2 sats dropped from 97-95 ? Palpitations as well - AMB Referral to Cardio Obstetrics  Preterm labor  symptoms and general obstetric precautions including but not limited to vaginal bleeding, contractions, leaking of fluid and fetal movement were reviewed in detail with the patient. Please refer to After Visit Summary for other counseling recommendations.   Return in 2 weeks (on 07/18/2023).  Future Appointments  Date Time Provider Department Center  07/16/2023  3:50 PM Federico Flake, MD [facility]  07/17/2023  8:20 AM Thomasene Ripple, DO [facility] None  07/30/2023  3:30 PM Federico Flake, MD [facility]  08/07/2023  3:30 PM [place] Bing, MD [facility]  08/14/2023  3:50 PM Milas Hock, MD [facility]  08/21/2023  3:50 PM Milas Hock, MD [facility]  08/28/2023  3:30 PM [place] Bing, MD [facility]    Reva Bores, MD

## 2023-07-04 NOTE — Progress Notes (Signed)
ROB: Denies any concerns  

## 2023-07-16 ENCOUNTER — Encounter: Payer: Self-pay | Admitting: Family Medicine

## 2023-07-16 ENCOUNTER — Telehealth (INDEPENDENT_AMBULATORY_CARE_PROVIDER_SITE_OTHER): Payer: Medicaid Other | Admitting: Family Medicine

## 2023-07-16 VITALS — BP 117/70 | HR 93

## 2023-07-16 DIAGNOSIS — O34219 Maternal care for unspecified type scar from previous cesarean delivery: Secondary | ICD-10-CM

## 2023-07-16 DIAGNOSIS — O0990 Supervision of high risk pregnancy, unspecified, unspecified trimester: Secondary | ICD-10-CM

## 2023-07-16 DIAGNOSIS — N200 Calculus of kidney: Secondary | ICD-10-CM

## 2023-07-16 DIAGNOSIS — Z8619 Personal history of other infectious and parasitic diseases: Secondary | ICD-10-CM

## 2023-07-16 NOTE — Progress Notes (Signed)
   OBSTETRICS PRENATAL VIRTUAL VISIT ENCOUNTER NOTE  Provider location: [facility] for [facility]   Patient location: Home  I connected with Judy Miller on 07/16/23 at  3:50 PM EDT by MyChart Video Encounter and verified that I am speaking with the correct person using two identifiers. I discussed the limitations, risks, security and privacy concerns of performing an evaluation and management service virtually and the availability of in person appointments. I also discussed with the patient that there may be a patient responsible charge related to this service. The patient expressed understanding and agreed to proceed. Subjective:  Judy Miller is a 27 y.o. G2P1001 at [redacted]w[redacted]d being seen today for ongoing prenatal care.  She is currently monitored for the following issues for this low-risk pregnancy and has Kidney stones; History of herpes genitalis; Supervision of high risk pregnancy, antepartum; and Previous cesarean delivery affecting pregnancy, antepartum on their problem list.  Patient reports  pelvic pressure and lightning crotch. Still feeling some SOB. Sees Cardio/OB tomorrow. .  Contractions: Not present. Vag. Bleeding: None.  Movement: Present. Denies any leaking of fluid.   The following portions of the patient's history were reviewed and updated as appropriate: allergies, current medications, past family history, past medical history, past social history, past surgical history and problem list.   Objective:   Vitals:   07/16/23 1600  BP: 117/70  Pulse: 93    Fetal Status:     Movement: Present     General:  Alert, oriented and cooperative. Patient is in no acute distress.  Respiratory: Normal respiratory effort, no problems with respiration noted  Mental Status: Normal mood and affect. Normal behavior. Normal judgment and thought content.  Rest of physical exam deferred due to type of encounter  Imaging: No results found.  Assessment  and Plan:  Pregnancy: G2P1001 at [redacted]w[redacted]d 1. Supervision of high risk pregnancy, antepartum Continue routine prenatal care.  2. Previous cesarean delivery affecting pregnancy, antepartum For TOLAC - consent signed  3. History of herpes genitalis On Valtrex  4. Kidney stones   Preterm labor symptoms and general obstetric precautions including but not limited to vaginal bleeding, contractions, leaking of fluid and fetal movement were reviewed in detail with the patient. I discussed the assessment and treatment plan with the patient. The patient was provided an opportunity to ask questions and all were answered. The patient agreed with the plan and demonstrated an understanding of the instructions. The patient was advised to call back or seek an in-person office evaluation/go to [facility]U at [facility] for any urgent or concerning symptoms. Please refer to After Visit Summary for other counseling recommendations.   I provided 5 minutes of face-to-face time during this encounter.  Return in 2 weeks (on 07/30/2023).  Future Appointments  Date Time Provider Department Center  07/17/2023  8:20 AM Thomasene Ripple, DO [facility] None  07/30/2023  3:30 PM Federico Flake, MD [facility]  08/07/2023  3:30 PM [place] Bing, MD [facility]  08/14/2023  3:50 PM Milas Hock, MD [facility]  08/21/2023  3:50 PM Milas Hock, MD [facility]  08/28/2023  3:30 PM [place] Bing, MD [facility]    Judy Bores, MD [facility] for [facility], [facility]

## 2023-07-17 ENCOUNTER — Encounter: Payer: Self-pay | Admitting: Cardiology

## 2023-07-17 ENCOUNTER — Ambulatory Visit (INDEPENDENT_AMBULATORY_CARE_PROVIDER_SITE_OTHER): Payer: Medicaid Other

## 2023-07-17 ENCOUNTER — Ambulatory Visit: Payer: Medicaid Other | Attending: Cardiology | Admitting: Cardiology

## 2023-07-17 ENCOUNTER — Ambulatory Visit (HOSPITAL_BASED_OUTPATIENT_CLINIC_OR_DEPARTMENT_OTHER): Payer: Medicaid Other

## 2023-07-17 VITALS — BP 126/66 | HR 103 | Ht 66.0 in | Wt 193.6 lb

## 2023-07-17 DIAGNOSIS — R002 Palpitations: Secondary | ICD-10-CM

## 2023-07-17 DIAGNOSIS — R0789 Other chest pain: Secondary | ICD-10-CM

## 2023-07-17 DIAGNOSIS — R0609 Other forms of dyspnea: Secondary | ICD-10-CM

## 2023-07-17 DIAGNOSIS — Z3A33 33 weeks gestation of pregnancy: Secondary | ICD-10-CM | POA: Diagnosis present

## 2023-07-17 DIAGNOSIS — Z136 Encounter for screening for cardiovascular disorders: Secondary | ICD-10-CM | POA: Insufficient documentation

## 2023-07-17 LAB — ECHOCARDIOGRAM COMPLETE
Area-P 1/2: 7.29 cm2
Height: 66 in
S' Lateral: 2.9 cm
Weight: 3097.6 oz

## 2023-07-17 NOTE — Progress Notes (Unsigned)
Cardio-[facility]stetrics Clinic  New Evaluation  Date:  07/18/2023   ID:  Judy Miller, DOB 1996-04-10, MRN [id number]  PCP:  Doreene Nest, NP   [facility] Providers Cardiologist:  Thomasene Ripple, DO  Electrophysiologist:  None       Referring MD: Reva Bores, MD   Chief Complaint: "   History of Present Illness:    Judy Miller is a 27 y.o. female [G2P1001] who is being seen today for the evaluation of worsening dyspnea on exertion and palpitations at the request of Reva Bores, MD.   Medical history includes gestational hypertension, anxiety, depression tells me that she has been experiencing intermittent palpitations.  She notes that has been off and on.  Abrupt onset of fast heartbeat.  Was notable is the fact that she has had this prior to pregnancy.  But with this she had gotten concerned because it is getting worse.  She does have family history of cardiomyopathy in her father does have heart failure, her grandmother and her uncle both has defibrillators from their cardiomyopathy.  She tells me the shortness of breath is worsening.  With this she is getting some chest discomfort.  Midsternal pressure-like sensation.   Prior CV Studies Reviewed: The following studies were reviewed today:   Past Medical History:  Diagnosis Date   Acne vulgaris 05/12/2015   ADHD (attention deficit hyperactivity disorder)    ADHD (attention deficit hyperactivity disorder)    Anxiety    Anxiety and depression 05/12/2015   Fluoxetine 10mg  did not help with symptom control. Venlafaxine 37.5mg  two po qday did not help much with symptoms control.   Arthritis    history of this in her knees   Depression    Depression, major, single episode, mild (HCC) 04/23/2013   Generalized anxiety disorder 11/06/2013   Generalized anxiety disorder 11/06/2013   Genital herpes 11/2017   pos HSV 2 culture   Gestational hypertension, third trimester 06/13/2021   History of  nephrolithiasis 03/07/2021   Postpartum anxiety 07/27/2021   Late July - started Zoloft 50mg    Renal stone 01/21/2019   Rhinitis, allergic 11/10/2015   S/P primary low transverse C-section 06/16/2021   Tachycardia 06/15/2021   Vaccine for human papilloma virus (HPV) types 6, 11, 16, and 18 administered     Past Surgical History:  Procedure Laterality Date   CESAREAN SECTION N/A 06/16/2021   Procedure: CESAREAN SECTION;  Surgeon: Federico Flake, MD;  Location: [facility] LD ORS;  Service: Obstetrics;  Laterality: N/A;      OB History     Gravida  2   Para  1   Term  1   Preterm  0   AB  0   Living  1      SAB  0   IAB  0   Ectopic  0   Multiple  0   Live Births  1               Current Medications: Current Meds  Medication Sig   aspirin EC 81 MG tablet Take 1 tablet (81 mg total) by mouth daily. Take after 12 weeks for prevention of preeclampsia later in pregnancy   hydrOXYzine (ATARAX) 25 MG tablet Take 1 tablet (25 mg total) by mouth every 6 (six) hours as needed for anxiety.   Prenatal Vit-Fe Fumarate-FA (MULTIVITAMIN-PRENATAL) 27-0.8 MG TABS tablet Take 1 tablet by mouth daily at 12 noon.   valACYclovir (VALTREX) 1000 MG tablet Take 1  tablet (1,000 mg total) by mouth daily.     Allergies:   Cod [fish allergy], Fish-derived products, Other, and Shellfish allergy   Social History   Socioeconomic History   Marital status: Single    Spouse name: Not on file   Number of children: Not on file   Years of education: Not on file   Highest education level: Not on file  Occupational History   Not on file  Tobacco Use   Smoking status: Every Day    Types: E-cigarettes   Smokeless tobacco: Never  Vaping Use   Vaping status: Every Day   Substances: Nicotine  Substance and Sexual Activity   Alcohol use: Not Currently    Comment: barely   Drug use: No    Comment: history of    Sexual activity: Yes    Birth control/protection: None  Other Topics  Concern   Not on file  Social History Narrative   Single.   Graduated from HS in 2017.    Student in cosmetology school.   Enjoys spending time outdoors, playing with her dog.   Social Determinants of Health   Financial Resource Strain: Not on file  Food Insecurity: No Food Insecurity (06/14/2021)   Hunger Vital Sign    Worried About Running Out of Food in the Last Year: Never true    Ran Out of Food in the Last Year: Never true  Transportation Needs: No Transportation Needs (06/14/2021)   PRAPARE - Administrator, Civil Service (Medical): No    Lack of Transportation (Non-Medical): No  Physical Activity: Not on file  Stress: Not on file  Social Connections: Not on file      Family History  Problem Relation Age of Onset   Arthritis Mother    Varicose Veins Maternal Grandmother    Heart disease Paternal Grandmother    Varicose Veins Paternal Grandmother       ROS:   Please see the history of present illness.    \ All other systems reviewed and are negative.   Labs/EKG Reviewed:    EKG:   EKG was ordered today.  The ekg ordered today demonstrates sinus tachycardia, heart rate 103 beats a minute.  Recent Labs: 02/22/2023: ALT 13 05/17/2023: BUN 6; Creatinine, Ser 0.47; Potassium 3.6; Sodium 136 06/06/2023: Hemoglobin 12.3; Platelets 244   Recent Lipid Panel No results found for: "CHOL", "TRIG", "HDL", "CHOLHDL", "LDLCALC", "LDLDIRECT"  Physical Exam:    VS:  BP 126/66 (BP Location: Right Arm)   Pulse (!) 103   Ht 5\' 6"  (1.676 m)   Wt 193 lb 9.6 oz (87.8 kg)   LMP 12/04/2022   SpO2 98%   BMI 31.25 kg/m     Wt Readings from Last 3 Encounters:  07/17/23 193 lb 9.6 oz (87.8 kg)  07/04/23 183 lb (83 kg)  06/20/23 185 lb (83.9 kg)     GEN:  Well nourished, well developed in no acute distress HEENT: Normal NECK: No JVD; No carotid bruits LYMPHATICS: No lymphadenopathy CARDIAC: RRR, no murmurs, rubs, gallops RESPIRATORY:  Clear to auscultation  without rales, wheezing or rhonchi  ABDOMEN: Soft, non-tender, non-distended MUSCULOSKELETAL:  No edema; No deformity  SKIN: Warm and dry NEUROLOGIC:  Alert and oriented x 3 PSYCHIATRIC:  Normal affect    Risk Assessment/Risk Calculators:     CARPREG II Risk Prediction Index Score:  1.  The patient's risk for a primary cardiac event is 5%.  ASSESSMENT & PLAN:    Dyspnea on exertion Atypical chest pain Palpitation   I would like to rule out a cardiovascular etiology of this palpitation, therefore at this time I would like to placed a zio patch for 7  days. In additon to the atypical chest pain a transthoracic echocardiogram will be ordered to assess LV/RV function and any structural abnormalities. Once these testing have been performed amd reviewed further reccomendations will be made. For now, I do reccomend that the patient goes to the nearest ED if  symptoms recur.  The patient is in agreement with the above plan. The patient left the office in stable condition.  The patient will follow up in 12 weeks postpartum. Patient Instructions  Medication Instructions:  Your physician recommends that you continue on your current medications as directed. Please refer to the Current Medication list given to you today.  *If you need a refill on your cardiac medications before your next appointment, please call your pharmacy*   Lab Work: None   Testing/Procedures: Your physician has requested that you have an echocardiogram - OB - urgent. Echocardiography is a painless test that uses sound waves to create images of your heart. It provides your doctor with information about the size and shape of your heart and how well your heart's chambers and valves are working. This procedure takes approximately one hour. There are no restrictions for this procedure. Please do NOT wear cologne, perfume, aftershave, or lotions (deodorant is allowed). Please arrive 15 minutes prior to your  appointment time.    Follow-Up: At [facility], you and your health needs are our priority.  As part of our continuing mission to provide you with exceptional heart care, we have created designated Provider Care Teams.  These Care Teams include your primary Cardiologist (physician) and Advanced Practice Providers (APPs -  Physician Assistants and Nurse Practitioners) who all work together to provide you with the care you need, when you need it.  ZIO XT- Long Term Monitor Instructions  Your physician has requested you wear a ZIO patch monitor for 7 days.  This is a single patch monitor. [employer] supplies one patch monitor per enrollment. Additional stickers are not available. Please do not apply patch if you will be having a Nuclear Stress Test,  Echocardiogram, Cardiac CT, MRI, or Chest Xray during the period you would be wearing the  monitor. The patch cannot be worn during these tests. You cannot remove and re-apply the  ZIO XT patch monitor.  Your ZIO patch monitor will be mailed 3 day USPS to your address on file. It may take 3-5 days  to receive your monitor after you have been enrolled.  Once you have received your monitor, please review the enclosed instructions. Your monitor  has already been registered assigning a specific monitor serial # to you.  Billing and Patient Assistance Program Information  We have supplied [employer] with any of your insurance information on file for billing purposes. [employer] offers a sliding scale Patient Assistance Program for patients that do not have  insurance, or whose insurance does not completely cover the cost of the ZIO monitor.  You must apply for the Patient Assistance Program to qualify for this discounted rate.  To apply, please call [employer] at [phone], select option 4, select option 2, ask to apply for  Patient Assistance Program. Meredeth Ide will ask your household income, and how many people  are in your household. They will  quote your out-of-pocket cost based on  that information.  [employer] will also be able to set up a 83-month, interest-free payment plan if needed.  Applying the monitor   Shave hair from upper left chest.  Hold abrader disc by orange tab. Rub abrader in 40 strokes over the upper left chest as  indicated in your monitor instructions.  Clean area with 4 enclosed alcohol pads. Let dry.  Apply patch as indicated in monitor instructions. Patch will be placed under collarbone on left  side of chest with arrow pointing upward.  Rub patch adhesive wings for 2 minutes. Remove white label marked "1". Remove the white  label marked "2". Rub patch adhesive wings for 2 additional minutes.  While looking in a mirror, press and release button in center of patch. A small green light will  flash 3-4 times. This will be your only indicator that the monitor has been turned on.  Do not shower for the first 24 hours. You may shower after the first 24 hours.  Press the button if you feel a symptom. You will hear a small click. Record Date, Time and  Symptom in the Patient Logbook.  When you are ready to remove the patch, follow instructions on the last 2 pages of Patient  Logbook. Stick patch monitor onto the last page of Patient Logbook.  Place Patient Logbook in the blue and white box. Use locking tab on box and tape box closed  securely. The blue and white box has prepaid postage on it. Please place it in the mailbox as  soon as possible. Your physician should have your test results approximately 7 days after the  monitor has been mailed back to [facility]  Call [facility] Customer Care at [phone] if you have questions regarding  your ZIO XT patch monitor. Call them immediately if you see an orange light blinking on your  monitor.  If your monitor falls off in less than 4 days, contact our Monitor department at [phone].  If your monitor becomes loose or falls off after 4 days call [employer]  at [phone] for  suggestions on securing your monitor   Your next appointment:   12 week(s)  Provider:   Thomasene Ripple, DO    Dispo:  No follow-ups on file.   Medication Adjustments/Labs and Tests Ordered: Current medicines are reviewed at length with the patient today.  Concerns regarding medicines are outlined above.  Tests Ordered: Orders Placed This Encounter  Procedures   LONG TERM MONITOR (3-14 DAYS)   EKG 12-Lead   ECHOCARDIOGRAM COMPLETE   Medication Changes: No orders of the defined types were placed in this encounter.

## 2023-07-17 NOTE — Progress Notes (Unsigned)
Enrolled patient for a 7 day Zio XT monitor to be mailed to patients home.  

## 2023-07-17 NOTE — Patient Instructions (Signed)
Medication Instructions:  Your physician recommends that you continue on your current medications as directed. Please refer to the Current Medication list given to you today.  *If you need a refill on your cardiac medications before your next appointment, please call your pharmacy*   Lab Work: None   Testing/Procedures: Your physician has requested that you have an echocardiogram - OB - urgent. Echocardiography is a painless test that uses sound waves to create images of your heart. It provides your doctor with information about the size and shape of your heart and how well your heart's chambers and valves are working. This procedure takes approximately one hour. There are no restrictions for this procedure. Please do NOT wear cologne, perfume, aftershave, or lotions (deodorant is allowed). Please arrive 15 minutes prior to your appointment time.    Follow-Up: At [facility], you and your health needs are our priority.  As part of our continuing mission to provide you with exceptional heart care, we have created designated Provider Care Teams.  These Care Teams include your primary Cardiologist (physician) and Advanced Practice Providers (APPs -  Physician Assistants and Nurse Practitioners) who all work together to provide you with the care you need, when you need it.  ZIO XT- Long Term Monitor Instructions  Your physician has requested you wear a ZIO patch monitor for 7 days.  This is a single patch monitor. [employer] supplies one patch monitor per enrollment. Additional stickers are not available. Please do not apply patch if you will be having a Nuclear Stress Test,  Echocardiogram, Cardiac CT, MRI, or Chest Xray during the period you would be wearing the  monitor. The patch cannot be worn during these tests. You cannot remove and re-apply the  ZIO XT patch monitor.  Your ZIO patch monitor will be mailed 3 day USPS to your address on file. It may take 3-5 days  to receive your  monitor after you have been enrolled.  Once you have received your monitor, please review the enclosed instructions. Your monitor  has already been registered assigning a specific monitor serial # to you.  Billing and Patient Assistance Program Information  We have supplied [employer] with any of your insurance information on file for billing purposes. [employer] offers a sliding scale Patient Assistance Program for patients that do not have  insurance, or whose insurance does not completely cover the cost of the ZIO monitor.  You must apply for the Patient Assistance Program to qualify for this discounted rate.  To apply, please call [employer] at [phone], select option 4, select option 2, ask to apply for  Patient Assistance Program. Meredeth Ide will ask your household income, and how many people  are in your household. They will quote your out-of-pocket cost based on that information.  [employer] will also be able to set up a 65-month, interest-free payment plan if needed.  Applying the monitor   Shave hair from upper left chest.  Hold abrader disc by orange tab. Rub abrader in 40 strokes over the upper left chest as  indicated in your monitor instructions.  Clean area with 4 enclosed alcohol pads. Let dry.  Apply patch as indicated in monitor instructions. Patch will be placed under collarbone on left  side of chest with arrow pointing upward.  Rub patch adhesive wings for 2 minutes. Remove white label marked "1". Remove the white  label marked "2". Rub patch adhesive wings for 2 additional minutes.  While looking in a mirror, press and release button  in center of patch. A small green light will  flash 3-4 times. This will be your only indicator that the monitor has been turned on.  Do not shower for the first 24 hours. You may shower after the first 24 hours.  Press the button if you feel a symptom. You will hear a small click. Record Date, Time and  Symptom in the Patient Logbook.  When you  are ready to remove the patch, follow instructions on the last 2 pages of Patient  Logbook. Stick patch monitor onto the last page of Patient Logbook.  Place Patient Logbook in the blue and white box. Use locking tab on box and tape box closed  securely. The blue and white box has prepaid postage on it. Please place it in the mailbox as  soon as possible. Your physician should have your test results approximately 7 days after the  monitor has been mailed back to [facility].  Call [facility] Customer Care at [phone] if you have questions regarding  your ZIO XT patch monitor. Call them immediately if you see an orange light blinking on your  monitor.  If your monitor falls off in less than 4 days, contact our Monitor department at [phone].  If your monitor becomes loose or falls off after 4 days call [employer] at [phone] for  suggestions on securing your monitor   Your next appointment:   12 week(s)  Provider:   Thomasene Ripple, DO

## 2023-07-19 ENCOUNTER — Other Ambulatory Visit (HOSPITAL_COMMUNITY): Payer: Medicaid Other

## 2023-07-30 ENCOUNTER — Other Ambulatory Visit (HOSPITAL_COMMUNITY)
Admission: RE | Admit: 2023-07-30 | Discharge: 2023-07-30 | Disposition: A | Discharge status: Home / Self Care | Payer: Medicaid Other | Source: Ambulatory Visit | Attending: Family Medicine | Admitting: Family Medicine

## 2023-07-30 ENCOUNTER — Ambulatory Visit: Payer: Medicaid Other | Admitting: Family Medicine

## 2023-07-30 VITALS — BP 114/75 | HR 99 | Wt 194.0 lb

## 2023-07-30 DIAGNOSIS — O0990 Supervision of high risk pregnancy, unspecified, unspecified trimester: Secondary | ICD-10-CM | POA: Diagnosis present

## 2023-07-30 DIAGNOSIS — O321XX0 Maternal care for breech presentation, not applicable or unspecified: Secondary | ICD-10-CM

## 2023-07-30 DIAGNOSIS — Z1339 Encounter for screening examination for other mental health and behavioral disorders: Secondary | ICD-10-CM | POA: Diagnosis present

## 2023-07-30 DIAGNOSIS — B3731 Acute candidiasis of vulva and vagina: Secondary | ICD-10-CM

## 2023-07-30 DIAGNOSIS — Z8619 Personal history of other infectious and parasitic diseases: Secondary | ICD-10-CM

## 2023-07-30 DIAGNOSIS — R002 Palpitations: Secondary | ICD-10-CM

## 2023-07-30 DIAGNOSIS — O34219 Maternal care for unspecified type scar from previous cesarean delivery: Secondary | ICD-10-CM

## 2023-07-30 DIAGNOSIS — Z3A35 35 weeks gestation of pregnancy: Secondary | ICD-10-CM

## 2023-07-30 DIAGNOSIS — O0993 Supervision of high risk pregnancy, unspecified, third trimester: Secondary | ICD-10-CM

## 2023-07-30 NOTE — Progress Notes (Signed)
   PRENATAL VISIT NOTE  Subjective:  Judy Miller is a 27 y.o. G2P1001 at [redacted]w[redacted]d being seen today for ongoing prenatal care.  She is currently monitored for the following issues for this high-risk pregnancy and has Kidney stones; History of herpes genitalis; Supervision of high risk pregnancy, antepartum; and Previous cesarean delivery affecting pregnancy, antepartum on their problem list.  Patient reports no complaints.  Contractions: Not present.  .  Movement: Present. Denies leaking of fluid.   The following portions of the patient's history were reviewed and updated as appropriate: allergies, current medications, past family history, past medical history, past social history, past surgical history and problem list.   Objective:   Vitals:   07/30/23 1544  BP: 114/75  Pulse: 99  Weight: 194 lb (88 kg)    Fetal Status: Fetal Heart Rate (bpm): 138 Fundal Height: 34 cm Movement: Present  Presentation: Complete Breech  General:  Alert, oriented and cooperative. Patient is in no acute distress.  Skin: Skin is warm and dry. No rash noted.   Cardiovascular: Normal heart rate noted  Respiratory: Normal respiratory effort, no problems with respiration noted  Abdomen: Soft, gravid, appropriate for gestational age.  Pain/Pressure: Present     Pelvic: Cervical exam performed in the presence of a chaperone Dilation: Closed Effacement (%): Thick    Extremities: Normal range of motion.     Mental Status: Normal mood and affect. Normal behavior. Normal judgment and thought content.   Assessment and Plan:  Pregnancy: G2P1001 at [redacted]w[redacted]d 1. Encounter for screening examination for other mental health and behavioral disorders [Z13.39] - Cervicovaginal ancillary only - Strep Gp B NAA  2. Supervision of high risk pregnancy, antepartum Up to date Having vaginal irritation with dryness and itching. Swab for BV/yeast today  - Cervicovaginal ancillary only - Strep Gp B NAA  3. Previous  cesarean delivery affecting pregnancy, antepartum Desires TOLAC  4. History of herpes genitalis On PPX and no recent outbreaks  5. Breech presentation, single or unspecified fetus Confirmed on [place] Discussed spinning babies, moxibustion, chiropractic manipulation Discussed ECV if baby doe snto become vertex by 36 week and timing of ECV at 37+ weeks.  If ECV not successful reviewed repeat CS at 39 wks with possible attempt at ECV with regional anesthesia.   Preterm labor symptoms and general obstetric precautions including but not limited to vaginal bleeding, contractions, leaking of fluid and fetal movement were reviewed in detail with the patient. Please refer to After Visit Summary for other counseling recommendations.   Return in about 1 week (around 08/06/2023) for Routine prenatal care.  Future Appointments  Date Time Provider Department Center  08/07/2023  3:30 PM Flowella Bing, MD [facility]  08/14/2023  3:50 PM Milas Hock, MD [facility]  08/21/2023  3:50 PM Milas Hock, MD [facility]  08/28/2023  3:30 PM Whitecone Bing, MD [facility]  10/03/2023  3:00 PM Thomasene Ripple, DO [facility] None    Federico Flake, MD

## 2023-07-30 NOTE — Patient Instructions (Signed)
[place] ? ?[facility] ?[address] [place], [place] [address] ?([phone] ? ?[email or web address] ? ?

## 2023-07-30 NOTE — Progress Notes (Signed)
Here today, back and pelvic pain, baby moving good, having some vaginal itching

## 2023-08-01 LAB — STREP GP B NAA: Strep Gp B NAA: NEGATIVE

## 2023-08-01 LAB — CERVICOVAGINAL ANCILLARY ONLY
Bacterial Vaginitis (gardnerella): NEGATIVE
Candida Glabrata: NEGATIVE
Candida Vaginitis: POSITIVE — AB
Chlamydia: NEGATIVE
Comment: NEGATIVE
Comment: NEGATIVE
Comment: NEGATIVE
Comment: NEGATIVE
Comment: NORMAL
Neisseria Gonorrhea: NEGATIVE

## 2023-08-01 MED ORDER — FLUCONAZOLE 150 MG PO TABS
150.0000 mg | ORAL_TABLET | Freq: Once | ORAL | 3 refills | Status: AC
Start: 2023-08-01 — End: 2023-08-01

## 2023-08-01 NOTE — Addendum Note (Signed)
Addended by: Geanie Berlin on: 08/01/2023 05:22 PM   Modules accepted: Orders

## 2023-08-04 ENCOUNTER — Inpatient Hospital Stay (HOSPITAL_COMMUNITY)
Admission: AD | Admit: 2023-08-04 | Discharge: 2023-08-05 | Disposition: A | Discharge status: Home / Self Care | Payer: Medicaid Other | Attending: Obstetrics and Gynecology | Admitting: Obstetrics and Gynecology

## 2023-08-04 ENCOUNTER — Encounter (HOSPITAL_COMMUNITY): Payer: Self-pay | Admitting: Obstetrics and Gynecology

## 2023-08-04 DIAGNOSIS — Z3A36 36 weeks gestation of pregnancy: Secondary | ICD-10-CM | POA: Insufficient documentation

## 2023-08-04 DIAGNOSIS — W1830XA Fall on same level, unspecified, initial encounter: Secondary | ICD-10-CM | POA: Insufficient documentation

## 2023-08-04 DIAGNOSIS — N858 Other specified noninflammatory disorders of uterus: Secondary | ICD-10-CM

## 2023-08-04 DIAGNOSIS — O4703 False labor before 37 completed weeks of gestation, third trimester: Secondary | ICD-10-CM | POA: Insufficient documentation

## 2023-08-04 DIAGNOSIS — O0990 Supervision of high risk pregnancy, unspecified, unspecified trimester: Secondary | ICD-10-CM

## 2023-08-04 DIAGNOSIS — W19XXXA Unspecified fall, initial encounter: Secondary | ICD-10-CM

## 2023-08-04 DIAGNOSIS — Y9301 Activity, walking, marching and hiking: Secondary | ICD-10-CM | POA: Insufficient documentation

## 2023-08-04 DIAGNOSIS — O34219 Maternal care for unspecified type scar from previous cesarean delivery: Secondary | ICD-10-CM

## 2023-08-04 NOTE — MAU Provider Note (Signed)
Chief Complaint:  Fall   Event Date/Time   First Provider Initiated Contact with Patient 08/04/23 2101     HPI: Judy Miller is a 27 y.o. G2P1001 at 41w2dwho presents to maternity admissions reporting having fallen in her yard.  Landed on her knees then over to her buttocks.  Did not strike abdomen.  Feels good fetal movement.  Has been feeling painless contractions. . She reports good fetal movement, denies LOF, vaginal bleeding, n/v,   Fall The accident occurred 1 to 3 hours ago. The fall occurred while walking. She landed on Grass. There was no blood loss. The point of impact was the buttocks, left knee and right knee. Pertinent negatives include no abdominal pain or fever. She has tried nothing for the symptoms.   RN Note: .Judy Miller is a 27 y.o. at [redacted]w[redacted]d here in MAU reporting: around 7:30 pm she was walking in yard and twisted her ankle and fell to her knees then her bottom. Did not fall onto abd. Reports  some pelvic pressure.but not ctx.  Denies any vag bleeding or discharge at this time. Reports fetal movement since fall.  LMP:  Onset of complaint: 1930 Pain score: 2   Past Medical History: Past Medical History:  Diagnosis Date   Acne vulgaris 05/12/2015   ADHD (attention deficit hyperactivity disorder)    ADHD (attention deficit hyperactivity disorder)    Anxiety    Anxiety and depression 05/12/2015   Fluoxetine 10mg  did not help with symptom control. Venlafaxine 37.5mg  two po qday did not help much with symptoms control.   Arthritis    history of this in her knees   Depression    Depression, major, single episode, mild (HCC) 04/23/2013   Generalized anxiety disorder 11/06/2013   Generalized anxiety disorder 11/06/2013   Genital herpes 11/2017   pos HSV 2 culture   Gestational hypertension, third trimester 06/13/2021   History of nephrolithiasis 03/07/2021   Postpartum anxiety 07/27/2021   Late July - started Zoloft 50mg    Renal stone 01/21/2019    Rhinitis, allergic 11/10/2015   S/P primary low transverse C-section 06/16/2021   Tachycardia 06/15/2021   Vaccine for human papilloma virus (HPV) types 6, 11, 16, and 18 administered     Past obstetric history: OB History  Gravida Para Term Preterm AB Living  2 1 1  0 0 1  SAB IAB Ectopic Multiple Live Births  0 0 0 0 1    # Outcome Date GA Lbr Len/2nd Weight Sex Type Anes PTL Lv  2 Current           1 Term 06/16/21 [redacted]w[redacted]d  3070 g F CS-LTranv Spinal  LIV    Past Surgical History: Past Surgical History:  Procedure Laterality Date   CESAREAN SECTION N/A 06/16/2021   Procedure: CESAREAN SECTION;  Surgeon: Federico Flake, MD;  Location: [facility];  Service: Obstetrics;  Laterality: N/A;    Family History: Family History  Problem Relation Age of Onset   Arthritis Mother    Varicose Veins Maternal Grandmother    Heart disease Paternal Grandmother    Varicose Veins Paternal Grandmother     Social History: Social History   Tobacco Use   Smoking status: Every Day    Types: E-cigarettes   Smokeless tobacco: Never  Vaping Use   Vaping status: Every Day   Substances: Nicotine  Substance Use Topics   Alcohol use: Not Currently    Comment: barely   Drug use: No  Comment: history of     Allergies:  Allergies  Allergen Reactions   Cod [Fish Allergy] Hives    Grouper fish   Fish-Derived Products Hives    Grouper fish   Other Hives and Other (See Comments)    Red hair dye Crab legs Grouper fish   Shellfish Allergy Other (See Comments)    Crab legs     Meds:  Medications Prior to Admission  Medication Sig Dispense Refill Last Dose   aspirin EC 81 MG tablet Take 1 tablet (81 mg total) by mouth daily. Take after 12 weeks for prevention of preeclampsia later in pregnancy 300 tablet 2 08/04/2023   hydrOXYzine (ATARAX) 25 MG tablet Take 1 tablet (25 mg total) by mouth every 6 (six) hours as needed for anxiety. 30 tablet 2 Past Month   Prenatal Vit-Fe Fumarate-FA  (MULTIVITAMIN-PRENATAL) 27-0.8 MG TABS tablet Take 1 tablet by mouth daily at 12 noon.   08/04/2023   valACYclovir (VALTREX) 1000 MG tablet Take 1 tablet (1,000 mg total) by mouth daily. 30 tablet 2 08/04/2023   busPIRone (BUSPAR) 10 MG tablet Take 1 tablet (10 mg total) by mouth 2 (two) times daily. (Patient not taking: Reported on 07/17/2023) 60 tablet 2     I have reviewed patient's Past Medical Hx, Surgical Hx, Family Hx, Social Hx, medications and allergies.   ROS:  Review of Systems  Constitutional:  Negative for fever.  Cardiovascular:  Negative for leg swelling.  Gastrointestinal:  Negative for abdominal pain.  Musculoskeletal:  Negative for back pain.       Abrasion on knee    Other systems negative  Physical Exam  Patient Vitals for the past 24 hrs:  BP Temp Pulse Resp Height Weight  08/04/23 2056 122/70 98.3 F (36.8 C) 96 18 5\' 6"  (1.676 m) 87.5 kg   Constitutional: Well-developed, well-nourished female in no acute distress.  Cardiovascular: normal rate  Respiratory: normal effort GI: Abd soft, non-tender, gravid appropriate for gestational age.   No rebound or guarding. MS: Extremities nontender, no edema, normal ROM Neurologic: Alert and oriented x 4.  GU: Neg CVAT.  FHT:  Baseline 140 , moderate variability, accelerations present, no decelerations Contractions: Irregular, most not felt by patient   Labs: No results found for this or any previous visit (from the past 24 hour(s)). B/Positive/-- (03/21 1547)  Imaging:    MAU Course/MDM: I have reviewed the triage vital signs and the nursing notes.   Pertinent labs & imaging results that were available during my care of the patient were reviewed by me and considered in my medical decision making (see chart for details).      I have reviewed her medical records including past results, notes and treatments.    NST reviewed, reactive throughout.  We monitored her for 3-4 hours and there was no abdominal tenderness  or painful contractions.  She does have intermittent Braxton Hicks contractions. Consult Dr Donavan Foil with presentation, exam findings and test results.   Assessment: Single IUP at [redacted]w[redacted]d Status post fall onto buttocks Good fetal movement Reactive fetal heart rate tracing  Plan: Discharge home Labor precautions and fetal kick counts Follow up in Office for prenatal visits and recheck  Pt stable at time of discharge.  Wynelle Bourgeois CNM, MSN Certified Nurse-Midwife 08/04/2023 9:01 PM

## 2023-08-04 NOTE — MAU Note (Signed)
.  Judy Miller is a 27 y.o. at [redacted]w[redacted]d here in MAU reporting: around 7:30 pm she was walking in yard and twisted her ankle and fell to her knees then her bottom. Did not fall onto abd. Reports  some pelvic pressure.but not ctx.  Denies any vag bleeding or discharge at this time. Reports fetal movement since fall.  LMP:  Onset of complaint: 1930 Pain score: 2 Vitals:   08/04/23 2056  BP: 122/70  Pulse: 96  Resp: 18  Temp: 98.3 F (36.8 C)     FHT:183 Lab orders placed from triage:

## 2023-08-05 DIAGNOSIS — O26893 Other specified pregnancy related conditions, third trimester: Secondary | ICD-10-CM

## 2023-08-05 DIAGNOSIS — Z3A36 36 weeks gestation of pregnancy: Secondary | ICD-10-CM

## 2023-08-05 DIAGNOSIS — O4703 False labor before 37 completed weeks of gestation, third trimester: Secondary | ICD-10-CM | POA: Diagnosis present

## 2023-08-05 DIAGNOSIS — W19XXXA Unspecified fall, initial encounter: Secondary | ICD-10-CM | POA: Diagnosis not present

## 2023-08-05 DIAGNOSIS — N858 Other specified noninflammatory disorders of uterus: Secondary | ICD-10-CM | POA: Diagnosis not present

## 2023-08-05 DIAGNOSIS — Y9301 Activity, walking, marching and hiking: Secondary | ICD-10-CM | POA: Diagnosis not present

## 2023-08-05 DIAGNOSIS — W1830XA Fall on same level, unspecified, initial encounter: Secondary | ICD-10-CM | POA: Diagnosis not present

## 2023-08-07 ENCOUNTER — Ambulatory Visit (INDEPENDENT_AMBULATORY_CARE_PROVIDER_SITE_OTHER): Payer: Medicaid Other | Admitting: Obstetrics and Gynecology

## 2023-08-07 ENCOUNTER — Encounter: Payer: Self-pay | Admitting: *Deleted

## 2023-08-07 VITALS — BP 122/81 | HR 92 | Wt 195.8 lb

## 2023-08-07 DIAGNOSIS — Z8759 Personal history of other complications of pregnancy, childbirth and the puerperium: Secondary | ICD-10-CM | POA: Insufficient documentation

## 2023-08-07 DIAGNOSIS — O329XX0 Maternal care for malpresentation of fetus, unspecified, not applicable or unspecified: Secondary | ICD-10-CM | POA: Insufficient documentation

## 2023-08-07 DIAGNOSIS — Z3A36 36 weeks gestation of pregnancy: Secondary | ICD-10-CM

## 2023-08-07 DIAGNOSIS — Z8619 Personal history of other infectious and parasitic diseases: Secondary | ICD-10-CM

## 2023-08-07 DIAGNOSIS — O34219 Maternal care for unspecified type scar from previous cesarean delivery: Secondary | ICD-10-CM

## 2023-08-07 NOTE — Progress Notes (Signed)
ROB: Wanting to check presentation to see if baby is still breech

## 2023-08-07 NOTE — Progress Notes (Signed)
   PRENATAL VISIT NOTE  Subjective:  Judy Miller is a 27 y.o. G2P1001 at [redacted]w[redacted]d being seen today for ongoing prenatal care.  She is currently monitored for the following issues for this high-risk pregnancy and has Kidney stones; History of herpes genitalis; Supervision of high risk pregnancy, antepartum; Previous cesarean delivery affecting pregnancy, antepartum; and Malpresentation before onset of labor on their problem list.  Patient reports no complaints.  Contractions: Irregular. Vag. Bleeding: None.  Movement: Present. Denies leaking of fluid.   The following portions of the patient's history were reviewed and updated as appropriate: allergies, current medications, past family history, past medical history, past social history, past surgical history and problem list.   Objective:   Vitals:   08/07/23 1539  BP: 122/81  Pulse: 92  Weight: 195 lb 12.8 oz (88.8 kg)    Fetal Status: Fetal Heart Rate (bpm): 143 Fundal Height: 37 cm Movement: Present  Presentation: Complete Breech  General:  Alert, oriented and cooperative. Patient is in no acute distress.  Skin: Skin is warm and dry. No rash noted.   Cardiovascular: Normal heart rate noted  Respiratory: Normal respiratory effort, no problems with respiration noted  Abdomen: Soft, gravid, appropriate for gestational age.  Pain/Pressure: Present     Pelvic: Cervical exam deferred        Extremities: Normal range of motion.  Edema: None  Mental Status: Normal mood and affect. Normal behavior. Normal judgment and thought content.   Assessment and Plan:  Pregnancy: G2P1001 at [redacted]w[redacted]d 1. Malpresentation before onset of labor, single or unspecified fetus Still breech (complete) on bedside u/s today and with subjectively normal AF and incidental fetal breathing noted along with good fetal movements. Head is at 10 o'clock, with spine on maternal rightand mom sometimes baby's head goes from there to 12 o'clock.   I d/w her re: ECV and  risk of abruption, non reassuring fetal heart tones, need for emergency c-setion, etc d/w her and she would like to do an ECV. She would also like to do it at 37w with potential risk of going back to breech if successful ECV; pt does not want regional anesthesia.  Patient scheduled for Thursday ECV  2. Previous cesarean delivery affecting pregnancy, antepartum See above. S/p 06/2021 PLTCS for hsv outbreak  3. History of herpes genitalis Confirms on valtrex  4. History of gestational hypertension No issues.   5. Pregnancy GBS neg  Preterm labor symptoms and general obstetric precautions including but not limited to vaginal bleeding, contractions, leaking of fluid and fetal movement were reviewed in detail with the patient. Please refer to After Visit Summary for other counseling recommendations.   No follow-ups on file.  Future Appointments  Date Time Provider Department Center  08/14/2023  3:50 PM Milas Hock, MD [facility]  08/21/2023  3:50 PM Milas Hock, MD [facility]  08/28/2023  3:30 PM [place] Bing, MD [facility]  10/03/2023  3:00 PM Tobb, Lavona Mound, DO [facility] None    [place] Bing, MD

## 2023-08-08 ENCOUNTER — Other Ambulatory Visit: Payer: Self-pay | Admitting: Advanced Practice Midwife

## 2023-08-08 ENCOUNTER — Telehealth (HOSPITAL_COMMUNITY): Payer: Self-pay | Admitting: *Deleted

## 2023-08-08 DIAGNOSIS — O321XX0 Maternal care for breech presentation, not applicable or unspecified: Secondary | ICD-10-CM

## 2023-08-08 NOTE — Telephone Encounter (Signed)
Preadmission screen  

## 2023-08-09 ENCOUNTER — Encounter (HOSPITAL_COMMUNITY): Payer: Self-pay | Admitting: Family Medicine

## 2023-08-09 ENCOUNTER — Other Ambulatory Visit: Payer: Self-pay

## 2023-08-09 ENCOUNTER — Encounter (HOSPITAL_COMMUNITY): Payer: Self-pay | Admitting: Anesthesiology

## 2023-08-09 ENCOUNTER — Observation Stay (HOSPITAL_COMMUNITY)
Admission: AD | Admit: 2023-08-09 | Discharge: 2023-08-09 | Disposition: A | Discharge status: Home / Self Care | Payer: Medicaid Other | Attending: Family Medicine | Admitting: Family Medicine

## 2023-08-09 ENCOUNTER — Observation Stay (HOSPITAL_COMMUNITY): Payer: Medicaid Other

## 2023-08-09 DIAGNOSIS — O34219 Maternal care for unspecified type scar from previous cesarean delivery: Secondary | ICD-10-CM

## 2023-08-09 DIAGNOSIS — F17290 Nicotine dependence, other tobacco product, uncomplicated: Secondary | ICD-10-CM | POA: Diagnosis not present

## 2023-08-09 DIAGNOSIS — O99333 Smoking (tobacco) complicating pregnancy, third trimester: Secondary | ICD-10-CM | POA: Diagnosis not present

## 2023-08-09 DIAGNOSIS — Z3A37 37 weeks gestation of pregnancy: Secondary | ICD-10-CM | POA: Insufficient documentation

## 2023-08-09 DIAGNOSIS — O321XX0 Maternal care for breech presentation, not applicable or unspecified: Secondary | ICD-10-CM | POA: Diagnosis present

## 2023-08-09 DIAGNOSIS — Z79899 Other long term (current) drug therapy: Secondary | ICD-10-CM | POA: Diagnosis not present

## 2023-08-09 DIAGNOSIS — Z8619 Personal history of other infectious and parasitic diseases: Secondary | ICD-10-CM

## 2023-08-09 DIAGNOSIS — O0990 Supervision of high risk pregnancy, unspecified, unspecified trimester: Secondary | ICD-10-CM

## 2023-08-09 DIAGNOSIS — Z3493 Encounter for supervision of normal pregnancy, unspecified, third trimester: Principal | ICD-10-CM

## 2023-08-09 DIAGNOSIS — Z7982 Long term (current) use of aspirin: Secondary | ICD-10-CM | POA: Insufficient documentation

## 2023-08-09 DIAGNOSIS — Z8759 Personal history of other complications of pregnancy, childbirth and the puerperium: Secondary | ICD-10-CM

## 2023-08-09 DIAGNOSIS — O329XX0 Maternal care for malpresentation of fetus, unspecified, not applicable or unspecified: Secondary | ICD-10-CM

## 2023-08-09 LAB — CBC
HCT: 35.8 % — ABNORMAL LOW (ref 36.0–46.0)
Hemoglobin: 11.5 g/dL — ABNORMAL LOW (ref 12.0–15.0)
MCH: 26.1 pg (ref 26.0–34.0)
MCHC: 32.1 g/dL (ref 30.0–36.0)
MCV: 81.4 fL (ref 80.0–100.0)
Platelets: 211 10*3/uL (ref 150–400)
RBC: 4.4 MIL/uL (ref 3.87–5.11)
RDW: 13.8 % (ref 11.5–15.5)
WBC: 10.9 10*3/uL — ABNORMAL HIGH (ref 4.0–10.5)
nRBC: 0 % (ref 0.0–0.2)

## 2023-08-09 LAB — TYPE AND SCREEN
ABO/RH(D): B POS
Antibody Screen: NEGATIVE

## 2023-08-09 MED ORDER — LACTATED RINGERS IV SOLN: INTRAVENOUS | Status: DC

## 2023-08-09 MED ORDER — TERBUTALINE SULFATE 1 MG/ML IJ SOLN
INTRAMUSCULAR | Status: AC
  Filled 2023-08-09: qty 1

## 2023-08-09 MED ORDER — TERBUTALINE SULFATE 1 MG/ML IJ SOLN
0.2500 mg | Freq: Once | INTRAMUSCULAR | Status: AC
  Administered 2023-08-09: 0.25 mg via SUBCUTANEOUS

## 2023-08-09 NOTE — H&P (Addendum)
OBSTETRIC ADMISSION HISTORY AND PHYSICAL  Judy Miller is a 27 y.o. female G2P1001 with IUP at 102w0d by 13 wk [place] presenting for ECV. She reports +FMs, No LOF, no VB, no blurry vision, headaches or peripheral edema, and RUQ pain.  She plans on breast feeding.  She [facility] her [facility]  Dating: By [place] @13  --->  Estimated Date of Delivery: 08/30/23  Prenatal History/Complications:  Patient Active Problem List   Diagnosis Date Noted   Breech presentation 08/09/2023   Malpresentation before onset of labor 08/07/2023   History of gestational hypertension 08/07/2023   Previous cesarean delivery affecting pregnancy, antepartum 07/04/2023   Supervision of high risk pregnancy, antepartum 02/22/2023   History of herpes genitalis 12/13/2020   Kidney stones 01/21/2019     Past Medical History: Past Medical History:  Diagnosis Date   Acne vulgaris 05/12/2015   ADHD (attention deficit hyperactivity disorder)    ADHD (attention deficit hyperactivity disorder)    Anxiety    Anxiety and depression 05/12/2015   Fluoxetine 10mg  did not help with symptom control. Venlafaxine 37.5mg  two po qday did not help much with symptoms control.   Arthritis    history of this in her knees   Depression    Depression, major, single episode, mild (HCC) 04/23/2013   Generalized anxiety disorder 11/06/2013   Generalized anxiety disorder 11/06/2013   Genital herpes 11/2017   pos HSV 2 culture   Gestational hypertension, third trimester 06/13/2021   History of nephrolithiasis 03/07/2021   Postpartum anxiety 07/27/2021   Late July - started Zoloft 50mg    Renal stone 01/21/2019   Rhinitis, allergic 11/10/2015   S/P primary low transverse C-section 06/16/2021   Tachycardia 06/15/2021   Vaccine for human papilloma virus (HPV) types 6, 11, 16, and 18 administered     Past Surgical History: Past Surgical History:  Procedure Laterality Date   CESAREAN SECTION N/A 06/16/2021    Procedure: CESAREAN SECTION;  Surgeon: Federico Flake, MD;  Location: [facility];  Service: Obstetrics;  Laterality: N/A;    Obstetrical History: OB History     Gravida  2   Para  1   Term  1   Preterm  0   AB  0   Living  1      SAB  0   IAB  0   Ectopic  0   Multiple  0   Live Births  1           Social History Social History   Socioeconomic History   Marital status: Single    Spouse name: Not on file   Number of children: Not on file   Years of education: Not on file   Highest education level: Not on file  Occupational History   Not on file  Tobacco Use   Smoking status: Every Day    Types: E-cigarettes   Smokeless tobacco: Never  Vaping Use   Vaping status: Every Day   Substances: Nicotine  Substance and Sexual Activity   Alcohol use: Not Currently    Comment: barely   Drug use: No    Comment: history of    Sexual activity: Yes    Birth control/protection: None  Other Topics Concern   Not on file  Social History Narrative   Single.   Graduated from HS in 2017.    Student in cosmetology school.   Enjoys spending time outdoors, playing with her dog.   Social Determinants  of Health   Financial Resource Strain: Not on file  Food Insecurity: No Food Insecurity (08/09/2023)   Hunger Vital Sign    Worried About Running Out of Food in the Last Year: Never true    Ran Out of Food in the Last Year: Never true  Transportation Needs: No Transportation Needs (08/09/2023)   PRAPARE - Administrator, Civil Service (Medical): No    Lack of Transportation (Non-Medical): No  Physical Activity: Not on file  Stress: Not on file  Social Connections: Not on file    Family History: Family History  Problem Relation Age of Onset   Arthritis Mother    Varicose Veins Maternal Grandmother    Heart disease Paternal Grandmother    Varicose Veins Paternal Grandmother     Allergies: Allergies  Allergen Reactions   Cod [Fish Allergy]  Hives    Grouper fish   Fish-Derived Products Hives    Grouper fish   Other Hives and Other (See Comments)    Red hair dye Crab legs Grouper fish   Shellfish Allergy Other (See Comments)    Crab legs     Medications Prior to Admission  Medication Sig Dispense Refill Last Dose   aspirin EC 81 MG tablet Take 1 tablet (81 mg total) by mouth daily. Take after 12 weeks for prevention of preeclampsia later in pregnancy 300 tablet 2 08/09/2023 at 0650   hydrOXYzine (ATARAX) 25 MG tablet Take 1 tablet (25 mg total) by mouth every 6 (six) hours as needed for anxiety. 30 tablet 2 Past Month   Prenatal Vit-Fe Fumarate-FA (MULTIVITAMIN-PRENATAL) 27-0.8 MG TABS tablet Take 1 tablet by mouth daily at 12 noon.   08/08/2023 at 2359   valACYclovir (VALTREX) 1000 MG tablet Take 1 tablet (1,000 mg total) by mouth daily. 30 tablet 2 08/08/2023 at 2359   busPIRone (BUSPAR) 10 MG tablet Take 1 tablet (10 mg total) by mouth 2 (two) times daily. (Patient not taking: Reported on 07/17/2023) 60 tablet 2      Review of Systems   All systems reviewed and negative except as stated in HPI  Blood pressure 128/81, pulse (!) 103, temperature 97.9 F (36.6 C), temperature source Oral, resp. rate 18, height 5\' 6"  (1.676 m), weight 89.2 kg, last menstrual period 12/04/2022, SpO2 100%. General appearance: alert, cooperative, and appears stated age Lungs: clear to auscultation bilaterally Heart: regular rate and rhythm Abdomen: soft, non-tender; bowel sounds normal Pelvic: adequate Extremities: Homans sign is negative, no sign of DVT DTR's WNL Presentation: breech Fetal monitoring 145/moderate/+accels, no decels Uterine activity None     Prenatal labs: ABO, Rh: --/--/B POS (09/05 0912) Antibody: NEG (09/05 0912) Rubella: 3.68 (03/21 1547) RPR: Non Reactive (07/03 0907)  HBsAg: Negative (03/21 1547)  HIV: Non Reactive (07/03 0907)  GBS: Negative/-- (08/26 1623)  1 hr Glucola WNL Genetic screening  LR Anatomy  US WNL  Prenatal Transfer Tool  Maternal Diabetes: No Genetic Screening: Normal Maternal Ultrasounds/Referrals: Normal Fetal Ultrasounds or other Referrals:  None Maternal Substance Abuse:  No Significant Maternal Medications:  Meds include: Other: Valtrex Significant Maternal Lab Results:  None Number of Prenatal Visits:greater than 3 verified prenatal visits Other Comments:  None  Results for orders placed or performed during the hospital encounter of 08/09/23 (from the past 24 hour(s))  CBC   Collection Time: 08/09/23  9:12 AM  Result Value Ref Range   WBC 10.9 (H) 4.0 - 10.5 K/uL   RBC 4.40 3.87 - 5.11 MIL/uL  Hemoglobin 11.5 (L) 12.0 - 15.0 g/dL   HCT 76.2 (L) 83.1 - 51.7 %   MCV 81.4 80.0 - 100.0 fL   MCH 26.1 26.0 - 34.0 pg   MCHC 32.1 30.0 - 36.0 g/dL   RDW 61.6 07.3 - 71.0 %   Platelets 211 150 - 400 K/uL   nRBC 0.0 0.0 - 0.2 %  Type and screen   Collection Time: 08/09/23  9:12 AM  Result Value Ref Range   ABO/RH(D) B POS    Antibody Screen NEG    Sample Expiration      08/12/2023,2359 Performed at [facility], [address] [place], [place] [address]     Patient Active Problem List   Diagnosis Date Noted   Breech presentation 08/09/2023   Malpresentation before onset of labor 08/07/2023   History of gestational hypertension 08/07/2023   Previous cesarean delivery affecting pregnancy, antepartum 07/04/2023   Supervision of high risk pregnancy, antepartum 02/22/2023   History of herpes genitalis 12/13/2020   Kidney stones 01/21/2019    Assessment/Plan:  Judy Miller is a 27 y.o. G2P1001 at [redacted]w[redacted]d here for ECV  Reviewed risk of procedures including emergent CS, placental abruption, fetal bradycardia, uterine rupture, intolerance of procedure or failure to convert to vertex.  After informed verbal consent, patient signed consent form.  RN administered Terbutaline 0.25 mg SQ  Patient was placed in the supine position. ECV was attempted  under Ultrasound guidance.  Attempted forward roll from patient's right which moved fetal head to maternal left. Attempted backward roll from patient's left side and ECV was successfully performed.   FHR was reactive before and after the procedure.   Pt. Tolerated the procedure well.  Plan: - NST for 30 minutes after procedure - Abdominal binder placed - Discharge home for outpatient follow up - Await labor - Patient asked about pumping and I encouraged this to help uterine tone and help fetus engage in the pelvis.  Recommended one breast at a time for 15 min  Future Appointments  Date Time Provider Department Center  08/14/2023  3:50 PM Milas Hock, MD [facility]  08/21/2023  3:50 PM Milas Hock, MD [facility]  08/28/2023  3:30 PM [place] Bing, MD [facility]  10/03/2023  3:00 PM Tobb, Lavona [place], DO [facility] None   Federico Flake, MD  08/09/2023, 10:56 AM

## 2023-08-10 ENCOUNTER — Encounter: Payer: Self-pay | Admitting: Family Medicine

## 2023-08-10 NOTE — Discharge Summary (Signed)
See H&P from same day.

## 2023-08-13 ENCOUNTER — Ambulatory Visit (INDEPENDENT_AMBULATORY_CARE_PROVIDER_SITE_OTHER): Payer: Medicaid Other | Admitting: Family Medicine

## 2023-08-13 VITALS — BP 121/80 | HR 87 | Wt 195.0 lb

## 2023-08-13 DIAGNOSIS — Z8759 Personal history of other complications of pregnancy, childbirth and the puerperium: Secondary | ICD-10-CM

## 2023-08-13 DIAGNOSIS — O329XX0 Maternal care for malpresentation of fetus, unspecified, not applicable or unspecified: Secondary | ICD-10-CM

## 2023-08-13 DIAGNOSIS — O34219 Maternal care for unspecified type scar from previous cesarean delivery: Secondary | ICD-10-CM

## 2023-08-13 DIAGNOSIS — O0990 Supervision of high risk pregnancy, unspecified, unspecified trimester: Secondary | ICD-10-CM

## 2023-08-13 NOTE — Progress Notes (Signed)
   PRENATAL VISIT NOTE  Subjective:  Judy Miller is a 27 y.o. G2P1001 at [redacted]w[redacted]d being seen today for ongoing prenatal care.  She is currently monitored for the following issues for this low-risk pregnancy and has Kidney stones; History of herpes genitalis; Supervision of high risk pregnancy, antepartum; Previous cesarean delivery affecting pregnancy, antepartum; Breech--> ECV 9/5 successful; History of gestational hypertension; and Breech presentation on their problem list.  Patient reports no complaints.  Contractions: Irritability. Vag. Bleeding: None.  Movement: Present. Denies leaking of fluid.   The following portions of the patient's history were reviewed and updated as appropriate: allergies, current medications, past family history, past medical history, past social history, past surgical history and problem list.   Objective:   Vitals:   08/13/23 1430  BP: 121/80  Pulse: 87  Weight: 195 lb (88.5 kg)    Fetal Status: Fetal Heart Rate (bpm): 152   Movement: Present  Presentation: Vertex  General:  Alert, oriented and cooperative. Patient is in no acute distress.  Skin: Skin is warm and dry. No rash noted.   Cardiovascular: Normal heart rate noted  Respiratory: Normal respiratory effort, no problems with respiration noted  Abdomen: Soft, gravid, appropriate for gestational age.  Pain/Pressure: Absent     Pelvic: Cervical exam deferred        Extremities: Normal range of motion.     Mental Status: Normal mood and affect. Normal behavior. Normal judgment and thought content.   Assessment and Plan:  Pregnancy: G2P1001 at [redacted]w[redacted]d 1. Supervision of high risk pregnancy, antepartum Up to date No concerns today FH appropriate   2. Malpresentation before onset of labor, single or unspecified fetus Successful ECV Confirmed vertex today by [place]  3. Previous cesarean delivery affecting pregnancy, antepartum Desires TOLAC  4. History of gestational hypertension BP  WNL  Preterm labor symptoms and general obstetric precautions including but not limited to vaginal bleeding, contractions, leaking of fluid and fetal movement were reviewed in detail with the patient. Please refer to After Visit Summary for other counseling recommendations.   Return in about 2 weeks (around 08/27/2023) for Routine prenatal care.  Future Appointments  Date Time Provider Department Center  08/22/2023  1:30 PM Reva Bores, MD [facility]  08/28/2023  3:30 PM Rochelle Bing, MD [facility]  10/03/2023  3:00 PM Thomasene Ripple, DO [facility] None    Federico Flake, MD

## 2023-08-14 ENCOUNTER — Encounter: Payer: Medicaid Other | Admitting: Obstetrics and Gynecology

## 2023-08-21 ENCOUNTER — Encounter: Payer: Medicaid Other | Admitting: Obstetrics and Gynecology

## 2023-08-22 ENCOUNTER — Ambulatory Visit (INDEPENDENT_AMBULATORY_CARE_PROVIDER_SITE_OTHER): Payer: Medicaid Other | Admitting: Family Medicine

## 2023-08-22 VITALS — BP 126/84 | HR 96 | Wt 197.4 lb

## 2023-08-22 DIAGNOSIS — Z8619 Personal history of other infectious and parasitic diseases: Secondary | ICD-10-CM

## 2023-08-22 DIAGNOSIS — O0990 Supervision of high risk pregnancy, unspecified, unspecified trimester: Secondary | ICD-10-CM

## 2023-08-22 DIAGNOSIS — O329XX0 Maternal care for malpresentation of fetus, unspecified, not applicable or unspecified: Secondary | ICD-10-CM

## 2023-08-22 DIAGNOSIS — O34219 Maternal care for unspecified type scar from previous cesarean delivery: Secondary | ICD-10-CM

## 2023-08-22 DIAGNOSIS — Z8759 Personal history of other complications of pregnancy, childbirth and the puerperium: Secondary | ICD-10-CM

## 2023-08-22 NOTE — Progress Notes (Signed)
ROB: Wanting membrane sweep, wants to see if baby is still head down, wants cervix checked today also

## 2023-08-22 NOTE — Progress Notes (Signed)
   PRENATAL VISIT NOTE  Subjective:  Judy Miller is a 27 y.o. G2P1001 at [redacted]w[redacted]d being seen today for ongoing prenatal care.  She is currently monitored for the following issues for this low-risk pregnancy and has Kidney stones; History of herpes genitalis; Supervision of high risk pregnancy, antepartum; Previous cesarean delivery affecting pregnancy, antepartum; Breech--> ECV 9/5 successful; History of gestational hypertension; and Breech presentation on their problem list.  Patient reports no complaints.  Contractions: Irregular. Vag. Bleeding: None.  Movement: Present. Denies leaking of fluid.   The following portions of the patient's history were reviewed and updated as appropriate: allergies, current medications, past family history, past medical history, past social history, past surgical history and problem list.   Objective:   Vitals:   08/22/23 1350  BP: 126/84  Pulse: 96  Weight: 197 lb 6.4 oz (89.5 kg)    Fetal Status: Fetal Heart Rate (bpm): 157 Fundal Height: 35 cm Movement: Present  Presentation: Vertex  General:  Alert, oriented and cooperative. Patient is in no acute distress.  Skin: Skin is warm and dry. No rash noted.   Cardiovascular: Normal heart rate noted  Respiratory: Normal respiratory effort, no problems with respiration noted  Abdomen: Soft, gravid, appropriate for gestational age.  Pain/Pressure: Present     Pelvic: Cervical exam performed in the presence of a chaperone Dilation: Fingertip Effacement (%): 70, 80 Station: -2  Extremities: Normal range of motion.  Edema: None  Mental Status: Normal mood and affect. Normal behavior. Normal judgment and thought content.   Assessment and Plan:  Pregnancy: G2P1001 at [redacted]w[redacted]d 1. Malpresentation before onset of labor, single or unspecified fetus S/p successful ECV, feels Vtx  2. Previous cesarean delivery affecting pregnancy, antepartum For TOLAC  3. Supervision of high risk pregnancy, antepartum   4.  History of herpes genitalis On Valtrex  5. History of gestational hypertension Normal BP today  Term labor symptoms and general obstetric precautions including but not limited to vaginal bleeding, contractions, leaking of fluid and fetal movement were reviewed in detail with the patient. Please refer to After Visit Summary for other counseling recommendations.   Return in 1 week (on 08/29/2023).  Future Appointments  Date Time Provider Department Center  08/29/2023  3:30 PM Reva Bores, MD [facility]  10/03/2023  3:00 PM Tobb, Lavona [place], DO [facility] None    Reva Bores, MD

## 2023-08-28 ENCOUNTER — Encounter: Payer: Medicaid Other | Admitting: Obstetrics and Gynecology

## 2023-08-29 ENCOUNTER — Ambulatory Visit (INDEPENDENT_AMBULATORY_CARE_PROVIDER_SITE_OTHER): Payer: Medicaid Other | Admitting: Family Medicine

## 2023-08-29 VITALS — BP 125/82 | HR 102 | Wt 196.0 lb

## 2023-08-29 DIAGNOSIS — Z3A39 39 weeks gestation of pregnancy: Secondary | ICD-10-CM

## 2023-08-29 DIAGNOSIS — O0990 Supervision of high risk pregnancy, unspecified, unspecified trimester: Secondary | ICD-10-CM

## 2023-08-29 DIAGNOSIS — O34219 Maternal care for unspecified type scar from previous cesarean delivery: Secondary | ICD-10-CM

## 2023-08-29 DIAGNOSIS — O329XX0 Maternal care for malpresentation of fetus, unspecified, not applicable or unspecified: Secondary | ICD-10-CM

## 2023-08-29 NOTE — Progress Notes (Signed)
ROB   Pt would like cervix check.

## 2023-08-29 NOTE — Progress Notes (Signed)
    PRENATAL VISIT NOTE  Subjective:  Judy Miller is a 27 y.o. G2P1001 at [redacted]w[redacted]d being seen today for ongoing prenatal care.  She is currently monitored for the following issues for this low-risk pregnancy and has Kidney stones; History of herpes genitalis; Supervision of high risk pregnancy, antepartum; Previous cesarean delivery affecting pregnancy, antepartum; Breech--> ECV 9/5 successful; History of gestational hypertension; and Breech presentation on their problem list.  Patient reports no complaints.  Contractions: Irritability. Vag. Bleeding: None.  Movement: Present. Denies leaking of fluid.   The following portions of the patient's history were reviewed and updated as appropriate: allergies, current medications, past family history, past medical history, past social history, past surgical history and problem list.   Objective:   Vitals:   08/29/23 1557  BP: 125/82  Pulse: (!) 102  Weight: 196 lb (88.9 kg)    Fetal Status: Fetal Heart Rate (bpm): 164 Fundal Height: 36 cm Movement: Present  Presentation: Vertex  General:  Alert, oriented and cooperative. Patient is in no acute distress.  Skin: Skin is warm and dry. No rash noted.   Cardiovascular: Normal heart rate noted  Respiratory: Normal respiratory effort, no problems with respiration noted  Abdomen: Soft, gravid, appropriate for gestational age.  Pain/Pressure: Present     Pelvic: Cervical exam performed in the presence of a chaperone Dilation: Fingertip Effacement (%): 60 Station: -3  Extremities: Normal range of motion.  Edema: None  Mental Status: Normal mood and affect. Normal behavior. Normal judgment and thought content.   Assessment and Plan:  Pregnancy: G2P1001 at [redacted]w[redacted]d 1. Supervision of high risk pregnancy, antepartum Continue routine prenatal care.  2. Previous cesarean delivery affecting pregnancy, antepartum Desires TOLAC  3. Malpresentation before onset of labor, single or unspecified  fetus Vertex today  4. [redacted] weeks gestation of pregnancy  5. HSV  On Valtrex  Preterm labor symptoms and general obstetric precautions including but not limited to vaginal bleeding, contractions, leaking of fluid and fetal movement were reviewed in detail with the patient. Please refer to After Visit Summary for other counseling recommendations.   Return in 1 week (on 09/05/2023).  Future Appointments  Date Time Provider Department Center  09/09/2023  6:45 AM [facility] None  10/03/2023  3:00 PM Tobb, Lavona [place], DO [facility] None    Reva Bores, MD

## 2023-09-02 ENCOUNTER — Other Ambulatory Visit: Payer: Self-pay | Admitting: Advanced Practice Midwife

## 2023-09-04 ENCOUNTER — Encounter: Payer: Medicaid Other | Admitting: Obstetrics and Gynecology

## 2023-09-04 ENCOUNTER — Inpatient Hospital Stay (HOSPITAL_COMMUNITY)
Admission: RE | Admit: 2023-09-04 | Discharge: 2023-09-06 | DRG: 806 | Disposition: A | Discharge status: Home / Self Care | Payer: Medicaid Other | Attending: Family Medicine | Admitting: Family Medicine

## 2023-09-04 ENCOUNTER — Other Ambulatory Visit: Payer: Self-pay

## 2023-09-04 ENCOUNTER — Inpatient Hospital Stay (HOSPITAL_COMMUNITY): Payer: Medicaid Other | Admitting: Anesthesiology

## 2023-09-04 ENCOUNTER — Encounter (HOSPITAL_COMMUNITY): Payer: Self-pay | Admitting: Family Medicine

## 2023-09-04 DIAGNOSIS — F419 Anxiety disorder, unspecified: Secondary | ICD-10-CM | POA: Diagnosis present

## 2023-09-04 DIAGNOSIS — O480 Post-term pregnancy: Secondary | ICD-10-CM | POA: Diagnosis not present

## 2023-09-04 DIAGNOSIS — O99334 Smoking (tobacco) complicating childbirth: Secondary | ICD-10-CM | POA: Diagnosis present

## 2023-09-04 DIAGNOSIS — A6000 Herpesviral infection of urogenital system, unspecified: Secondary | ICD-10-CM | POA: Diagnosis present

## 2023-09-04 DIAGNOSIS — F17290 Nicotine dependence, other tobacco product, uncomplicated: Secondary | ICD-10-CM | POA: Diagnosis present

## 2023-09-04 DIAGNOSIS — O9832 Other infections with a predominantly sexual mode of transmission complicating childbirth: Secondary | ICD-10-CM | POA: Diagnosis present

## 2023-09-04 DIAGNOSIS — O34219 Maternal care for unspecified type scar from previous cesarean delivery: Secondary | ICD-10-CM | POA: Diagnosis present

## 2023-09-04 DIAGNOSIS — Z8249 Family history of ischemic heart disease and other diseases of the circulatory system: Secondary | ICD-10-CM | POA: Diagnosis not present

## 2023-09-04 DIAGNOSIS — Z3A40 40 weeks gestation of pregnancy: Secondary | ICD-10-CM

## 2023-09-04 DIAGNOSIS — Z87442 Personal history of urinary calculi: Secondary | ICD-10-CM | POA: Diagnosis not present

## 2023-09-04 DIAGNOSIS — O99344 Other mental disorders complicating childbirth: Secondary | ICD-10-CM | POA: Diagnosis present

## 2023-09-04 DIAGNOSIS — Z8261 Family history of arthritis: Secondary | ICD-10-CM | POA: Diagnosis not present

## 2023-09-04 DIAGNOSIS — O34211 Maternal care for low transverse scar from previous cesarean delivery: Secondary | ICD-10-CM | POA: Diagnosis not present

## 2023-09-04 LAB — CBC
HCT: 36.4 % (ref 36.0–46.0)
Hemoglobin: 11.8 g/dL — ABNORMAL LOW (ref 12.0–15.0)
MCH: 25.3 pg — ABNORMAL LOW (ref 26.0–34.0)
MCHC: 32.4 g/dL (ref 30.0–36.0)
MCV: 78.1 fL — ABNORMAL LOW (ref 80.0–100.0)
Platelets: 228 10*3/uL (ref 150–400)
RBC: 4.66 MIL/uL (ref 3.87–5.11)
RDW: 14.2 % (ref 11.5–15.5)
WBC: 12.4 10*3/uL — ABNORMAL HIGH (ref 4.0–10.5)
nRBC: 0 % (ref 0.0–0.2)

## 2023-09-04 LAB — TYPE AND SCREEN
ABO/RH(D): B POS
Antibody Screen: NEGATIVE

## 2023-09-04 LAB — RPR: RPR Ser Ql: NONREACTIVE

## 2023-09-04 MED ORDER — TERBUTALINE SULFATE 1 MG/ML IJ SOLN: 0.2500 mg | Freq: Once | INTRAMUSCULAR | Status: DC | PRN

## 2023-09-04 MED ORDER — SIMETHICONE 80 MG PO CHEW: 80.0000 mg | CHEWABLE_TABLET | ORAL | Status: DC | PRN

## 2023-09-04 MED ORDER — DIPHENHYDRAMINE HCL 25 MG PO CAPS: 25.0000 mg | ORAL_CAPSULE | Freq: Four times a day (QID) | ORAL | Status: DC | PRN

## 2023-09-04 MED ORDER — ACETAMINOPHEN 325 MG PO TABS
650.0000 mg | ORAL_TABLET | ORAL | Status: DC | PRN
  Administered 2023-09-05 (×2): 650 mg via ORAL
  Filled 2023-09-04: qty 2

## 2023-09-04 MED ORDER — SODIUM CHLORIDE 0.9% FLUSH: 3.0000 mL | INTRAVENOUS | Status: DC | PRN

## 2023-09-04 MED ORDER — LACTATED RINGERS IV SOLN: 500.0000 mL | Freq: Once | INTRAVENOUS | Status: DC

## 2023-09-04 MED ORDER — IBUPROFEN 600 MG PO TABS
600.0000 mg | ORAL_TABLET | Freq: Four times a day (QID) | ORAL | Status: DC
  Administered 2023-09-04 – 2023-09-06 (×7): 600 mg via ORAL
  Filled 2023-09-04 (×7): qty 1

## 2023-09-04 MED ORDER — MEASLES, MUMPS & RUBELLA VAC IJ SOLR
0.5000 mL | Freq: Once | INTRAMUSCULAR | Status: DC
  Filled 2023-09-04: qty 0.5

## 2023-09-04 MED ORDER — FLEET ENEMA RE ENEM: 1.0000 | ENEMA | RECTAL | Status: DC | PRN

## 2023-09-04 MED ORDER — OXYTOCIN-SODIUM CHLORIDE 30-0.9 UT/500ML-% IV SOLN
1.0000 m[IU]/min | INTRAVENOUS | Status: DC
  Administered 2023-09-04: 2 m[IU]/min via INTRAVENOUS
  Filled 2023-09-04: qty 500

## 2023-09-04 MED ORDER — DIBUCAINE (PERIANAL) 1 % EX OINT: 1.0000 | TOPICAL_OINTMENT | CUTANEOUS | Status: DC | PRN

## 2023-09-04 MED ORDER — SOD CITRATE-CITRIC ACID 500-334 MG/5ML PO SOLN
30.0000 mL | ORAL | Status: DC | PRN
  Administered 2023-09-04: 30 mL via ORAL
  Filled 2023-09-04: qty 30

## 2023-09-04 MED ORDER — ACETAMINOPHEN 325 MG PO TABS: 650.0000 mg | ORAL_TABLET | ORAL | Status: DC | PRN

## 2023-09-04 MED ORDER — EPHEDRINE 5 MG/ML INJ: 10.0000 mg | INTRAVENOUS | Status: DC | PRN

## 2023-09-04 MED ORDER — LACTATED RINGERS IV SOLN: INTRAVENOUS | Status: DC

## 2023-09-04 MED ORDER — WITCH HAZEL-GLYCERIN EX PADS: 1.0000 | MEDICATED_PAD | CUTANEOUS | Status: DC | PRN

## 2023-09-04 MED ORDER — LIDOCAINE HCL (PF) 1 % IJ SOLN
INTRAMUSCULAR | Status: DC | PRN
  Administered 2023-09-04 (×2): 4 mL via EPIDURAL

## 2023-09-04 MED ORDER — SENNOSIDES-DOCUSATE SODIUM 8.6-50 MG PO TABS
2.0000 | ORAL_TABLET | ORAL | Status: DC
  Administered 2023-09-04 – 2023-09-05 (×2): 2 via ORAL
  Filled 2023-09-04 (×2): qty 2

## 2023-09-04 MED ORDER — OXYTOCIN-SODIUM CHLORIDE 30-0.9 UT/500ML-% IV SOLN: 2.5000 [IU]/h | INTRAVENOUS | Status: DC

## 2023-09-04 MED ORDER — LIDOCAINE HCL (PF) 1 % IJ SOLN: 30.0000 mL | INTRAMUSCULAR | Status: DC | PRN

## 2023-09-04 MED ORDER — ONDANSETRON HCL 4 MG PO TABS: 4.0000 mg | ORAL_TABLET | ORAL | Status: DC | PRN

## 2023-09-04 MED ORDER — SODIUM CHLORIDE 0.9% FLUSH: 3.0000 mL | Freq: Two times a day (BID) | INTRAVENOUS | Status: DC

## 2023-09-04 MED ORDER — ONDANSETRON HCL 4 MG/2ML IJ SOLN: 4.0000 mg | INTRAMUSCULAR | Status: DC | PRN

## 2023-09-04 MED ORDER — ONDANSETRON HCL 4 MG/2ML IJ SOLN: 4.0000 mg | Freq: Four times a day (QID) | INTRAMUSCULAR | Status: DC | PRN

## 2023-09-04 MED ORDER — OXYTOCIN-SODIUM CHLORIDE 30-0.9 UT/500ML-% IV SOLN: 1.0000 m[IU]/min | INTRAVENOUS | Status: DC

## 2023-09-04 MED ORDER — TETANUS-DIPHTH-ACELL PERTUSSIS 5-2.5-18.5 LF-MCG/0.5 IM SUSY
0.5000 mL | PREFILLED_SYRINGE | Freq: Once | INTRAMUSCULAR | Status: DC
  Filled 2023-09-04: qty 0.5

## 2023-09-04 MED ORDER — PHENYLEPHRINE 80 MCG/ML (10ML) SYRINGE FOR IV PUSH (FOR BLOOD PRESSURE SUPPORT): 80.0000 ug | PREFILLED_SYRINGE | INTRAVENOUS | Status: DC | PRN

## 2023-09-04 MED ORDER — LACTATED RINGERS IV SOLN: 500.0000 mL | INTRAVENOUS | Status: DC | PRN

## 2023-09-04 MED ORDER — SODIUM CHLORIDE 0.9 % IV SOLN: 250.0000 mL | INTRAVENOUS | Status: DC | PRN

## 2023-09-04 MED ORDER — ZOLPIDEM TARTRATE 5 MG PO TABS: 5.0000 mg | ORAL_TABLET | Freq: Every evening | ORAL | Status: DC | PRN

## 2023-09-04 MED ORDER — FENTANYL-BUPIVACAINE-NACL 0.5-0.125-0.9 MG/250ML-% EP SOLN
12.0000 mL/h | EPIDURAL | Status: DC | PRN
  Administered 2023-09-04: 12 mL/h via EPIDURAL
  Filled 2023-09-04: qty 250

## 2023-09-04 MED ORDER — OXYTOCIN BOLUS FROM INFUSION
333.0000 mL | Freq: Once | INTRAVENOUS | Status: AC
  Administered 2023-09-04: 333 mL via INTRAVENOUS

## 2023-09-04 MED ORDER — COCONUT OIL OIL: 1.0000 | TOPICAL_OIL | Status: DC | PRN

## 2023-09-04 MED ORDER — DIPHENHYDRAMINE HCL 50 MG/ML IJ SOLN
12.5000 mg | INTRAMUSCULAR | Status: DC | PRN
  Administered 2023-09-04: 12.5 mg via INTRAVENOUS
  Filled 2023-09-04: qty 1

## 2023-09-04 MED ORDER — BENZOCAINE-MENTHOL 20-0.5 % EX AERO
1.0000 | INHALATION_SPRAY | CUTANEOUS | Status: DC | PRN
  Filled 2023-09-04: qty 56

## 2023-09-04 MED ORDER — FENTANYL CITRATE (PF) 100 MCG/2ML IJ SOLN
50.0000 ug | INTRAMUSCULAR | Status: DC | PRN
  Administered 2023-09-04: 50 ug via INTRAVENOUS
  Filled 2023-09-04: qty 2

## 2023-09-04 NOTE — Discharge Summary (Signed)
Postpartum Discharge Summary  Date of Service updated***     Patient Name: Judy Miller DOB: 01-28-1996 MRN: [id number]  Date of admission: 09/04/2023 Delivery date:09/04/2023 Delivering provider: Wyn Forster Date of discharge: 09/04/2023  Admitting diagnosis: Post term pregnancy over 40 weeks [O48.0] Intrauterine pregnancy: [redacted]w[redacted]d     Secondary diagnosis:  Principal Problem:   Post term pregnancy over 40 weeks  Additional problems: ***    Discharge diagnosis: {DX.:23714}                                              Post partum procedures:{Postpartum procedures:23558} Augmentation: {Augmentation:20782} Complications: {OB Labor/Delivery Complications:20784}  Hospital course: {Courses:23701}  Magnesium Sulfate received: {Mag received:30440022} BMZ received: {BMZ received:30440023} Rhophylac:{Rhophylac received:30440032} WUJ:{WJX:91478295} T-DaP:{Tdap:23962} Flu: {AOZ:30865} RSV Vaccine received: {RSV:31013} Transfusion:{Transfusion received:30440034}  Immunizations received: Immunization History  Administered Date(s) Administered   HPV Quadrivalent 04/23/2013, 12/02/2013, 07/01/2014   Hepatitis A 02/07/2007, 08/30/2007   Hepatitis B 06/09/1998, 08/06/1998, 03/31/1999   MMR 03/31/1999, 05/16/2003   Tdap 04/23/2013, 04/20/2021   Varicella 03/31/1999    Physical exam  Vitals:   09/04/23 2230 09/04/23 2245 09/04/23 2300 09/04/23 2330  BP: 120/79 130/85 131/77 111/74  Pulse: (!) 117 (!) 121 (!) 135 (!) 127  Resp: 16 15 15 16   Temp: 98.1 F (36.7 C)   98.2 F (36.8 C)  TempSrc: Oral   Oral  Weight:      Height:       General: {Exam; general:21111117} Lochia: {Desc; appropriate/inappropriate:30686::"appropriate"} Uterine Fundus: {Desc; firm/soft:30687} Incision: {Exam; incision:21111123} DVT Evaluation: {Exam; dvt:2111122} Labs: Lab Results  Component Value Date   WBC 12.4 (H) 09/04/2023   HGB 11.8 (L) 09/04/2023   HCT 36.4 09/04/2023   MCV  78.1 (L) 09/04/2023   PLT 228 09/04/2023      Latest Ref Rng & Units 05/17/2023   12:20 PM  CMP  Glucose 70 - 99 mg/dL 96   BUN 6 - 20 mg/dL 6   Creatinine 7.84 - 6.96 mg/dL 2.95   Sodium 284 - 132 mmol/L 136   Potassium 3.5 - 5.1 mmol/L 3.6   Chloride 98 - 111 mmol/L 101   CO2 22 - 32 mmol/L 23   Calcium 8.9 - 10.3 mg/dL 8.6    Edinburgh Score:    07/27/2021    1:41 PM  Edinburgh Postnatal Depression Scale Screening Tool  I have been able to laugh and see the funny side of things. 0  I have looked forward with enjoyment to things. 0  I have blamed myself unnecessarily when things went wrong. 0  I have been anxious or worried for no good reason. 0  I have felt scared or panicky for no good reason. 0  Things have been getting on top of me. 0  I have been so unhappy that I have had difficulty sleeping. 0  I have felt sad or miserable. 0  I have been so unhappy that I have been crying. 1  The thought of harming myself has occurred to me. 0  Edinburgh Postnatal Depression Scale Total 1   No data recorded  After visit meds:  Allergies as of 09/04/2023       Reactions   Fish Allergy Hives   Grouper specifically   Other Hives   Red hair dye   Shellfish Allergy Hives   Crab legs, specifically  Med Rec must be completed prior to using this [facility]***        Discharge home in stable condition Infant Feeding: {Baby feeding:23562} Infant Disposition:{CHL IP OB HOME WITH WUJWJX:91478} Discharge instruction: per After Visit Summary and Postpartum booklet. Activity: Advance as tolerated. Pelvic rest for 6 weeks.  Diet: {OB GNFA:21308657} Future Appointments: Future Appointments  Date Time Provider Department Center  10/03/2023  3:00 PM Tobb, Kardie, DO [facility] None   Follow up Visit:   Please schedule this patient for a {Visit type:23955} postpartum visit in {Postpartum visit:23953} with the following provider: {Provider type:23954}. Additional Postpartum  F/U:{PP Procedure:23957}  {Risk level:23960} pregnancy complicated by: {complication:23959} Delivery mode:  Vaginal, Spontaneous Anticipated Birth Control:  {Birth Control:23956}   09/04/2023 Wyn Forster, MD

## 2023-09-04 NOTE — Progress Notes (Signed)
LABOR PROGRESS NOTE  Called by bedside RN. Patient dilated to 8.5/100/0. Patient feeling comfortable with epidural with mild urge to push.  Anticipate VBAC.  Joanne Gavel, MD

## 2023-09-04 NOTE — H&P (Signed)
OBSTETRIC ADMISSION HISTORY AND PHYSICAL  Judy Miller is a 27 y.o. female G2P1001 with IUP at [redacted]w[redacted]d by 13wk [place] presenting for PDIOL. She reports +FMs, No LOF, no VB, no blurry vision, headaches or peripheral edema, and RUQ pain.  She plans on breast feeding. She request ParaGard IUD at [facility] visit for birth control. She received her prenatal care at  [facility]    Dating: By 13wk  [place] --->  Estimated Date of Delivery: 08/30/23  Sono:   @[redacted]w[redacted]d , CWD, normal anatomy, transverse w/head to maternal right presentation, posterior placental lie, 623 gm 1 lb 6 oz 19 %    Prenatal History/Complications:  - Hx of CS - Successful ECV 9/5 - hx gHTN - hx renal calculi - HSV last out break on outer left thigh, 2 months ago, on suppression therapy  Past Medical History: Past Medical History:  Diagnosis Date   Acne vulgaris 05/12/2015   ADHD (attention deficit hyperactivity disorder)    ADHD (attention deficit hyperactivity disorder)    Anxiety    Anxiety and depression 05/12/2015   Fluoxetine 10mg  did not help with symptom control. Venlafaxine 37.5mg  two po qday did not help much with symptoms control.   Arthritis    history of this in her knees   Depression    Depression, major, single episode, mild (HCC) 04/23/2013   Generalized anxiety disorder 11/06/2013   Generalized anxiety disorder 11/06/2013   Genital herpes 11/2017   pos HSV 2 culture   Gestational hypertension, third trimester 06/13/2021   History of nephrolithiasis 03/07/2021   Postpartum anxiety 07/27/2021   Late July - started Zoloft 50mg    Renal stone 01/21/2019   Rhinitis, allergic 11/10/2015   S/P primary low transverse C-section 06/16/2021   Tachycardia 06/15/2021   Vaccine for human papilloma virus (HPV) types 6, 11, 16, and 18 administered     Past Surgical History: Past Surgical History:  Procedure Laterality Date   CESAREAN SECTION N/A 06/16/2021   Procedure: CESAREAN SECTION;  Surgeon: Federico Flake, MD;  Location: [facility];  Service: Obstetrics;  Laterality: N/A;    Obstetrical History: OB History     Gravida  2   Para  1   Term  1   Preterm  0   AB  0   Living  1      SAB  0   IAB  0   Ectopic  0   Multiple  0   Live Births  1           Social History Social History   Socioeconomic History   Marital status: Single    Spouse name: Not on file   Number of children: Not on file   Years of education: Not on file   Highest education level: Not on file  Occupational History   Not on file  Tobacco Use   Smoking status: Every Day    Types: E-cigarettes   Smokeless tobacco: Never  Vaping Use   Vaping status: Every Day   Substances: Nicotine  Substance and Sexual Activity   Alcohol use: Not Currently    Comment: barely   Drug use: No    Comment: history of    Sexual activity: Yes    Birth control/protection: None  Other Topics Concern   Not on file  Social History Narrative   Single.   Graduated from HS in 2017.    Student in cosmetology school.   Enjoys spending time outdoors, playing with  her dog.   Social Determinants of Health   Financial Resource Strain: Not on file  Food Insecurity: No Food Insecurity (08/09/2023)   Hunger Vital Sign    Worried About Running Out of Food in the Last Year: Never true    Ran Out of Food in the Last Year: Never true  Transportation Needs: No Transportation Needs (08/09/2023)   PRAPARE - Administrator, Civil Service (Medical): No    Lack of Transportation (Non-Medical): No  Physical Activity: Not on file  Stress: Not on file  Social Connections: Not on file    Family History: Family History  Problem Relation Age of Onset   Arthritis Mother    Varicose Veins Maternal Grandmother    Heart disease Paternal Grandmother    Varicose Veins Paternal Grandmother     Allergies: Allergies  Allergen Reactions   Cod [Fish Allergy] Hives    Grouper fish   Fish-Derived Products Hives     Grouper fish   Other Hives and Other (See Comments)    Red hair dye Crab legs Grouper fish   Shellfish Allergy Other (See Comments)    Crab legs     Medications Prior to Admission  Medication Sig Dispense Refill Last Dose   aspirin EC 81 MG tablet Take 1 tablet (81 mg total) by mouth daily. Take after 12 weeks for prevention of preeclampsia later in pregnancy 300 tablet 2 09/03/2023   Prenatal Vit-Fe Fumarate-FA (MULTIVITAMIN-PRENATAL) 27-0.8 MG TABS tablet Take 1 tablet by mouth daily at 12 noon.   09/03/2023   valACYclovir (VALTREX) 1000 MG tablet Take 1 tablet (1,000 mg total) by mouth daily. 30 tablet 2 09/03/2023   busPIRone (BUSPAR) 10 MG tablet Take 1 tablet (10 mg total) by mouth 2 (two) times daily. (Patient not taking: Reported on 07/17/2023) 60 tablet 2    hydrOXYzine (ATARAX) 25 MG tablet Take 1 tablet (25 mg total) by mouth every 6 (six) hours as needed for anxiety. 30 tablet 2      Review of Systems   All systems reviewed and negative except as stated in HPI  Blood pressure 127/78, pulse (!) 121, temperature 98.7 F (37.1 C), height 5\' 6"  (1.676 m), weight 90.4 kg, last menstrual period 12/04/2022. General appearance: alert, cooperative, appears stated age, and no distress Lungs: clear to auscultation bilaterally Heart: regular rate and rhythm Abdomen: soft, non-tender; bowel sounds normal Pelvic: adequate Extremities: Homans sign is negative, no sign of DVT Presentation: cephalic Fetal monitoringBaseline: 155 bpm, Variability: Good {> 6 bpm), Accelerations: Reactive, and Decelerations: Absent Uterine activityNone Dilation: 1 Effacement (%): 50 Station: -3   Prenatal labs: ABO, Rh: --/--/B POS (10/01 0055) Antibody: NEG (10/01 0055) Rubella: 3.68 (03/21 1547) RPR: Non Reactive (07/03 0907)  HBsAg: Negative (03/21 1547)  HIV: Non Reactive (07/03 0907)  GBS: Negative/-- (08/26 1623)  2 hr Glucola passed Anatomy US normal  Prenatal Transfer Tool  Maternal  Diabetes: No Genetic Screening: Normal Maternal Ultrasounds/Referrals: Normal Fetal Ultrasounds or other Referrals:  None Maternal Substance Abuse:  Yes:  Type: Smoker Significant Maternal Medications:  Meds include: Other:  Buspar, Valacyclovir Significant Maternal Lab Results:  Group B Strep negative Number of Prenatal Visits:greater than 3 verified prenatal visits Other Comments:  None  Results for orders placed or performed during the hospital encounter of 09/04/23 (from the past 24 hour(s))  Type and screen   Collection Time: 09/04/23 12:55 AM  Result Value Ref Range   ABO/RH(D) B POS  Antibody Screen NEG    Sample Expiration      09/07/2023,2359 Performed at [facility], [address] [place], [place] [address]   CBC   Collection Time: 09/04/23 12:57 AM  Result Value Ref Range   WBC 12.4 (H) 4.0 - 10.5 K/uL   RBC 4.66 3.87 - 5.11 MIL/uL   Hemoglobin 11.8 (L) 12.0 - 15.0 g/dL   HCT 47.8 29.5 - 62.1 %   MCV 78.1 (L) 80.0 - 100.0 fL   MCH 25.3 (L) 26.0 - 34.0 pg   MCHC 32.4 30.0 - 36.0 g/dL   RDW 30.8 65.7 - 84.6 %   Platelets 228 150 - 400 K/uL   nRBC 0.0 0.0 - 0.2 %    Patient Active Problem List   Diagnosis Date Noted   Post term pregnancy over 40 weeks 09/04/2023   Breech presentation 08/09/2023   Breech--> ECV 9/5 successful 08/07/2023   History of gestational hypertension 08/07/2023   Previous cesarean delivery affecting pregnancy, antepartum 07/04/2023   Supervision of high risk pregnancy, antepartum 02/22/2023   History of herpes genitalis 12/13/2020   Kidney stones 01/21/2019    Assessment/Plan:  Judy Miller is a 27 y.o. G2P1001 at [redacted]w[redacted]d here for PDIOL  #Labor: Discussed role, risk and benefits of FB, pitocin and AROM. Patient agreeable. FB placed, 60cc sterile fluid.  #Pain: Desires to avoid epidural. Nitrous and Fentanyl discussed.  #FWB: Cat I #ID:  GBS negative #MOF: Breast #MOC: ParaGard out patient  Wyn Forster, MD   09/04/2023, 3:38 AM

## 2023-09-04 NOTE — Anesthesia Procedure Notes (Signed)
Epidural Patient location during procedure: OB Start time: 09/04/2023 6:44 AM End time: 09/04/2023 6:49 AM  Staffing Anesthesiologist: Linton Rump, MD Performed: anesthesiologist   Preanesthetic Checklist Completed: patient identified, IV checked, site marked, risks and benefits discussed, surgical consent, monitors and equipment checked, pre-op evaluation and timeout performed  Epidural Patient position: sitting Prep: DuraPrep and site prepped and draped Patient monitoring: continuous pulse ox and blood pressure Approach: midline Location: L3-L4 Injection technique: LOR saline  Needle:  Needle type: Tuohy  Needle gauge: 17 G Needle length: 9 cm and 9 Needle insertion depth: 4 cm Catheter type: closed end flexible Catheter size: 19 Gauge Catheter at skin depth: 8 cm Test dose: negative  Assessment Events: blood not aspirated, no cerebrospinal fluid, injection not painful, no injection resistance, no paresthesia and negative IV test  Additional Notes The patient has requested an epidural for labor pain management. Risks and benefits including, but not limited to, infection, bleeding, local anesthetic toxicity, headache, hypotension, back pain, block failure, etc. were discussed with the patient. The patient expressed understanding and consented to the procedure. I confirmed that the patient has no bleeding disorders and is not taking blood thinners. I confirmed the patient's last platelet count with the nurse. A time-out was performed immediately prior to the procedure. Please see nursing documentation for vital signs. Sterile technique was used throughout the whole procedure. Once LOR achieved, the epidural catheter threaded easily without resistance. Aspiration of the catheter was negative for blood and CSF. The epidural was dosed slowly and an infusion was started.  1 attempt(s)Reason for block:procedure for pain

## 2023-09-04 NOTE — Progress Notes (Signed)
Judy Miller is a 27 y.o. G2P1001 at 106w5d.  Subjective: Comfortable with epidural  Objective: BP 112/79   Pulse (!) 112   Temp 98 F (36.7 C)   Resp 18   Ht 5\' 6"  (1.676 m)   Wt 90.4 kg   LMP 12/04/2022   BMI 32.15 kg/m    FHT:  FHR: 140 bpm, variability: Moderate,  accelerations: 15 X15,  decelerations: Mild variables UC:   Q 2-4 minutes, moderate Normal external female genitalia.  No lesions. Dilation: 4.5 Effacement (%): 70 Cervical Position: Middle Station: -1 Presentation: Vertex Exam by:: Dorathy Kinsman CNM AROM moderate amount of clear fluid.  Labs: Results for orders placed or performed during the hospital encounter of 09/04/23 (from the past 24 hour(s))  Type and screen     Status: None   Collection Time: 09/04/23 12:55 AM  Result Value Ref Range   ABO/RH(D) B POS    Antibody Screen NEG    Sample Expiration      09/07/2023,2359 Performed at [facility], [address], [place], [place] [address]   CBC     Status: Abnormal   Collection Time: 09/04/23 12:57 AM  Result Value Ref Range   WBC 12.4 (H) 4.0 - 10.5 K/uL   RBC 4.66 3.87 - 5.11 MIL/uL   Hemoglobin 11.8 (L) 12.0 - 15.0 g/dL   HCT 62.1 30.8 - 65.7 %   MCV 78.1 (L) 80.0 - 100.0 fL   MCH 25.3 (L) 26.0 - 34.0 pg   MCHC 32.4 30.0 - 36.0 g/dL   RDW 84.6 96.2 - 95.2 %   Platelets 228 150 - 400 K/uL   nRBC 0.0 0.0 - 0.2 %  RPR     Status: None   Collection Time: 09/04/23 12:57 AM  Result Value Ref Range   RPR Ser Ql NON REACTIVE NON REACTIVE    Assessment / Plan: [redacted]w[redacted]d week IUP Labor/TOLAC: Early.  Now AROM'd.  Continue to titrate Pitocin to achieve adequate contractions. Fetal Wellbeing:  Category 1 Pain Control: Epidural Anticipated MOD: SVD  Katrinka Blazing [place], CNM 09/04/2023 11:43 AM

## 2023-09-04 NOTE — Progress Notes (Signed)
Labor Progress Note Judy Miller is a 27 y.o. G2P1001 at [redacted]w[redacted]d presented for PDIOL and TOLAC S: Coping well  O:  BP 127/78   Pulse (!) 121   Temp 98.7 F (37.1 C)   Ht 5\' 6"  (1.676 m)   Wt 90.4 kg   LMP 12/04/2022   BMI 32.15 kg/m  EFM: 150/moderate variability/accels present/no decels  CVE: Dilation: 1 Effacement (%): 50 Station: -3   A&P: 27 y.o. G2P1001 [redacted]w[redacted]d admitted for PDIOL #Labor: Progressing well. FB remains in place. Will start low dose pitocin. #Pain: Coping well without medication #FWB: Cat I #GBS negative   Wyn Forster, MD 5:34 AM

## 2023-09-04 NOTE — Plan of Care (Signed)

## 2023-09-04 NOTE — Progress Notes (Signed)
LABOR PROGRESS NOTE  Judy Miller is a 27 y.o. G2P1001 at [redacted]w[redacted]d presented for PDIOL. TOLAC.  S: Comfortable with epidural. Amenable to IUPC placement.  O:  BP (!) 103/58   Pulse 71   Temp 98.2 F (36.8 C) (Oral)   Resp 18   Ht 5\' 6"  (1.676 m)   Wt 90.4 kg   LMP 12/04/2022   BMI 32.15 kg/m  EFM:145 bpm/Moderate variability/ 15x15 accels/ Early decels CAT: 1 Toco: regular, every 1-2 minutes, some couplets   CVE: Dilation: 4.5 Effacement (%): 70 Cervical Position: Middle Station: -1 Presentation: Vertex Exam by:: Dorathy Kinsman CNM   A&P: 27 y.o. G2P1001 [redacted]w[redacted]d here for PDIOL as above. TOLAC  #Labor: No cervical change since 0735. IUPC placed. Continue to titrate pitocin to adequate MVUs #Pain: Epidural #FWB: CAT I #GBS negative  Joanne Gavel, MD FMOB Fellow, Faculty practice [facility], Center for [facility] 09/04/23  3:48 PM

## 2023-09-04 NOTE — Plan of Care (Signed)

## 2023-09-04 NOTE — Anesthesia Preprocedure Evaluation (Signed)
Anesthesia Evaluation  Patient identified by MRN, date of birth, ID band Patient awake    Reviewed: Allergy & Precautions, NPO status , Patient's Chart, lab work & pertinent test results  History of Anesthesia Complications Negative for: history of anesthetic complications  Airway Mallampati: III  TM Distance: >3 FB Neck ROM: Full    Dental   Pulmonary Current Smoker   Pulmonary exam normal breath sounds clear to auscultation       Cardiovascular hypertension,  Rhythm:Regular Rate:Normal     Neuro/Psych  PSYCHIATRIC DISORDERS Anxiety Depression       GI/Hepatic negative GI ROS, Neg liver ROS,,,  Endo/Other  negative endocrine ROS    Renal/GU negative Renal ROS     Musculoskeletal  (+) Arthritis ,    Abdominal   Peds  Hematology negative hematology ROS (+) Lab Results      Component                Value               Date                      WBC                      12.4 (H)            09/04/2023                HGB                      11.8 (L)            09/04/2023                HCT                      36.4                09/04/2023                MCV                      78.1 (L)            09/04/2023                PLT                      228                 09/04/2023              Anesthesia Other Findings TOLAC  Takes baby aspirin  Reproductive/Obstetrics (+) Pregnancy                             Anesthesia Physical Anesthesia Plan  ASA: 2  Anesthesia Plan: Epidural   Post-op Pain Management:    Induction:   PONV Risk Score and Plan:   Airway Management Planned:   Additional Equipment:   Intra-op Plan:   Post-operative Plan:   Informed Consent: I have reviewed the patients History and Physical, chart, labs and discussed the procedure including the risks, benefits and alternatives for the proposed anesthesia with the patient or authorized representative who has  indicated his/her understanding and acceptance.       Plan Discussed with: Anesthesiologist  Anesthesia Plan Comments: (I have discussed risks of neuraxial anesthesia including but not limited to infection, bleeding, nerve injury, back pain, headache, seizures, and failure of block. Patient denies bleeding disorders and is not currently anticoagulated. Labs have been reviewed. Risks and benefits discussed. All patient's questions answered.  )       Anesthesia Quick Evaluation

## 2023-09-05 ENCOUNTER — Encounter (HOSPITAL_COMMUNITY): Payer: Self-pay | Admitting: Family Medicine

## 2023-09-05 LAB — CBC
HCT: 34.1 % — ABNORMAL LOW (ref 36.0–46.0)
Hemoglobin: 11 g/dL — ABNORMAL LOW (ref 12.0–15.0)
MCH: 25.8 pg — ABNORMAL LOW (ref 26.0–34.0)
MCHC: 32.3 g/dL (ref 30.0–36.0)
MCV: 80 fL (ref 80.0–100.0)
Platelets: 206 10*3/uL (ref 150–400)
RBC: 4.26 MIL/uL (ref 3.87–5.11)
RDW: 14.7 % (ref 11.5–15.5)
WBC: 18.1 10*3/uL — ABNORMAL HIGH (ref 4.0–10.5)
nRBC: 0 % (ref 0.0–0.2)

## 2023-09-05 NOTE — Lactation Note (Signed)
This note was copied from a baby's chart. Lactation Consultation Note  Patient Name: Judy Miller [id number] Date: 09/05/2023 Age:27 years Reason for consult: Initial assessment;Term.See Birth Parent's MR: VBAC  P2, Birth Parent used hand pump and expressed 3 mls of colostrum prior to [facility] entering the room. Birth Parent did reverse pressure softening prior to latching infant on her left breast using the football hold position. Infant latched but did not elict the suck and swallow response was sleepy. Afterwards Birth Parent gave infant 5 mls of colostrum by spoon she pumped and hand expressed. Per Birth Parent, she has EBM at home that she had pumped piror to delivery. Birth Parent really wants to work on latching infant ( breastfeeding ) not solely pumping with 2nd child. Birth Parent was made aware of O/P services, breastfeeding support groups, community resources, and our phone # for post-discharge questions.    Current feeding plan:  1- Birth Parent will do reverse pressure softening or use hand pump prior to latching infant at the breast, will BF infant by cues, on demand, every 2-3 hours, skin to skin. 2- Birth Parent will continue to ask RN/LC for latch assistance if needed. 3- Birth Parent will use hand pump or hand express and give infant back colostrum by spoon if infant does not latch and continue to work on breastfeeding.  4- Birth Parent will stay hydrated, rest and have balance meals and snacks.   Maternal Data Has patient been taught Hand Expression?: Yes Does the patient have breastfeeding experience prior to this delivery?: Yes How long did the patient breastfeed?: Per Birth Parent, she had latch difficulties with 1st child so she exclusively pumped for 2 months, really would like to latch 2nd child at the breast.  Feeding Mother's Current Feeding Choice: Breast Milk  LATCH Score Latch: Too sleepy or reluctant, no latch achieved, no sucking elicited.  Audible  Swallowing: None  Type of Nipple: Flat (Birth Parent will pre-pump with hand pump or do reverse pressure softening prior to latching infant at the breast.)  Comfort (Breast/Nipple): Soft / non-tender  Hold (Positioning): Assistance needed to correctly position infant at breast and maintain latch.  LATCH Score: 4   Lactation Tools Discussed/Used    Interventions Interventions: Breast feeding basics reviewed;Assisted with latch;Skin to skin;Hand express;Pre-pump if needed;Position options;Support pillows;Adjust position;Breast compression;Education;LC Services brochure  Discharge Pump: DEBP;Personal  Consult Status Consult Status: Follow-up Date: 09/05/23 Follow-up type: In-patient    Frederico Hamman 09/05/2023, 12:37 AM

## 2023-09-05 NOTE — Lactation Note (Signed)
This note was copied from a baby's chart. Lactation Consultation Note  Patient Name: Judy Miller [id number] Date: 09/05/2023 Age:25 hours Reason for consult: Follow-up assessment;Mother's request;Term;Breastfeeding assistance  P2- MOB requested LC's assistance with latching infant to breast. LC assisted placing infant on the left breast in the football hold. Infant would latch, but tongue thrusted, which caused her to frequently slip off. LC and MOB attempted this for 15 or so minutes before deciding to try a bottle. LC wanted to see if the Nfant white slow flow nipple would help with infant's tongue movement. Infant did the same things with the bottle, but eventually started nutritively sucking. Infant drank 24 mL of MOB's EBM. LC encouraged MOB to continue practicing with latches, but if infant tongue thrusts, use the bottle to assist in proper tongue movement.  Maternal Data Has patient been taught Hand Expression?: Yes Does the patient have breastfeeding experience prior to this delivery?: Yes How long did the patient breastfeed?: MOB exclusive pumped for two months, but stopped because it was upsetting not being able to latch infant.  Feeding Mother's Current Feeding Choice: Breast Milk Nipple Type: Nfant Standard Flow (white)  LATCH Score Latch: Repeated attempts needed to sustain latch, nipple held in mouth throughout feeding, stimulation needed to elicit sucking reflex.  Audible Swallowing: None  Type of Nipple: Flat  Comfort (Breast/Nipple): Soft / non-tender  Hold (Positioning): Full assist, staff holds infant at breast  LATCH Score: 4   Lactation Tools Discussed/Used Tools: Pump;Flanges;Coconut oil Breast pump type: Manual;Double-Electric Breast Pump Pump Education: Milk Storage;Setup, frequency, and cleaning  Interventions Interventions: Breast feeding basics reviewed;Assisted with latch;Reverse pressure;Breast compression;Adjust position;Support  pillows;Position options;Expressed milk;Coconut oil;Education;Pace feeding  Discharge Discharge Education: Warning signs for feeding baby  Consult Status Consult Status: Follow-up Date: 09/06/23 Follow-up type: In-patient    Dema Severin BS, IBCLC 09/05/2023, 4:37 PM

## 2023-09-05 NOTE — Social Work (Signed)
MOB was referred for history of depression/anxiety.  * Referral screened out by Clinical Social Worker because none of the following criteria appear to apply:  ~ History of anxiety/depression during this pregnancy, or of post-partum depression following prior delivery.  ~ Diagnosis of anxiety and/or depression within last 3 years OR * MOB's symptoms currently being treated with medication and/or therapy.Per chart review MOB is currently prescribed Buspar and atarax as needed.  Please contact the Clinical Social Worker if needs arise, by MOB request.   Wende Neighbors, LCSWA Clinical Social Worker [phone]

## 2023-09-05 NOTE — Anesthesia Postprocedure Evaluation (Signed)
Anesthesia Post Note  Patient: Judy Miller  Procedure(s) Performed: AN AD HOC LABOR EPIDURAL     Patient location during evaluation: Mother Baby Anesthesia Type: Epidural Level of consciousness: awake, oriented and awake and alert Pain management: pain level controlled Vital Signs Assessment: post-procedure vital signs reviewed and stable Respiratory status: spontaneous breathing, respiratory function stable and nonlabored ventilation Cardiovascular status: stable Postop Assessment: no headache, adequate PO intake, patient able to bend at knees, able to ambulate and no apparent nausea or vomiting Anesthetic complications: no   No notable events documented.  Last Vitals:  Vitals:   09/05/23 0029 09/05/23 0500  BP: 114/77 122/76  Pulse: (!) 124 (!) 103  Resp: 18 16  Temp: 36.8 C 36.8 C    Last Pain:  Vitals:   09/05/23 0719  TempSrc:   PainSc: Asleep   Pain Goal:                   Saryah Loper

## 2023-09-05 NOTE — Plan of Care (Signed)

## 2023-09-05 NOTE — Lactation Note (Signed)
This note was copied from a baby's chart. Lactation Consultation Note  Patient Name: Judy Miller [id number] Date: 09/05/2023 Age:25 hours Reason for consult: Follow-up assessment;RN request  P2- LC attempted to see MOB, but the Tech was performing infant's hearing screen. MOB requested for LC to come back when the hearing screen is done.  Consult Status Consult Status: Follow-up Date: 09/05/23 Follow-up type: In-patient    Dema Severin BS, IBCLC 09/05/2023, 3:30 PM

## 2023-09-06 MED ORDER — IBUPROFEN 600 MG PO TABS: 600.0000 mg | ORAL_TABLET | Freq: Four times a day (QID) | ORAL | 0 refills | Status: DC

## 2023-09-06 NOTE — Discharge Instructions (Signed)
WHAT TO LOOK OUT FOR: Fever of 100.4 or above Mastitis: feels like flu and breasts hurt Infection: increased pain, swelling or redness Blood clots golf ball size or larger Postpartum depression   Congratulations on your newest addition!

## 2023-09-06 NOTE — Plan of Care (Signed)

## 2023-09-06 NOTE — Plan of Care (Signed)
  Problem: Education: Goal: Knowledge of condition will improve 09/06/2023 1050 by Donne Hazel, LPN Outcome: Adequate for Discharge 09/06/2023 1017 by Donne Hazel, LPN Outcome: Progressing Goal: Individualized Educational Video(s) 09/06/2023 1050 by Donne Hazel, LPN Outcome: Adequate for Discharge 09/06/2023 1017 by Donne Hazel, LPN Outcome: Progressing Goal: Individualized Newborn Educational Video(s) 09/06/2023 1050 by Donne Hazel, LPN Outcome: Adequate for Discharge 09/06/2023 1017 by Donne Hazel, LPN Outcome: Progressing   Problem: Activity: Goal: Will verbalize the importance of balancing activity with adequate rest periods 09/06/2023 1050 by Donne Hazel, LPN Outcome: Adequate for Discharge 09/06/2023 1017 by Adam Phenix A, LPN Outcome: Progressing Goal: Ability to tolerate increased activity will improve 09/06/2023 1050 by Donne Hazel, LPN Outcome: Adequate for Discharge 09/06/2023 1017 by Donne Hazel, LPN Outcome: Progressing   Problem: Coping: Goal: Ability to identify and utilize available resources and services will improve 09/06/2023 1050 by Donne Hazel, LPN Outcome: Adequate for Discharge 09/06/2023 1017 by Donne Hazel, LPN Outcome: Progressing   Problem: Life Cycle: Goal: Chance of risk for complications during the postpartum period will decrease 09/06/2023 1050 by Donne Hazel, LPN Outcome: Adequate for Discharge 09/06/2023 1017 by Donne Hazel, LPN Outcome: Progressing   Problem: Role Relationship: Goal: Ability to demonstrate positive interaction with newborn will improve 09/06/2023 1050 by Donne Hazel, LPN Outcome: Adequate for Discharge 09/06/2023 1017 by Donne Hazel, LPN Outcome: Progressing   Problem: Skin Integrity: Goal: Demonstration of wound healing without infection will improve 09/06/2023 1050 by Donne Hazel, LPN Outcome: Adequate for Discharge 09/06/2023 1017 by Donne Hazel, LPN Outcome: Progressing

## 2023-09-09 ENCOUNTER — Inpatient Hospital Stay (HOSPITAL_COMMUNITY): Payer: Medicaid Other

## 2023-09-09 ENCOUNTER — Other Ambulatory Visit: Payer: Self-pay

## 2023-09-09 ENCOUNTER — Inpatient Hospital Stay (HOSPITAL_COMMUNITY)
Admission: AD | Admit: 2023-09-09 | Discharge: 2023-09-09 | Disposition: A | Discharge status: Home / Self Care | Payer: Medicaid Other | Attending: Family Medicine | Admitting: Family Medicine

## 2023-09-09 ENCOUNTER — Encounter (HOSPITAL_COMMUNITY): Payer: Self-pay | Admitting: Family Medicine

## 2023-09-09 DIAGNOSIS — O9089 Other complications of the puerperium, not elsewhere classified: Secondary | ICD-10-CM | POA: Diagnosis not present

## 2023-09-09 DIAGNOSIS — O165 Unspecified maternal hypertension, complicating the puerperium: Secondary | ICD-10-CM | POA: Diagnosis not present

## 2023-09-09 DIAGNOSIS — O99335 Smoking (tobacco) complicating the puerperium: Secondary | ICD-10-CM | POA: Insufficient documentation

## 2023-09-09 LAB — CBC
HCT: 33.6 % — ABNORMAL LOW (ref 36.0–46.0)
Hemoglobin: 10.7 g/dL — ABNORMAL LOW (ref 12.0–15.0)
MCH: 25.4 pg — ABNORMAL LOW (ref 26.0–34.0)
MCHC: 31.8 g/dL (ref 30.0–36.0)
MCV: 79.6 fL — ABNORMAL LOW (ref 80.0–100.0)
Platelets: 274 10*3/uL (ref 150–400)
RBC: 4.22 MIL/uL (ref 3.87–5.11)
RDW: 14.6 % (ref 11.5–15.5)
WBC: 10 10*3/uL (ref 4.0–10.5)
nRBC: 0 % (ref 0.0–0.2)

## 2023-09-09 LAB — COMPREHENSIVE METABOLIC PANEL
ALT: 16 U/L (ref 0–44)
AST: 19 U/L (ref 15–41)
Albumin: 2.4 g/dL — ABNORMAL LOW (ref 3.5–5.0)
Alkaline Phosphatase: 84 U/L (ref 38–126)
Anion gap: 9 (ref 5–15)
BUN: 9 mg/dL (ref 6–20)
CO2: 24 mmol/L (ref 22–32)
Calcium: 9 mg/dL (ref 8.9–10.3)
Chloride: 105 mmol/L (ref 98–111)
Creatinine, Ser: 0.61 mg/dL (ref 0.44–1.00)
GFR, Estimated: >60 mL/min (ref >60)
Glucose, Bld: 96 mg/dL (ref 70–99)
Potassium: 4 mmol/L (ref 3.5–5.1)
Sodium: 138 mmol/L (ref 135–145)
Total Bilirubin: 0.3 mg/dL (ref 0.3–1.2)
Total Protein: 5.8 g/dL — ABNORMAL LOW (ref 6.5–8.1)

## 2023-09-09 MED ORDER — NIFEDIPINE ER 30 MG PO TB24: 30.0000 mg | ORAL_TABLET | Freq: Every day | ORAL | 1 refills | Status: DC

## 2023-09-09 MED ORDER — NIFEDIPINE 10 MG PO CAPS
10.0000 mg | ORAL_CAPSULE | Freq: Once | ORAL | Status: AC
  Administered 2023-09-09: 10 mg via ORAL
  Filled 2023-09-09: qty 1

## 2023-09-09 NOTE — MAU Provider Note (Signed)
History     CSN: [id number]  Arrival date and time: 09/09/23 0038   None     Chief Complaint  Patient presents with   Hypertension   Headache   Judy Miller is a 27 y.o. G2P2002 at Unknown who receives care at [facility].  Patient reports next appt is Oct 8th. She presents today for headache and hypertension.  She states she had a HA that started at 6pm and was mild. She states as the night progressed the HA worsened.  She reports taking ibuprofen at 1150 that improved her HA.  She states upon arrival the HA was a 8/10, but has since resolved. She denies visual disturbances.  She states because of the HA she took her blood pressure and noted it was elevated (160/92 and 146/98).  She denies blood pressure issues during the pregnancy.    OB History     Gravida  2   Para  2   Term  2   Preterm  0   AB  0   Living  2      SAB  0   IAB  0   Ectopic  0   Multiple  0   Live Births  2           Past Medical History:  Diagnosis Date   Acne vulgaris 05/12/2015   ADHD (attention deficit hyperactivity disorder)    ADHD (attention deficit hyperactivity disorder)    Anxiety    Anxiety and depression 05/12/2015   Fluoxetine 10mg  did not help with symptom control. Venlafaxine 37.5mg  two po qday did not help much with symptoms control.   Arthritis    history of this in her knees   Depression    Depression, major, single episode, mild (HCC) 04/23/2013   Generalized anxiety disorder 11/06/2013   Generalized anxiety disorder 11/06/2013   Genital herpes 11/2017   pos HSV 2 culture   Gestational hypertension, third trimester 06/13/2021   History of nephrolithiasis 03/07/2021   Postpartum anxiety 07/27/2021   Late July - started Zoloft 50mg    Renal stone 01/21/2019   Rhinitis, allergic 11/10/2015   S/P primary low transverse C-section 06/16/2021   Tachycardia 06/15/2021   Vaccine for human papilloma virus (HPV) types 6, 11, 16, and 18 administered     Past  Surgical History:  Procedure Laterality Date   CESAREAN SECTION N/A 06/16/2021   Procedure: CESAREAN SECTION;  Surgeon: Federico Flake, MD;  Location: [facility] LD ORS;  Service: Obstetrics;  Laterality: N/A;    Family History  Problem Relation Age of Onset   Arthritis Mother    Varicose Veins Maternal Grandmother    Heart disease Paternal Grandmother    Varicose Veins Paternal Grandmother     Social History   Tobacco Use   Smoking status: Every Day    Types: E-cigarettes   Smokeless tobacco: Never  Vaping Use   Vaping status: Every Day   Substances: Nicotine  Substance Use Topics   Alcohol use: Not Currently    Comment: barely   Drug use: No    Comment: history of     Allergies:  Allergies  Allergen Reactions   Fish Allergy Hives    Grouper specifically   Other Hives    Red hair dye    Shellfish Allergy Hives    Crab legs, specifically     Medications Prior to Admission  Medication Sig Dispense Refill Last Dose   ibuprofen (ADVIL) 600 MG tablet Take 1 tablet (  600 mg total) by mouth every 6 (six) hours. 30 tablet 0 09/08/2023 at 2355   Prenatal Vit-Fe Fumarate-FA (PRENATAL PO) Take 1 tablet by mouth daily.   09/08/2023   aspirin EC 81 MG tablet Take 1 tablet (81 mg total) by mouth daily. Take after 12 weeks for prevention of preeclampsia later in pregnancy 300 tablet 2    busPIRone (BUSPAR) 10 MG tablet Take 1 tablet (10 mg total) by mouth 2 (two) times daily. (Patient not taking: Reported on 09/04/2023) 60 tablet 2    hydrOXYzine (ATARAX) 25 MG tablet Take 1 tablet (25 mg total) by mouth every 6 (six) hours as needed for anxiety. (Patient not taking: Reported on 09/04/2023) 30 tablet 2    valACYclovir (VALTREX) 1000 MG tablet Take 1 tablet (1,000 mg total) by mouth daily. 30 tablet 2     Review of Systems  Gastrointestinal:  Negative for abdominal pain, nausea and vomiting.  Genitourinary:  Positive for vaginal bleeding. Negative for difficulty urinating and  dysuria.  Neurological:  Positive for headaches. Negative for dizziness and light-headedness.   Physical Exam   Blood pressure 112/76, pulse 63, temperature 97.6 F (36.4 C), temperature source Oral, resp. rate 17, height 5\' 6"  (1.676 m), weight 84.1 kg, SpO2 100%, unknown if currently breastfeeding.  Physical Exam Constitutional:      Appearance: Normal appearance. She is well-developed.  HENT:     Head: Normocephalic and atraumatic.  Eyes:     Conjunctiva/sclera: Conjunctivae normal.  Cardiovascular:     Rate and Rhythm: Normal rate.  Pulmonary:     Effort: Pulmonary effort is normal. No respiratory distress.  Musculoskeletal:        General: Normal range of motion.     Cervical back: Normal range of motion.  Skin:    General: Skin is warm and dry.  Neurological:     Mental Status: She is alert and oriented to person, place, and time.  Psychiatric:        Mood and Affect: Mood normal.        Behavior: Behavior normal.     MAU Course  Procedures Results for orders placed or performed during the hospital encounter of 09/09/23 (from the past 24 hour(s))  Comprehensive metabolic panel     Status: Abnormal   Collection Time: 09/09/23  1:11 AM  Result Value Ref Range   Sodium 138 135 - 145 mmol/L   Potassium 4.0 3.5 - 5.1 mmol/L   Chloride 105 98 - 111 mmol/L   CO2 24 22 - 32 mmol/L   Glucose, Bld 96 70 - 99 mg/dL   BUN 9 6 - 20 mg/dL   Creatinine, Ser 4.54 0.44 - 1.00 mg/dL   Calcium 9.0 8.9 - 09.8 mg/dL   Total Protein 5.8 (L) 6.5 - 8.1 g/dL   Albumin 2.4 (L) 3.5 - 5.0 g/dL   AST 19 15 - 41 U/L   ALT 16 0 - 44 U/L   Alkaline Phosphatase 84 38 - 126 U/L   Total Bilirubin 0.3 0.3 - 1.2 mg/dL   GFR, Estimated >11 >91 mL/min   Anion gap 9 5 - 15  CBC     Status: Abnormal   Collection Time: 09/09/23  1:11 AM  Result Value Ref Range   WBC 10.0 4.0 - 10.5 K/uL   RBC 4.22 3.87 - 5.11 MIL/uL   Hemoglobin 10.7 (L) 12.0 - 15.0 g/dL   HCT 47.8 (L) 29.5 - 62.1 %   MCV  79.6 (L)  80.0 - 100.0 fL   MCH 25.4 (L) 26.0 - 34.0 pg   MCHC 31.8 30.0 - 36.0 g/dL   RDW 16.1 09.6 - 04.5 %   Platelets 274 150 - 400 K/uL   nRBC 0.0 0.0 - 0.2 %    MDM Labs: CBC, CMP Antihypertensive Prescription Assessment and Plan  27 year old Postpartum State Postpartum Hypertension  -Labs and Procardia 10 ordered while patient in triage. -Results as above. -BP with improvement. -Provider to bedside. -Reviewed findings and recommendation for medications. -Patient agreeable. -Rx for Adalact 30mg  sent to pharmacy on file.  -Discussed follow up as scheduled on Oct 8th.  -Precautions reviewed. -Encouraged to call primary office or return to MAU if symptoms worsen or with the onset of new symptoms. -Discharged to home in stable condition.   Joyce Copa Cody Albus 09/09/2023, 2:06 AM

## 2023-09-09 NOTE — MAU Note (Signed)
.  Judy Miller is a 27 y.o. at Unknown here in MAU reporting: didn't get a lot of sleep last night, cleaned all day - started getting HA. Took BP - 160/92 and 146/98. Took ibuprofen at 2355 and tylenol earlier - unsure of the time. States HA will intensify and then slightly go away. Denies visual disturbances or epigastric pain.   Onset of complaint: 1800 Pain score: 4 Vitals:   09/09/23 0051  BP: (!) 138/91  Pulse: 68  Resp: 18  Temp: 97.6 F (36.4 C)  SpO2: 99%      Lab orders placed from triage:  none

## 2023-09-11 ENCOUNTER — Ambulatory Visit (INDEPENDENT_AMBULATORY_CARE_PROVIDER_SITE_OTHER): Payer: Medicaid Other

## 2023-09-11 DIAGNOSIS — Z0130 Encounter for examination of blood pressure without abnormal findings: Secondary | ICD-10-CM

## 2023-09-11 NOTE — Progress Notes (Unsigned)
Subjective:  Judy Miller is a 27 y.o. female here for BP check.   Hypertension ROS: Patient denies any headaches, visual symptoms, RUQ/epigastric pain or other concerning symptoms.  Objective:  There were no vitals taken for this visit.  Appearance alert, well appearing, and in no distress. General exam BP noted to be 135/90 today in office.    Assessment:   Blood Pressure {disease control degree:315147}.   Plan:  {disease follow up plans:315730}.    Bevely Hackbart  [id number] here for postpartum depression screen. Pt is currently 1 weeks postpartum. Pt reports doing happy.      Discussed with provider results of [place] 0.  Pt to follow up As scheduled for PPV .

## 2023-09-26 ENCOUNTER — Encounter: Payer: Self-pay | Admitting: Family Medicine

## 2023-09-27 ENCOUNTER — Encounter: Payer: Self-pay | Admitting: Obstetrics and Gynecology

## 2023-09-27 ENCOUNTER — Ambulatory Visit: Payer: Medicaid Other | Admitting: Obstetrics and Gynecology

## 2023-09-27 VITALS — BP 119/81 | HR 97 | Wt 169.4 lb

## 2023-09-27 DIAGNOSIS — N9089 Other specified noninflammatory disorders of vulva and perineum: Secondary | ICD-10-CM

## 2023-09-27 DIAGNOSIS — O165 Unspecified maternal hypertension, complicating the puerperium: Secondary | ICD-10-CM | POA: Diagnosis not present

## 2023-09-27 NOTE — Progress Notes (Signed)
[id number] delivery 09/04/23  1st degree lac  Swollen Area on Labia  No head on it  Denies any color change  Denies any heat coming from it

## 2023-10-01 NOTE — Progress Notes (Addendum)
Obstetrics and Gynecology Visit Return Patient Evaluation  Appointment Date: 09/27/2023  Primary Care Provider: Doreene Nest  OBGYN Clinic: [facility]  Chief Complaint: swollen labia s/p 10/1 VBAC/1st degree (repaired)  History of Present Illness:  Judy Miller is a 27 y.o. with above CC. PMHx significant for h/o HSV, h/o palpitations on no meds. Patient asymptomatic today but thought she felt a bump at the lower introitus/posterior fourchette. Patient denies any s/s of pre-eclampsia. Lochia wnl.   Review of Systems: as noted in the History of Present Illness.  Patient Active Problem List   Diagnosis Date Noted   Post term pregnancy over 40 weeks 09/04/2023   Breech presentation 08/09/2023   Breech--> ECV 9/5 successful 08/07/2023   History of gestational hypertension 08/07/2023   Previous cesarean delivery affecting pregnancy, antepartum 07/04/2023   Supervision of high risk pregnancy, antepartum 02/22/2023   History of herpes genitalis 12/13/2020   Kidney stones 01/21/2019   Medications:  Brendia Soler. Karl had no medications administered during this visit. Current Outpatient Medications  Medication Sig Dispense Refill   ibuprofen (ADVIL) 600 MG tablet Take 1 tablet (600 mg total) by mouth every 6 (six) hours. 30 tablet 0   NIFEdipine (ADALAT CC) 30 MG 24 hr tablet Take 1 tablet (30 mg total) by mouth daily. 30 tablet 1   Prenatal Vit-Fe Fumarate-FA (PRENATAL PO) Take 1 tablet by mouth daily.     valACYclovir (VALTREX) 1000 MG tablet Take 1 tablet (1,000 mg total) by mouth daily. 30 tablet 2   No current facility-administered medications for this visit.    Allergies: is allergic to fish allergy, other, and shellfish allergy.  Physical Exam:  1st BP 140/97 BP 119/81   Pulse 97   Wt 169 lb 6.4 oz (76.8 kg)   BMI 27.34 kg/m  Body mass index is 27.34 kg/m. General appearance: Well nourished, well developed female in no acute  distress.  Abdomen: soft, nttp Neuro/Psych:  Normal mood and affect.    Pelvic exam:  Cervical exam performed in the presence of a chaperone EGBUS: wnl Vagina: at the introitus is well healing area just past the posterior introitus. No s/s of infection, separation; area nttp  Assessment: patient doing well  Plan:  1. Postpartum hypertension Continue to follow. Pt to check BPs at home  2. Labia irritation Unsure of etiology but healing well  3. Postpartum care and examination Follow up for routine visit in a few weeks.    Future Appointments  Date Time Provider Department Center  10/03/2023  3:00 PM Thomasene Ripple, DO [facility] None  10/17/2023  1:50 PM Reva Bores, MD [facility]    Cornelia Copa MD Attending [facility])

## 2023-10-03 ENCOUNTER — Ambulatory Visit: Payer: Medicaid Other | Attending: Cardiology | Admitting: Cardiology

## 2023-10-17 ENCOUNTER — Ambulatory Visit: Payer: Medicaid Other

## 2023-10-17 ENCOUNTER — Encounter: Payer: Self-pay | Admitting: Family Medicine

## 2023-10-17 ENCOUNTER — Ambulatory Visit: Payer: Medicaid Other | Admitting: Family Medicine

## 2023-10-17 VITALS — BP 111/77 | HR 88 | Wt 166.0 lb

## 2023-10-17 DIAGNOSIS — Z720 Tobacco use: Secondary | ICD-10-CM | POA: Diagnosis not present

## 2023-10-17 DIAGNOSIS — Z30430 Encounter for insertion of intrauterine contraceptive device: Secondary | ICD-10-CM | POA: Diagnosis not present

## 2023-10-17 DIAGNOSIS — M0609 Rheumatoid arthritis without rheumatoid factor, multiple sites: Secondary | ICD-10-CM | POA: Diagnosis not present

## 2023-10-17 DIAGNOSIS — Z8759 Personal history of other complications of pregnancy, childbirth and the puerperium: Secondary | ICD-10-CM

## 2023-10-17 LAB — POCT URINE PREGNANCY: Preg Test, Ur: NEGATIVE

## 2023-10-17 MED ORDER — BUPROPION HCL ER (SR) 150 MG PO TB12
150.0000 mg | ORAL_TABLET | Freq: Two times a day (BID) | ORAL | 2 refills | Status: DC
Start: 2023-10-17 — End: 2024-04-29

## 2023-10-17 MED ORDER — PARAGARD INTRAUTERINE COPPER IU IUD
1.0000 | INTRAUTERINE_SYSTEM | Freq: Once | INTRAUTERINE | Status: AC
  Administered 2023-10-17: 1 via INTRAUTERINE

## 2023-10-17 NOTE — Assessment & Plan Note (Signed)
Start taking nifedipine every other day, and then stop if BP remains ok.

## 2023-10-17 NOTE — Assessment & Plan Note (Addendum)
[id number] - Discussed that she should consider f/u with rheumatology, reviewed long term consequences.

## 2023-10-17 NOTE — Progress Notes (Signed)
Post Partum Visit Note  Judy Miller is a 27 y.o. [id number] female who presents for a postpartum visit. She is 6 weeks postpartum following a VBAC.  I have fully reviewed the prenatal and intrapartum course. The delivery was at 40 gestational weeks.  Anesthesia: epidural. Postpartum course has been uncomplicated. Baby is doing well. Baby is feeding by breast. Bleeding no bleeding. Bowel function is normal. Bladder function is normal. Patient is not sexually active. Contraception method is none. Postpartum depression screening: positive.   The pregnancy intention screening data noted above was reviewed. Potential methods of contraception were discussed. The patient elected to proceed with No data recorded.   Edinburgh Postnatal Depression Scale - 10/17/23 1406       Edinburgh Postnatal Depression Scale:  In the Past 7 Days   I have been able to laugh and see the funny side of things. 0    I have looked forward with enjoyment to things. 0    I have blamed myself unnecessarily when things went wrong. 1    I have been anxious or worried for no good reason. 2    I have felt scared or panicky for no good reason. 2    Things have been getting on top of me. 1    I have been so unhappy that I have had difficulty sleeping. 0    I have felt sad or miserable. 0    I have been so unhappy that I have been crying. 0    The thought of harming myself has occurred to me. 0    Edinburgh Postnatal Depression Scale Total 6             Health Maintenance Due  Topic Date Due   INFLUENZA VACCINE  Never done   COVID-19 Vaccine (1 - 2023-24 season) Never done   Cervical Cancer Screening (Pap smear)  12/14/2023    The following portions of the patient's history were reviewed and updated as appropriate: allergies, current medications, past family history, past medical history, past social history, past surgical history, and problem list.  Review of Systems Pertinent items noted in HPI and  remainder of comprehensive ROS otherwise negative.  Objective:  BP 111/77   Pulse 88   Wt 166 lb (75.3 kg)   LMP 12/04/2022   Breastfeeding Yes   BMI 26.79 kg/m    General:  alert, cooperative, and appears stated age   Breasts:  not indicated  Lungs: Normal effort  Heart:  regular rate and rhythm  Abdomen: soft, non-tender; bowel sounds normal; no masses,  no organomegaly   GU exam:  not indicated       Assessment:   Normal postpartum exam.   Plan:   Essential components of care per ACOG recommendations:  1.  Mood and well being: Patient with negative depression screening today. Reviewed local resources for support.  - Patient tobacco use? Yes. Patient desires to quit? Yes.Discussed reduction and cessation  - hx of drug use? No.    2. Infant care and feeding:  -Patient currently breastmilk feeding? Yes. Reviewed importance of draining breast regularly to support lactation.  -Social determinants of health (SDOH) reviewed in EPIC. No concerns  3. Sexuality, contraception and birth spacing - Patient does not want a pregnancy in the next year.  Desired family size is 2 children.  - Reviewed reproductive life planning. Reviewed contraceptive methods based on pt preferences and effectiveness.  Patient desired Oral Contraceptive today.   - Discussed  birth spacing of 18 months  4. Sleep and fatigue -Encouraged family/partner/community support of 4 hrs of uninterrupted sleep to help with mood and fatigue  5. Physical Recovery  - Discussed patients delivery and complications. She describes her labor as good. - Patient had a Vaginal, no problems at delivery. Patient had a 1st degree laceration. Perineal healing reviewed. Patient expressed understanding - Patient has urinary incontinence? No. - Patient is safe to resume physical and sexual activity  6.  Health Maintenance - HM due items addressed Yes - Last pap smear  Diagnosis  Date Value Ref Range Status  12/13/2020   Final    - Negative for intraepithelial lesion or malignancy (NILM)   Pap smear not done at today's visit.  -Breast Cancer screening indicated? No.   7. Chronic Disease/Pregnancy Condition follow up: Hypertension On Nifedipine with excellent control. - PCP follow up Problem List Items Addressed This Visit       Unprioritized   History of gestational hypertension    Start taking nifedipine every other day, and then stop if BP remains ok.      Rheumatoid arthritis of multiple sites with negative rheumatoid factor (HCC)    Discussed that she should consider f/u with rheumatology, reviewed long term consequences.      Tobacco use - Primary    Discussed options. Trial of Wellbutrin, written and verbal instructions on how to quit smoking done.      Relevant Medications   buPROPion (WELLBUTRIN SR) 150 MG 12 hr tablet   Other Visit Diagnoses     Encounter for IUD insertion       Relevant Medications   paragard intrauterine copper IUD 1 each (Completed) (Start on 10/17/2023  3:15 PM)   Other Relevant Orders   POCT urine pregnancy (Completed)   Postpartum care and examination            Reva Bores, MD [facility], [facility]

## 2023-10-17 NOTE — Patient Instructions (Signed)
Congratulations on wanting to quit smoking! I am so happy to hear this! Here are some suggested steps to help make this process easier: 1. Set a quit date - usually 2-4-6 wks in the future - gives you time to mentally prepare 2. Enlist the help of someone - it is easier with a cheerleader 3. Think about your triggers - do you like to have it after meals, first thing in the morning, etc. 4. Write down a list of alternatives to vaping - hopefully not involving consuming more calories     A. Making a quick phone call - bonus, reconnecting with someone you care about     B. Chewing a piece of sugar-free gum     C. Drinking a large glass of water - bonus, you need to sty hydrated while pregnant     D. Taking a 10 minute walk - bonus, getting healthy, burning calories, improves mood 5. Put the list where you can see it and quickly use it when the cravings hit 6. Write a list of why you should quit     A. Your baby will thank you         B. Your lungs will thank you - they start to improve very quickly     C. Your pocketbook will thank you - actually write down what you spend on vapes/wk or month and start saving that money--at a bank or at home in a jar. Do the quick math, and you can probably save enough money in 1 year for a cool vacation or something special you have your eye on.     D. Insert your reasons here 7. Post this list where you can see it if your motivation wanes 8. Consider reducing how many times you vape daily. Reduce the amount of vaping you do every day  It is a hard habit to break, but I know you can do this!!! I hope this helps. Let me know if you have other questions.

## 2023-10-17 NOTE — Assessment & Plan Note (Addendum)
[id number] - Discussed options. Trial of Wellbutrin, written and verbal instructions on how to quit smoking done.

## 2023-11-07 ENCOUNTER — Ambulatory Visit: Payer: Medicaid Other | Admitting: Family Medicine

## 2024-01-24 ENCOUNTER — Encounter: Payer: Medicaid Other | Admitting: Primary Care

## 2024-01-28 ENCOUNTER — Ambulatory Visit: Payer: Self-pay | Admitting: Primary Care

## 2024-01-28 NOTE — Telephone Encounter (Signed)
 Noted, will evaluate.

## 2024-01-28 NOTE — Telephone Encounter (Signed)
 Chief Complaint: Anxiety Symptoms: Anxiety Frequency: Off and on Pertinent Negatives: Patient denies suicidal ideation, homicidal ideation  Disposition: [] ED /[] Urgent Care (no appt availability in office) / [x] Appointment(In office/virtual)/ []  [place] [facility]/ [] Home Care/ [] Refused Recommended Disposition /[] [place] [facility]/ []  Follow-up with PCP Additional Notes: Patient called with complaints of worsening anxiety. Patient states she has a history of anxiety since high school but was at its worse after having her first daughter 3 years ago where she was put on Zoloft. Patient states she has just had her second daughter and her symptoms have started again, where off and on daily she experiences health-related anxiety from hypochondria-type thoughts. Patient gives examples that she is having extreme fears over dying, and will become anxious over things like drinking coffee, saying, "Maybe I'm drinking too much caffeine and this is going to make me have a heart attack." Patient states that she had pain in her chest after going to the chiropractor and thought she was having a heart attack. Patient states she was seen in the ER for fears of heart attack, and the cardiac work up was negative. Patient states that she knows she is making herself anxious with fear over health issues that are not truly there but still has trouble controlling her thoughts to not become anxious. Patient states she is also getting over being sick and is nervous that she could be having lingering effects that could cause more symptoms. Patient has family and boyfriend for support system, and is also unsure if post-partum depression could be worsening symptoms. Patient states, "I have hydroxyzine left over and I took that but I dont think it's really helping." Patient states that she is okay now during triage call, but wanted to make an appointment since her anxiety is interfering with daily activities. Patient denies  homicidal/suicidal ideation. Patient advised by this RN to be seen within the next three says per protocol, to which patient was agreeable. Appt Scheduled. Patient advised by this RN to call back with worsening symptoms. Patient verbalized understanding  Copied from CRM [id number]. Topic: Clinical - Red Word Triage >> Jan 28, 2024  1:34 PM Denese Killings wrote: Red Word that prompted transfer to Nurse Triage: Patient is having really bad anxiety. She states that she is postpartum as well. Reason for Disposition  MODERATE anxiety (e.g., persistent or frequent anxiety symptoms; interferes with sleep, school, or work)  Answer Assessment - Initial Assessment Questions 1. CONCERN: "Did anything happen that prompted you to call today?"      5 months postpartum 2. ANXIETY SYMPTOMS: "Can you describe how you (your loved one; patient) have been feeling?" (e.g., tense, restless, panicky, anxious, keyed up, overwhelmed, sense of impending doom).      Health anxiety "I've been going to chiropractor and I have been getting  3. ONSET: "How long have you been feeling this way?" (e.g., hours, days, weeks)     Wednesday  4. SEVERITY: "How would you rate the level of anxiety?" (e.g., 0 - 10; or mild, moderate, severe).     "I'm actually okay right now, I was calling to reschedule my physical" 5. FUNCTIONAL IMPAIRMENT: "How have these feelings affected your ability to do daily activities?" "Have you had more difficulty than usual doing your normal daily activities?" (e.g., getting better, same, worse; self-care, school, work, interactions)     It's getting worse  6. HISTORY: "Have you felt this way before?" "Have you ever been diagnosed with an anxiety problem in the past?" (  e.g., generalized anxiety disorder, panic attacks, PTSD). If Yes, ask: "How was this problem treated?" (e.g., medicines, counseling, etc.)     "I've had it since high school and then after I had my first daughter it was really bad and I got put on  Zoloft 7. RISK OF HARM - SUICIDAL IDEATION: "Do you ever have thoughts of hurting or killing yourself?" If Yes, ask:  "Do you have these feelings now?" "Do you have a plan on how you would do this?"     Denies 8. TREATMENT:  "What has been done so far to treat this anxiety?" (e.g., medicines, relaxation strategies). "What has helped?"     Hydroxine left 9. TREATMENT - THERAPIST: "Do you have a counselor or therapist? Name?"     Denies 10. POTENTIAL TRIGGERS: "Do you drink caffeinated beverages (e.g., coffee, colas, teas), and how much daily?" "Do you drink alcohol or use any drugs?" "Have you started any new medicines recently?"        11. PATIENT SUPPORT: "Who is with you now?" "Who do you live with?" "Do you have family or friends who you can talk to?"        Denies 12. OTHER SYMPTOMS: "Do you have any other symptoms?" (e.g., feeling depressed, trouble concentrating, trouble sleeping, trouble breathing, palpitations or fast heartbeat, chest pain, sweating, nausea, or diarrhea)       Depression, brain fog. 13. PREGNANCY: "Is there any chance you are pregnant?" "When was your last menstrual period?"       Denies  Protocols used: Anxiety and Panic Attack-A-AH

## 2024-01-30 ENCOUNTER — Encounter: Payer: Self-pay | Admitting: *Deleted

## 2024-01-30 ENCOUNTER — Encounter: Payer: Self-pay | Admitting: Primary Care

## 2024-01-30 ENCOUNTER — Ambulatory Visit (INDEPENDENT_AMBULATORY_CARE_PROVIDER_SITE_OTHER): Payer: Medicaid Other | Admitting: Primary Care

## 2024-01-30 VITALS — BP 136/72 | HR 102 | Temp 98.1°F | Ht 66.0 in | Wt 160.0 lb

## 2024-01-30 DIAGNOSIS — F411 Generalized anxiety disorder: Secondary | ICD-10-CM

## 2024-01-30 DIAGNOSIS — R221 Localized swelling, mass and lump, neck: Secondary | ICD-10-CM | POA: Insufficient documentation

## 2024-01-30 DIAGNOSIS — Z720 Tobacco use: Secondary | ICD-10-CM

## 2024-01-30 MED ORDER — SERTRALINE HCL 50 MG PO TABS: 50.0000 mg | ORAL_TABLET | Freq: Every day | ORAL | 0 refills | Status: DC

## 2024-01-30 NOTE — Progress Notes (Signed)
 Acute Office Visit  Subjective:     Patient ID: Judy Miller, female    DOB: 1996/08/30, 28 y.o.   MRN: [id number]  Chief Complaint  Patient presents with   Anxiety    HPI Judy Miller is a 28 year old female who is postpartum 5 months ago. She has a history of tobacco use, gestational hypertension, GAD who presents today to discuss 8 day history of worsening anxiety.   Anxiety  Symptom onset 8 days ago with heart palpitations, chest pains, shortness of breath described as tense. Notes brain fog, dizziness, and irritability. She expresses concerns that she may have OCD. Denies SI/HI. Over the last year, she has felt anxious at baseline, which has worsened post-partum and with current living situation with family.   She has a long history of anxiety, previously treated with sertraline, venlafaxine, fluoxetine, and hydroxyzine. She has hydroxyzine PRN at home that she has used PRN over the last year without symptomatic relief. She was prescribed Buspirone 10mg  during pregnancy but did not begin treatment.   She is currently vaping, would like to quit. Was prescribed Wellbutrin for smoking cessation, prescribed by OB-Gyn, but did not begin treatment.  She is no longer breastfeeding.  She was referred to cardiology for work up of palpitations, dyspnea, atypical chest pain. Was evaluated by Dr. Servando Salina with [facility] 07/2023.  Echocardiogram:  LVEF 60-65%, no regional wall abnormalities, normal diastolic parameters, trivial MR without stenosis, tricuspid AV without regurgitation or stenosis ZIO monitor:  SR/ST with HR 65-166bpm  2. Localized lump of left neck  Onset 3-4 days ago, with itching this morning. No pain, tenderness, erythema. No recent URI or acute illness.     Flowsheet Row Office Visit from 01/30/2024 in [facility] [place]  PHQ-9 Total Score 8        Edinburgh Postnatal Depression Scale - 01/30/24 1230       Edinburgh  Postnatal Depression Scale:  In the Past 7 Days   I have been able to laugh and see the funny side of things. 0    I have looked forward with enjoyment to things. 0    I have blamed myself unnecessarily when things went wrong. 1    I have been anxious or worried for no good reason. 3    I have felt scared or panicky for no good reason. 3    Things have been getting on top of me. 2    I have been so unhappy that I have had difficulty sleeping. 0    I have felt sad or miserable. 2    I have been so unhappy that I have been crying. 0    The thought of harming myself has occurred to me. 0    Edinburgh Postnatal Depression Scale Total 11                01/30/2024   12:39 PM 01/30/2024   12:26 PM 07/30/2023    3:50 PM 06/06/2023    9:35 AM  GAD 7 : Generalized Anxiety Score  Nervous, Anxious, on Edge 3 3 3 3   Control/stop worrying 3 3 3 3   Worry too much - different things 3 3 3 3   Trouble relaxing 0 2 2 2   Restless 0 0 0 0  Easily annoyed or irritable 2 3 3 3   Afraid - awful might happen 3 3 3 3   Total GAD 7 Score 14 17 17 17   Anxiety Difficulty Extremely difficult  Extremely difficult        Review of Systems  Respiratory:  Positive for shortness of breath.   Cardiovascular:  Positive for chest pain and palpitations.  Psychiatric/Behavioral:  Positive for depression. Negative for suicidal ideas. The patient is nervous/anxious.         Objective:    BP 136/72   Pulse (!) 102   Temp 98.1 F (36.7 C) (Temporal)   Ht 5\' 6"  (1.676 m)   Wt 72.6 kg   LMP 01/16/2024 (Exact Date)   SpO2 99%   Breastfeeding No   BMI 25.82 kg/m    Physical Exam Constitutional:      General: She is not in acute distress.    Appearance: Normal appearance.  Neck:     Trachea: Trachea normal.     Comments: Localized lump mid-distal left neck, along deep cervical lympathic chain, non-tender to palpation Cardiovascular:     Rate and Rhythm: Normal rate and regular rhythm.     Pulses: Normal  pulses.     Heart sounds: Normal heart sounds.  Pulmonary:     Effort: Pulmonary effort is normal.     Breath sounds: Normal breath sounds.  Musculoskeletal:        General: Normal range of motion.     Cervical back: Normal range of motion and neck supple.  Neurological:     General: No focal deficit present.     Mental Status: She is alert and oriented to person, place, and time.  Psychiatric:        Attention and Perception: Attention normal.        Mood and Affect: Mood is anxious.        Speech: Speech normal.        Behavior: Behavior is cooperative.     No results found for any visits on 01/30/24.      Assessment & Plan:   Problem List Items Addressed This Visit       Other   Generalized anxiety disorder - Primary   Uncontrolled, per HPI On no current treatment options   Discussed initiation of SSRI (sertraline) which had been taken in the past, with addition of counseling/therapy. Patient in agreement with this plan.   Rx: sertraline 50mg  - take 1/2 tablet by mouth for 5-7 days then increase to full tablet.  Referral to psychology for counseling/therapy  Follow up in 4-6 weeks       Relevant Medications   sertraline (ZOLOFT) 50 MG tablet   Other Relevant Orders   Ambulatory referral to Psychology   Tobacco use   Currently vaping, with desire to quit  Was prescribed Wellbutrin, however did not begin treatment  Will postpone pharmacotherapeutic management/discussion of treatment options given initiation of SSRI today. Discussed with patient, in agreement with plan.       Localized swelling, mass or lump of neck   Etiology unclear  Plan for US soft tissue head/neck  R/O lymphadenopathy, lipoma, cyst  Order placed, results pending       Meds ordered this encounter  Medications   sertraline (ZOLOFT) 50 MG tablet    Sig: Take 1 tablet (50 mg total) by mouth daily. For anxiety    Dispense:  90 tablet    Refill:  0    Disposition:   Follow up  on Friday 2/28 for physical   Follow up in 4-6 weeks for medication review/review of symptoms    Lindell Spar, RN

## 2024-01-30 NOTE — Assessment & Plan Note (Addendum)
 Ready to quit.  Will postpone pharmacotherapeutic management/discussion of treatment options given initiation of SSRI today. Discussed with patient, in agreement with plan.  Consider Wellbutrin vs other options at next visit.  I evaluated patient, was consulted regarding treatment, and agree with assessment and plan per Julaine Fusi, MSN, FNP student.   Mayra Reel, NP-C

## 2024-01-30 NOTE — Assessment & Plan Note (Addendum)
 Uncontrolled.  Discussed initiation of SSRI (sertraline) which had been taken in the past, with addition of counseling/therapy. Patient in agreement with this plan.   Rx: sertraline 50mg  - take 1/2 tablet by mouth for 5-7 days then increase to full tablet.  Referral to psychology for counseling/therapy  Follow up in 4-6 weeks   I evaluated patient, was consulted regarding treatment, and agree with assessment and plan per Julaine Fusi, MSN, FNP student.   Mayra Reel, NP-C

## 2024-01-30 NOTE — Progress Notes (Signed)
 Subjective:    Patient ID: Judy Miller, female    DOB: Apr 24, 1996, 28 y.o.   MRN: [id number]  Anxiety Symptoms include nervous/anxious behavior, palpitations and shortness of breath.      Judy Miller is a very pleasant 28 y.o. female with a history of GAD, tobacco abuse, gestational hypertension who presents today to discuss anxiety and soft tissue mass.  1) GAD: Chronic history of anxiety for years. Previously managed on Zoloft, has not taken for 2-3 years. Since post partum 5 months ago her symptoms of anxiety have increased. Symptoms include irritability, worrying, thinking worst case scenario, feeling down/sad. She also believes she may have obsessive compulsive disorder. Her anxiety symptoms are constant, daily.   She was prescribed Wellbutrin by her Ob/GYN in November 2024 for smoking cessation assistance. She never picked this up from the pharmacy as she thought it as the same as her Buspar. Buspar was prescribed in July 2024 per Ob/GYN for anxiety, she never began taking this. She is not breast feeding.      01/30/2024   12:39 PM 01/30/2024   12:26 PM 07/30/2023    3:50 PM 06/06/2023    9:35 AM  GAD 7 : Generalized Anxiety Score  Nervous, Anxious, on Edge 3 3 3 3   Control/stop worrying 3 3 3 3   Worry too much - different things 3 3 3 3   Trouble relaxing 0 2 2 2   Restless 0 0 0 0  Easily annoyed or irritable 2 3 3 3   Afraid - awful might happen 3 3 3 3   Total GAD 7 Score 14 17 17 17   Anxiety Difficulty Extremely difficult Extremely difficult       Edinburgh Postnatal Depression Scale - 01/30/24 1230       Edinburgh Postnatal Depression Scale:  In the Past 7 Days   I have been able to laugh and see the funny side of things. 0    I have looked forward with enjoyment to things. 0    I have blamed myself unnecessarily when things went wrong. 1    I have been anxious or worried for no good reason. 3    I have felt scared or panicky for no good reason. 3     Things have been getting on top of me. 2    I have been so unhappy that I have had difficulty sleeping. 0    I have felt sad or miserable. 2    I have been so unhappy that I have been crying. 0    The thought of harming myself has occurred to me. 0    Edinburgh Postnatal Depression Scale Total 11            2) Soft Tissue Mass: Acute to the left anterior lower neck for the last 2 days. She denies pain, recent illness. She is under a lot of stress. Has uncontrolled anxiety that has become worse since postpartum 5 months ago.  She is very worried about her overall health, often believes she has a terminal illness.  She is worried the mass could be something bad.   Review of Systems  Respiratory:  Positive for shortness of breath.   Cardiovascular:  Positive for palpitations.  Psychiatric/Behavioral:  The patient is nervous/anxious.          Past Medical History:  Diagnosis Date   Acne vulgaris 05/12/2015   ADHD (attention deficit hyperactivity disorder)    ADHD (attention deficit hyperactivity disorder)  Anxiety    Anxiety and depression 05/12/2015   Fluoxetine 10mg  did not help with symptom control. Venlafaxine 37.5mg  two po qday did not help much with symptoms control.   Arthritis    history of this in her knees   Depression    Depression, major, single episode, mild (HCC) 04/23/2013   Generalized anxiety disorder 11/06/2013   Generalized anxiety disorder 11/06/2013   Genital herpes 11/2017   pos HSV 2 culture   Gestational hypertension, third trimester 06/13/2021   History of nephrolithiasis 03/07/2021   Postpartum anxiety 07/27/2021   Late July - started Zoloft 50mg    Renal stone 01/21/2019   Rhinitis, allergic 11/10/2015   S/P primary low transverse C-section 06/16/2021   Tachycardia 06/15/2021   Vaccine for human papilloma virus (HPV) types 6, 11, 16, and 18 administered     Social History   Socioeconomic History   Marital status: Single    Spouse name:  Not on file   Number of children: Not on file   Years of education: Not on file   Highest education level: Not on file  Occupational History   Not on file  Tobacco Use   Smoking status: Every Day    Types: E-cigarettes   Smokeless tobacco: Never  Vaping Use   Vaping status: Every Day   Substances: Nicotine  Substance and Sexual Activity   Alcohol use: Not Currently    Comment: barely   Drug use: No    Comment: history of    Sexual activity: Not Currently    Birth control/protection: None  Other Topics Concern   Not on file  Social History Narrative   Single.   Graduated from HS in 2017.    Student in cosmetology school.   Enjoys spending time outdoors, playing with her dog.   Social Drivers of Corporate investment banker Strain: Not on file  Food Insecurity: No Food Insecurity (09/04/2023)   Hunger Vital Sign    Worried About Running Out of Food in the Last Year: Never true    Ran Out of Food in the Last Year: Never true  Transportation Needs: No Transportation Needs (09/04/2023)   PRAPARE - Administrator, Civil Service (Medical): No    Lack of Transportation (Non-Medical): No  Physical Activity: Not on file  Stress: Not on file  Social Connections: Not on file  Intimate Partner Violence: Not At Risk (09/04/2023)   Humiliation, Afraid, Rape, and Kick questionnaire    Fear of Current or Ex-Partner: No    Emotionally Abused: No    Physically Abused: No    Sexually Abused: No    Past Surgical History:  Procedure Laterality Date   CESAREAN SECTION N/A 06/16/2021   Procedure: CESAREAN SECTION;  Surgeon: Federico Flake, MD;  Location: [facility];  Service: Obstetrics;  Laterality: N/A;    Family History  Problem Relation Age of Onset   Arthritis Mother    Varicose Veins Maternal Grandmother    Heart disease Paternal Grandmother    Varicose Veins Paternal Grandmother     Allergies  Allergen Reactions   Fish Allergy Hives    Grouper  specifically   Other Hives    Red hair dye    Shellfish Allergy Hives    Crab legs, specifically     Current Outpatient Medications on File Prior to Visit  Medication Sig Dispense Refill   buPROPion (WELLBUTRIN SR) 150 MG 12 hr tablet Take 1 tablet (150 mg total) by  mouth 2 (two) times daily. Take 1/2 tab daily for 5 days, then increase to 1/2 tab twice daily x 5 days then increase to 1 tab twice daily (Patient not taking: Reported on 01/30/2024) 90 tablet 2   NIFEdipine (ADALAT CC) 30 MG 24 hr tablet Take 1 tablet (30 mg total) by mouth daily. (Patient not taking: Reported on 01/30/2024) 30 tablet 1   Prenatal Vit-Fe Fumarate-FA (PRENATAL PO) Take 1 tablet by mouth daily. (Patient not taking: Reported on 01/30/2024)     valACYclovir (VALTREX) 1000 MG tablet Take 1 tablet (1,000 mg total) by mouth daily. (Patient not taking: Reported on 01/30/2024) 30 tablet 2   No current facility-administered medications on file prior to visit.    BP 136/72   Pulse (!) 102   Temp 98.1 F (36.7 C) (Temporal)   Ht 5\' 6"  (1.676 m)   Wt 160 lb (72.6 kg)   LMP 01/16/2024 (Exact Date)   SpO2 99%   Breastfeeding No   BMI 25.82 kg/m  Objective:   Physical Exam Neck:     Thyroid: No thyromegaly.      Comments: 1.5 cm superficial rounded soft mobile mass to left anterior lower neck. Non tender. Cardiovascular:     Rate and Rhythm: Normal rate and regular rhythm.  Pulmonary:     Effort: Pulmonary effort is normal.     Breath sounds: Normal breath sounds.  Musculoskeletal:     Cervical back: Neck supple.  Skin:    General: Skin is warm and dry.  Neurological:     Mental Status: She is alert and oriented to person, place, and time.  Psychiatric:        Mood and Affect: Mood normal.           Assessment & Plan:  Generalized anxiety disorder Assessment & Plan: Uncontrolled.  Discussed initiation of SSRI (sertraline) which had been taken in the past, with addition of counseling/therapy.  Patient in agreement with this plan.   Rx: sertraline 50mg  - take 1/2 tablet by mouth for 5-7 days then increase to full tablet.  Referral to psychology for counseling/therapy  Follow up in 4-6 weeks   I evaluated patient, was consulted regarding treatment, and agree with assessment and plan per Julaine Fusi, MSN, FNP student.   Mayra Reel, NP-C   Orders: -     Sertraline HCl; Take 1 tablet (50 mg total) by mouth daily. For anxiety  Dispense: 90 tablet; Refill: 0 -     Ambulatory referral to Psychology  Tobacco use Assessment & Plan: Ready to quit.  Will postpone pharmacotherapeutic management/discussion of treatment options given initiation of SSRI today. Discussed with patient, in agreement with plan.  Consider Wellbutrin vs other options at next visit.  I evaluated patient, was consulted regarding treatment, and agree with assessment and plan per Julaine Fusi, MSN, FNP student.   Mayra Reel, NP-C    Localized swelling, mass or lump of neck Assessment & Plan: Presentation today consistent for cervical lymph node.  Plan for US soft tissue head/neck to evaluate for lymphadenopathy, lipoma, cyst  Order placed, results pending  I evaluated patient, was consulted regarding treatment, and agree with assessment and plan per Julaine Fusi, MSN, FNP student.   Mayra Reel, NP-C   Orders: -     US SOFT TISSUE HEAD & NECK (NON-THYROID); Future        Doreene Nest, NP

## 2024-01-30 NOTE — Assessment & Plan Note (Addendum)
 Presentation today consistent for cervical lymph node.  Plan for US soft tissue head/neck to evaluate for lymphadenopathy, lipoma, cyst  Order placed, results pending  I evaluated patient, was consulted regarding treatment, and agree with assessment and plan per Julaine Fusi, MSN, FNP student.   Mayra Reel, NP-C

## 2024-01-30 NOTE — Patient Instructions (Addendum)
 Medications: START sertraline 50mg  tablets Take 1/2 tablet for 5-7 days, then increase to full tablet  Appointments: Follow up on Friday for annual physical exam   4-6 week follow up for anxiety   Referrals/Testing: You will get a message through MyChart about the therapist/counseling appointment.   You will get a call or message through MyChart about the neck ultrasound at [facility]

## 2024-02-01 ENCOUNTER — Encounter: Payer: Self-pay | Admitting: Primary Care

## 2024-02-01 ENCOUNTER — Ambulatory Visit (INDEPENDENT_AMBULATORY_CARE_PROVIDER_SITE_OTHER): Payer: Medicaid Other | Admitting: Primary Care

## 2024-02-01 VITALS — BP 122/84 | HR 68 | Temp 98.2°F | Ht 66.0 in | Wt 159.0 lb

## 2024-02-01 DIAGNOSIS — M0609 Rheumatoid arthritis without rheumatoid factor, multiple sites: Secondary | ICD-10-CM

## 2024-02-01 DIAGNOSIS — Z8619 Personal history of other infectious and parasitic diseases: Secondary | ICD-10-CM | POA: Diagnosis not present

## 2024-02-01 DIAGNOSIS — Z8759 Personal history of other complications of pregnancy, childbirth and the puerperium: Secondary | ICD-10-CM | POA: Diagnosis not present

## 2024-02-01 DIAGNOSIS — Z720 Tobacco use: Secondary | ICD-10-CM

## 2024-02-01 DIAGNOSIS — Z0000 Encounter for general adult medical examination without abnormal findings: Secondary | ICD-10-CM | POA: Diagnosis not present

## 2024-02-01 LAB — LIPID PANEL
Cholesterol: 178 mg/dL (ref 0–200)
HDL: 40.9 mg/dL (ref >39.00)
LDL Cholesterol: 108 mg/dL — ABNORMAL HIGH (ref 0–99)
NonHDL: 136.87
Total CHOL/HDL Ratio: 4
Triglycerides: 144 mg/dL (ref 0.0–149.0)
VLDL: 28.8 mg/dL (ref 0.0–40.0)

## 2024-02-01 LAB — CBC
HCT: 40.4 % (ref 36.0–46.0)
Hemoglobin: 13.6 g/dL (ref 12.0–15.0)
MCHC: 33.7 g/dL (ref 30.0–36.0)
MCV: 86.2 fl (ref 78.0–100.0)
Platelets: 339 10*3/uL (ref 150.0–400.0)
RBC: 4.68 Mil/uL (ref 3.87–5.11)
RDW: 13 % (ref 11.5–15.5)
WBC: 6.3 10*3/uL (ref 4.0–10.5)

## 2024-02-01 LAB — COMPREHENSIVE METABOLIC PANEL
ALT: 21 U/L (ref 0–35)
AST: 15 U/L (ref 0–37)
Albumin: 4.6 g/dL (ref 3.5–5.2)
Alkaline Phosphatase: 61 U/L (ref 39–117)
BUN: 13 mg/dL (ref 6–23)
CO2: 28 meq/L (ref 19–32)
Calcium: 9.3 mg/dL (ref 8.4–10.5)
Chloride: 103 meq/L (ref 96–112)
Creatinine, Ser: 0.57 mg/dL (ref 0.40–1.20)
GFR: 125.93 mL/min (ref >60.00)
Glucose, Bld: 90 mg/dL (ref 70–99)
Potassium: 4.2 meq/L (ref 3.5–5.1)
Sodium: 139 meq/L (ref 135–145)
Total Bilirubin: 0.4 mg/dL (ref 0.2–1.2)
Total Protein: 7.1 g/dL (ref 6.0–8.3)

## 2024-02-01 LAB — TSH: TSH: 1.16 u[IU]/mL (ref 0.35–5.50)

## 2024-02-01 NOTE — Progress Notes (Signed)
 Subjective:    Patient ID: Judy Miller, female    DOB: 05-04-1996, 28 y.o.   MRN: [id number]  HPI  Judy Miller is a very pleasant 28 y.o. female who presents today for complete physical and follow up of chronic conditions.  Immunizations: -Tetanus: Completed in 2022 -Influenza: Declines influenza vaccine. -HPV: Completed series   Diet: Fair diet.  Exercise: No regular exercise.  Eye exam: Completed years ago. Dental exam: Completes semi-annually    Pap Smear: Completed in January 2022, follows with Ob/GYN  BP Readings from Last 3 Encounters:  02/01/24 122/84  01/30/24 136/72  10/17/23 111/77      Review of Systems  Constitutional:  Negative for unexpected weight change.  HENT:  Negative for rhinorrhea.   Respiratory:  Negative for cough and shortness of breath.   Cardiovascular:  Negative for chest pain.  Gastrointestinal:  Negative for constipation and diarrhea.  Genitourinary:  Negative for difficulty urinating.  Musculoskeletal:  Negative for arthralgias and myalgias.  Skin:  Negative for rash.  Allergic/Immunologic: Negative for environmental allergies.  Neurological:  Negative for dizziness and headaches.  Psychiatric/Behavioral:  The patient is nervous/anxious.          Past Medical History:  Diagnosis Date   Acne vulgaris 05/12/2015   ADHD (attention deficit hyperactivity disorder)    ADHD (attention deficit hyperactivity disorder)    Anxiety    Anxiety and depression 05/12/2015   Fluoxetine 10mg  did not help with symptom control. Venlafaxine 37.5mg  two po qday did not help much with symptoms control.   Arthritis    history of this in her knees   Depression    Depression, major, single episode, mild (HCC) 04/23/2013   Generalized anxiety disorder 11/06/2013   Generalized anxiety disorder 11/06/2013   Genital herpes 11/2017   pos HSV 2 culture   Gestational hypertension, third trimester 06/13/2021   History of nephrolithiasis  03/07/2021   Postpartum anxiety 07/27/2021   Late July - started Zoloft 50mg    Preventative health care 11/10/2015   Renal stone 01/21/2019   Rhinitis, allergic 11/10/2015   S/P primary low transverse C-section 06/16/2021   Tachycardia 06/15/2021   Vaccine for human papilloma virus (HPV) types 6, 11, 16, and 18 administered     Social History   Socioeconomic History   Marital status: Single    Spouse name: Not on file   Number of children: Not on file   Years of education: Not on file   Highest education level: Not on file  Occupational History   Not on file  Tobacco Use   Smoking status: Every Day    Types: E-cigarettes   Smokeless tobacco: Never  Vaping Use   Vaping status: Every Day   Substances: Nicotine  Substance and Sexual Activity   Alcohol use: Not Currently    Comment: barely   Drug use: No    Comment: history of    Sexual activity: Not Currently    Birth control/protection: None  Other Topics Concern   Not on file  Social History Narrative   Single.   Graduated from HS in 2017.    Student in cosmetology school.   Enjoys spending time outdoors, playing with her dog.   Social Drivers of Corporate investment banker Strain: Not on file  Food Insecurity: No Food Insecurity (09/04/2023)   Hunger Vital Sign    Worried About Running Out of Food in the Last Year: Never true    Ran Out of Food  in the Last Year: Never true  Transportation Needs: No Transportation Needs (09/04/2023)   PRAPARE - Administrator, Civil Service (Medical): No    Lack of Transportation (Non-Medical): No  Physical Activity: Not on file  Stress: Not on file  Social Connections: Not on file  Intimate Partner Violence: Not At Risk (09/04/2023)   Humiliation, Afraid, Rape, and Kick questionnaire    Fear of Current or Ex-Partner: No    Emotionally Abused: No    Physically Abused: No    Sexually Abused: No    Past Surgical History:  Procedure Laterality Date   CESAREAN  SECTION N/A 06/16/2021   Procedure: CESAREAN SECTION;  Surgeon: Federico Flake, MD;  Location: [facility] LD ORS;  Service: Obstetrics;  Laterality: N/A;    Family History  Problem Relation Age of Onset   Arthritis Mother    Varicose Veins Maternal Grandmother    Heart disease Paternal Grandmother    Varicose Veins Paternal Grandmother     Allergies  Allergen Reactions   Fish Allergy Hives    Grouper specifically   Other Hives    Red hair dye    Shellfish Allergy Hives    Crab legs, specifically     Current Outpatient Medications on File Prior to Visit  Medication Sig Dispense Refill   sertraline (ZOLOFT) 50 MG tablet Take 1 tablet (50 mg total) by mouth daily. For anxiety 90 tablet 0   buPROPion (WELLBUTRIN SR) 150 MG 12 hr tablet Take 1 tablet (150 mg total) by mouth 2 (two) times daily. Take 1/2 tab daily for 5 days, then increase to 1/2 tab twice daily x 5 days then increase to 1 tab twice daily (Patient not taking: Reported on 02/01/2024) 90 tablet 2   valACYclovir (VALTREX) 1000 MG tablet Take 1 tablet (1,000 mg total) by mouth daily. (Patient not taking: Reported on 01/30/2024) 30 tablet 2   No current facility-administered medications on file prior to visit.    BP 122/84   Pulse 68   Temp 98.2 F (36.8 C) (Temporal)   Ht 5\' 6"  (1.676 m)   Wt 159 lb (72.1 kg)   LMP 01/16/2024 (Exact Date)   SpO2 99%   BMI 25.66 kg/m  Objective:   Physical Exam HENT:     Right Ear: Tympanic membrane and ear canal normal.     Left Ear: Tympanic membrane and ear canal normal.  Eyes:     Pupils: Pupils are equal, round, and reactive to light.  Cardiovascular:     Rate and Rhythm: Normal rate and regular rhythm.  Pulmonary:     Effort: Pulmonary effort is normal.     Breath sounds: Normal breath sounds.  Abdominal:     General: Bowel sounds are normal.     Palpations: Abdomen is soft.     Tenderness: There is no abdominal tenderness.  Musculoskeletal:        General: Normal  range of motion.     Cervical back: Neck supple.  Skin:    General: Skin is warm and dry.  Neurological:     Mental Status: She is alert and oriented to person, place, and time.     Cranial Nerves: No cranial nerve deficit.     Deep Tendon Reflexes:     Reflex Scores:      Patellar reflexes are 2+ on the right side and 2+ on the left side. Psychiatric:        Mood and Affect: Mood normal.  Assessment & Plan:  Preventative health care Assessment & Plan: Immunizations UTD. Declines influenza vaccine.  Pap smear UTD. Follows with GYN  Discussed the importance of a healthy diet and regular exercise in order for weight loss, and to reduce the risk of further co-morbidity.  Exam stable. Labs pending.  Follow up in 1 year for repeat physical.   Orders: -     TSH -     Comprehensive metabolic panel -     Lipid panel -     CBC  Rheumatoid arthritis of multiple sites with negative rheumatoid factor (HCC) Assessment & Plan: Evaluated by rheumatology, not on treatment per patient choice.  Recommended follow up with rheumatology.  Orders: -     TSH -     Comprehensive metabolic panel -     Lipid panel -     CBC  History of gestational hypertension Assessment & Plan: Blood pressure well-controlled off of treatment. Remain off nifedipine.   History of herpes genitalis Assessment & Plan: Infrequent outbreaks.  Continue Valtrex as needed.   Tobacco use Assessment & Plan: Discussed options, recommended she start bupropion 150 mg as prescribed by OB/GYN.  We discussed instructions for initiation. Strongly advised that she quit vaping.         Doreene Nest, NP

## 2024-02-01 NOTE — Assessment & Plan Note (Signed)
 Evaluated by rheumatology, not on treatment per patient choice.  Recommended follow up with rheumatology.

## 2024-02-01 NOTE — Assessment & Plan Note (Signed)
 Blood pressure well-controlled off of treatment. Remain off nifedipine.

## 2024-02-01 NOTE — Assessment & Plan Note (Signed)
 Immunizations UTD. Declines influenza vaccine. Pap smear UTD. Follows with GYN  Discussed the importance of a healthy diet and regular exercise in order for weight loss, and to reduce the risk of further co-morbidity.  Exam stable. Labs pending.  Follow up in 1 year for repeat physical.

## 2024-02-01 NOTE — Patient Instructions (Addendum)
 Stop by the lab prior to leaving today. I will notify you of your results once received.   Start bupropion (Wellbutrin) medication for vaping.  Follow the package instructions.  Continue Zoloft for anxiety.  It was a pleasure to see you today!

## 2024-02-01 NOTE — Assessment & Plan Note (Signed)
 Discussed options, recommended she start bupropion 150 mg as prescribed by OB/GYN.  We discussed instructions for initiation. Strongly advised that she quit vaping.

## 2024-02-01 NOTE — Assessment & Plan Note (Signed)
 Infrequent outbreaks. Continue Valtrex as needed.

## 2024-02-05 ENCOUNTER — Ambulatory Visit (HOSPITAL_COMMUNITY)
Admission: RE | Admit: 2024-02-05 | Discharge: 2024-02-05 | Disposition: A | Discharge status: Home / Self Care | Payer: Medicaid Other | Source: Ambulatory Visit | Attending: Primary Care | Admitting: Primary Care

## 2024-02-05 DIAGNOSIS — R221 Localized swelling, mass and lump, neck: Secondary | ICD-10-CM | POA: Diagnosis present

## 2024-02-08 DIAGNOSIS — L910 Hypertrophic scar: Secondary | ICD-10-CM

## 2024-02-22 ENCOUNTER — Encounter: Payer: Self-pay | Admitting: Family Medicine

## 2024-02-22 ENCOUNTER — Ambulatory Visit: Payer: Medicaid Other | Admitting: Clinical

## 2024-02-26 ENCOUNTER — Ambulatory Visit: Admitting: Obstetrics and Gynecology

## 2024-02-27 ENCOUNTER — Ambulatory Visit (INDEPENDENT_AMBULATORY_CARE_PROVIDER_SITE_OTHER): Payer: Medicaid Other | Admitting: Primary Care

## 2024-02-27 ENCOUNTER — Encounter: Payer: Self-pay | Admitting: Primary Care

## 2024-02-27 VITALS — BP 118/66 | HR 95 | Temp 97.2°F | Ht 66.0 in | Wt 157.0 lb

## 2024-02-27 DIAGNOSIS — F411 Generalized anxiety disorder: Secondary | ICD-10-CM | POA: Diagnosis not present

## 2024-02-27 DIAGNOSIS — E041 Nontoxic single thyroid nodule: Secondary | ICD-10-CM

## 2024-02-27 DIAGNOSIS — R221 Localized swelling, mass and lump, neck: Secondary | ICD-10-CM

## 2024-02-27 NOTE — Assessment & Plan Note (Addendum)
 U/S soft tissue/neck completed 02/05/24 - results pending radiology review   Will follow up with results  I evaluated patient, was consulted regarding treatment, and agree with assessment and plan per Julaine Fusi, MSN, FNP student.   Mayra Reel, NP-C

## 2024-02-27 NOTE — Patient Instructions (Signed)
 Continue current medications.  OK to increase sertraline to full tablet   Keep scheduled follow up with counseling   We will be in touch with neck ultrasound   If you symptoms have not improved in a month, please schedule a follow up. Otherwise send an update via MyChart.     You can try a few things over the counter to help with your symptoms including:  Cough: Delsym or Robitussin (get the off brand, works just as well) Chest Congestion: Mucinex (plain) Nasal Congestion/Ear Pressure/Sinus Pressure: Try using Flonase (fluticasone) nasal spray. Instill 1 spray in each nostril twice daily. This can be purchased over the counter. Body aches, fevers, headache: Ibuprofen (not to exceed 2400 mg in 24 hours) or Acetaminophen-Tylenol (not to exceed 3000 mg in 24 hours) Runny Nose/Throat Drainage/Sneezing/Itchy or Watery Eyes: An antihistamine such as Zyrtec, Claritin, Xyzal, Allegra  You should be feeling better by day seven of symptoms, but please do contact me if this is not the case.

## 2024-02-27 NOTE — Assessment & Plan Note (Addendum)
 Improving on sertraline 25mg  daily. Advised can increase to full tablet - 50mg    Continue bupropion XL 150 mg daily with titration to BID.   Follow up as scheduled with psychology for counseling/therapy   If symptoms persistent or uncontrolled in 4 weeks, schedule follow up. Update in MyChart in same time frame.   Will continue to follow.   I evaluated patient, was consulted regarding treatment, and agree with assessment and plan per Julaine Fusi, MSN, FNP student.   Mayra Reel, NP-C

## 2024-02-27 NOTE — Progress Notes (Signed)
 Subjective:    Patient ID: Judy Miller, female    DOB: 1996-10-18, 28 y.o.   MRN: [id number]  Sinus Problem Pertinent negatives include no headaches.    Judy Miller is a very pleasant 28 y.o. female with a history of GAD who presents today for follow up of anxiety.  She was last evaluated on 01/30/24 for worsening of chronic anxiety. Symptoms included worrying, irritability, thinking worst case scenario, feeling sad/don. Given her symptoms we initiated Zoloft 50 mg as she did well on this historically. She was initiated on bupropion ER 150 mg BID from GYN for vaping.   Since her last visit she's feeling better. Improved sypmtoms include less worrying, less anxiety, resolve in palpitations. She has noticed reduced patience with her children. Has been more irritable.   She's been taking sertraline 25 mg daily as she's concerned she may have a thyroid nodule. She completed her soft tissue US several weeks ago and was told by the ultrasound technician that she may have a thyroid nodule. The ultrasound has yet to be read by the radiologist. The patient has been searching on Google and found a correlation between thyroid and sertraline so she's afraid to increase the dose to 50 mg.   She began taking bupropion 1 week ago, has been taking 1/2 tablet. She has an appointment scheduled with therapy for next month. She denies SI/HI   Review of Systems  Gastrointestinal:  Negative for abdominal pain.  Neurological:  Negative for headaches.  Psychiatric/Behavioral:  Negative for suicidal ideas. The patient is nervous/anxious.          Past Medical History:  Diagnosis Date   Acne vulgaris 05/12/2015   ADHD (attention deficit hyperactivity disorder)    ADHD (attention deficit hyperactivity disorder)    Anxiety    Anxiety and depression 05/12/2015   Fluoxetine 10mg  did not help with symptom control. Venlafaxine 37.5mg  two po qday did not help much with symptoms control.    Arthritis    history of this in her knees   Depression    Depression, major, single episode, mild (HCC) 04/23/2013   Generalized anxiety disorder 11/06/2013   Generalized anxiety disorder 11/06/2013   Genital herpes 11/2017   pos HSV 2 culture   Gestational hypertension, third trimester 06/13/2021   History of nephrolithiasis 03/07/2021   Postpartum anxiety 07/27/2021   Late July - started Zoloft 50mg    Preventative health care 11/10/2015   Renal stone 01/21/2019   Rhinitis, allergic 11/10/2015   S/P primary low transverse C-section 06/16/2021   Tachycardia 06/15/2021   Vaccine for human papilloma virus (HPV) types 6, 11, 16, and 18 administered     Social History   Socioeconomic History   Marital status: Single    Spouse name: Not on file   Number of children: Not on file   Years of education: Not on file   Highest education level: Not on file  Occupational History   Not on file  Tobacco Use   Smoking status: Every Day    Types: E-cigarettes   Smokeless tobacco: Never  Vaping Use   Vaping status: Every Day   Substances: Nicotine  Substance and Sexual Activity   Alcohol use: Not Currently    Comment: barely   Drug use: No    Comment: history of    Sexual activity: Not Currently    Birth control/protection: None  Other Topics Concern   Not on file  Social History Narrative   Single.  Graduated from HS in 2017.    Student in cosmetology school.   Enjoys spending time outdoors, playing with her dog.   Social Drivers of Corporate investment banker Strain: Not on file  Food Insecurity: No Food Insecurity (09/04/2023)   Hunger Vital Sign    Worried About Running Out of Food in the Last Year: Never true    Ran Out of Food in the Last Year: Never true  Transportation Needs: No Transportation Needs (09/04/2023)   PRAPARE - Administrator, Civil Service (Medical): No    Lack of Transportation (Non-Medical): No  Physical Activity: Not on file  Stress: Not  on file  Social Connections: Not on file  Intimate Partner Violence: Not At Risk (09/04/2023)   Humiliation, Afraid, Rape, and Kick questionnaire    Fear of Current or Ex-Partner: No    Emotionally Abused: No    Physically Abused: No    Sexually Abused: No    Past Surgical History:  Procedure Laterality Date   CESAREAN SECTION N/A 06/16/2021   Procedure: CESAREAN SECTION;  Surgeon: Federico Flake, MD;  Location: [facility] LD [facility];  Service: Obstetrics;  Laterality: N/A;    Family History  Problem Relation Age of Onset   Arthritis Mother    Varicose Veins Maternal Grandmother    Heart disease Paternal Grandmother    Varicose Veins Paternal Grandmother     Allergies  Allergen Reactions   Fish Allergy Hives    Grouper specifically   Other Hives    Red hair dye    Shellfish Allergy Hives    Crab legs, specifically     Current Outpatient Medications on File Prior to Visit  Medication Sig Dispense Refill   buPROPion (WELLBUTRIN SR) 150 MG 12 hr tablet Take 1 tablet (150 mg total) by mouth 2 (two) times daily. Take 1/2 tab daily for 5 days, then increase to 1/2 tab twice daily x 5 days then increase to 1 tab twice daily 90 tablet 2   sertraline (ZOLOFT) 50 MG tablet Take 1 tablet (50 mg total) by mouth daily. For anxiety 90 tablet 0   valACYclovir (VALTREX) 1000 MG tablet Take 1 tablet (1,000 mg total) by mouth daily. 30 tablet 2   No current facility-administered medications on file prior to visit.    BP 118/66   Pulse 95   Temp (!) 97.2 F (36.2 C) (Temporal)   Ht 5\' 6"  (1.676 m)   Wt 157 lb (71.2 kg)   LMP 01/16/2024 (Exact Date)   SpO2 99%   BMI 25.34 kg/m  Objective:   Physical Exam Cardiovascular:     Rate and Rhythm: Normal rate and regular rhythm.  Pulmonary:     Effort: Pulmonary effort is normal.     Breath sounds: Normal breath sounds.  Musculoskeletal:     Cervical back: Neck supple.  Skin:    General: Skin is warm and dry.  Neurological:      Mental Status: She is alert and oriented to person, place, and time.  Psychiatric:        Mood and Affect: Mood normal.           Assessment & Plan:  Generalized anxiety disorder Assessment & Plan: Improving on sertraline 25mg  daily. Advised can increase to full tablet - 50mg    Continue bupropion XL 150 mg daily with titration to BID.   Follow up as scheduled with psychology for counseling/therapy   If symptoms persistent or uncontrolled in 4  weeks, schedule follow up. Update in MyChart in same time frame.   Will continue to follow.   I evaluated patient, was consulted regarding treatment, and agree with assessment and plan per Julaine Fusi, MSN, FNP student.   Mayra Reel, NP-C    Localized swelling, mass or lump of neck Assessment & Plan: U/S soft tissue/neck completed 02/05/24 - results pending radiology review   Will follow up with results  I evaluated patient, was consulted regarding treatment, and agree with assessment and plan per Julaine Fusi, MSN, FNP student.   Mayra Reel, NP-C          Doreene Nest, NP

## 2024-02-27 NOTE — Progress Notes (Signed)
 Established Patient Office Visit  Subjective   Patient ID: Judy Miller, female    DOB: 24-Jan-1996  Age: 28 y.o. MRN: [id number]  Chief Complaint  Patient presents with   Medical Management of Chronic Issues   Sinus Problem    Sx started Monday  Sore throat, ear pain, sneezing   HPI  Judy Miller is a 28 year old female with who is 6 months post-partum with history of tobacco use, gestational hypertension, GAD who presents today to follow up on anxiety. She also notes 2 day history of sneezing with associated symptoms of sore throat and ear pain.   Anxiety She was last evaluated on 01/30/24 for anxiety with symptoms of palpitations, chest pain, shortness of breath, brain dog, dizziness, irritability. She has longstanding history of anxiety. At last appointment, she was started on sertraline 50mg  daily and referred to psychology/counseling.  She presents today for follow up. She notes improvement with anxiety, depression, and feeling overwhelmed. Reports less patience with her children since starting medication. Denies SI/HI. Her physical symptoms of chest pain, palpitations, shortness of breath have resolved. She is taking sertraline 25mg  daily (half tablet) and started wellbutrin for smoking/vaping cessation last week, prescribed by ob-gyn. She has a therapy appointment scheduled in April.  Lump of neck  At 01/30/24 appointment, she noted a lump of soft tissue at base of neck, left side. She would like to know her u/s soft neck/soft tissue results. Reports she was told she had a swollen thyroid nodule. She had TSH 01/2024 that was WNL.  URI She notes a 2 day history of sneezing, sore throat in PM, nasal congestion, maxillary sinus pressure. No fevers or chills. Known sick contacts (boyfriend, daughter). She has tried neti pot and unspecified OTC nasal spray without relief. She has not done any home testing.     Review of Systems  Constitutional:  Negative for chills and fever.  HENT:   Positive for congestion, sinus pain and sore throat.        Ear fullness  Respiratory:  Negative for shortness of breath and wheezing.   Cardiovascular:  Negative for chest pain and palpitations.  Psychiatric/Behavioral:  Negative for depression. The patient is not nervous/anxious.        Irritability, impatience       Objective:    Garment/textile technologist Visit from 02/27/2024 in [facility] [place]  PHQ-9 Total Score 5         02/27/2024   12:22 PM 02/01/2024   12:10 PM 01/30/2024   12:39 PM 01/30/2024   12:26 PM  GAD 7 : Generalized Anxiety Score  Nervous, Anxious, on Edge 1 3 3 3   Control/stop worrying 2 3 3 3   Worry too much - different things 2 3 3 3   Trouble relaxing 0 1 0 2  Restless 0 0 0 0  Easily annoyed or irritable 3 2 2 3   Afraid - awful might happen 2 3 3 3   Total GAD 7 Score 10 15 14 17   Anxiety Difficulty Very difficult Very difficult Extremely difficult Extremely difficult      BP 118/66   Pulse 95   Temp (!) 97.2 F (36.2 C) (Temporal)   Ht 5\' 6"  (1.676 m)   Wt 71.2 kg   LMP 01/16/2024 (Exact Date)   SpO2 99%   BMI 25.34 kg/m    Physical Exam Constitutional:      Appearance: Normal appearance.  HENT:     Head: Normocephalic.  Right Ear: Tympanic membrane, ear canal and external ear normal. No drainage.     Left Ear: Tympanic membrane, ear canal and external ear normal. No drainage.     Mouth/Throat:     Mouth: Mucous membranes are moist.     Pharynx: Oropharynx is clear.  Eyes:     Conjunctiva/sclera: Conjunctivae normal.  Cardiovascular:     Rate and Rhythm: Normal rate and regular rhythm.  Pulmonary:     Effort: Pulmonary effort is normal.     Breath sounds: Normal breath sounds.  Lymphadenopathy:     Head:     Right side of head: No submental, submandibular, tonsillar or preauricular adenopathy.     Left side of head: No submental, submandibular, tonsillar or preauricular adenopathy.     Cervical:      Right cervical: No superficial cervical adenopathy.    Left cervical: No superficial cervical adenopathy.  Neurological:     General: No focal deficit present.     Mental Status: She is alert and oriented to person, place, and time.  Psychiatric:        Attention and Perception: Attention normal.        Mood and Affect: Mood normal.        Behavior: Behavior normal. Behavior is cooperative.        Thought Content: Thought content normal.        Judgment: Judgment normal.      The ASCVD Risk score (Arnett DK, et al., 2019) failed to calculate for the following reasons:   The 2019 ASCVD risk score is only valid for ages 27 to 31    Assessment & Plan:   Problem List Items Addressed This Visit       Other   Generalized anxiety disorder - Primary   Improving on sertraline 25mg  daily, per HPI and GAD7, PHQ9 Advised can increase to full tablet - 50mg    Continue wellbutrin   Follow up as scheduled with psychology for counseling/therapy   If symptoms persistent or uncontrolled in 4 weeks, schedule follow up. Update in MyChart in same time frame.   Will continue to follow.       Localized swelling, mass or lump of neck   U/S soft tissue/neck completed 02/05/24 - results pending radiology review   Will follow up with results      Follow up in 4 weeks if needed, or sooner if necessary. Otherwise follow up for routine annual physical.    Lindell Spar, RN

## 2024-03-03 ENCOUNTER — Encounter (HOSPITAL_COMMUNITY): Payer: Self-pay

## 2024-03-03 ENCOUNTER — Ambulatory Visit (HOSPITAL_COMMUNITY)
Admission: RE | Admit: 2024-03-03 | Discharge: 2024-03-03 | Disposition: A | Discharge status: Home / Self Care | Source: Ambulatory Visit | Attending: Primary Care

## 2024-03-03 DIAGNOSIS — E041 Nontoxic single thyroid nodule: Secondary | ICD-10-CM | POA: Diagnosis present

## 2024-03-03 MED ORDER — LIDOCAINE HCL (PF) 2 % IJ SOLN
INTRAMUSCULAR | Status: AC
  Filled 2024-03-03: qty 10

## 2024-03-03 MED ORDER — LIDOCAINE HCL (PF) 2 % IJ SOLN
10.0000 mL | Freq: Once | INTRAMUSCULAR | Status: AC
  Administered 2024-03-03: 10 mL

## 2024-03-03 NOTE — Progress Notes (Signed)
 PT tolerated thyroid biopsy procedure well today. Labs and afirma obtained and sent for pathology by Colette from ultrasound. PT ambulatory at discharge with no acute distress noted and verbalized understanding of discharge instructions.

## 2024-03-05 LAB — CYTOLOGY - NON PAP

## 2024-03-06 NOTE — Telephone Encounter (Signed)
 Judy Miller, This referral for dermatology should have gone to [facility]. She is an existing patient just needing referral for insurance. It looks like she was referred to the practice below which is incorrect.  Can you help?  [facility]  [address] [place] [address]  P:  [phone] F:  [phone]

## 2024-03-14 ENCOUNTER — Ambulatory Visit: Admitting: Clinical

## 2024-03-14 DIAGNOSIS — F900 Attention-deficit hyperactivity disorder, predominantly inattentive type: Secondary | ICD-10-CM

## 2024-03-14 DIAGNOSIS — F32A Depression, unspecified: Secondary | ICD-10-CM | POA: Diagnosis not present

## 2024-03-14 DIAGNOSIS — F411 Generalized anxiety disorder: Secondary | ICD-10-CM
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
## 2024-03-14 NOTE — Progress Notes (Signed)
   Doree Barthel, LCSW

## 2024-04-14 ENCOUNTER — Ambulatory Visit (INDEPENDENT_AMBULATORY_CARE_PROVIDER_SITE_OTHER): Admitting: Clinical

## 2024-04-14 DIAGNOSIS — F900 Attention-deficit hyperactivity disorder, predominantly inattentive type: Secondary | ICD-10-CM | POA: Diagnosis not present

## 2024-04-14 DIAGNOSIS — F411 Generalized anxiety disorder: Secondary | ICD-10-CM | POA: Diagnosis not present

## 2024-04-14 DIAGNOSIS — F32A Depression, unspecified: Secondary | ICD-10-CM

## 2024-04-14 NOTE — Progress Notes (Signed)
 [facility] Counselor/Therapist Progress Note  Patient ID: Rebeccalynn Cai, MRN: [id number]    Date: 04/14/24  Time Spent: 1:45  pm - 2:29 pm : 44 Minutes  Treatment Type: Individual Therapy.  Reported Symptoms: easily distracted, decreased energy  Mental Status Exam: Appearance:  Neat and Well Groomed     Behavior: Appropriate  Motor: Normal  Speech/Language:  Clear and Coherent and Normal Rate  Affect: Appropriate  Mood: normal  Thought process: normal  Thought content:   WNL  Sensory/Perceptual disturbances:   WNL  Orientation: oriented to person, place, and situation  Attention: Fair  Concentration: Fair  Memory: WNL  Fund of knowledge:  Good  Insight:   Good  Judgment:  Good  Impulse Control: Good   Risk Assessment: Danger to Self:  No Patient denied current suicidal ideation  Self-injurious Behavior: No Danger to Others: No Patient denied current homicidal ideation Duty to Warn:no Physical Aggression / Violence:No  Access to Firearms a concern: No  Gang Involvement:No   Subjective:  Patient reported she was delayed logging into today's session due to forgetting about today's appointment. Patient reported last session patient was not able to share information regarding patient's relationship with father's girlfriend. Patient stated, "she (father's girlfriend) has a strong personality", "thinks she's right about everything". Patient reported she feels father's girlfriend expects others to respect her boundaries but does not respect others' boundaries. Patient reported family dynamics as it relates to father's girlfriend impacts multiple areas of patient's life and is a current stressor. Patient reported she is starting a new job and starts school on Monday. Patient stated, "good, just stressed" in response to patient's mood. Patient stated, "we're also moving".  Patient stated, "I don't really like to be on medication". Patient stated, "I thought about  asking my doctor to be on vyvanse  or something to get me through school". Patient stated, "I think I would probably not do the psychiatrist right now". Patient reported she is open to participation in therapy. Patient stated, "definitely to learn ways to control when I am being anxious", "learn how to handle the rift in the family" in response to potential goals for therapy.   Interventions: Motivational Interviewing. Clinician conducted session via caregility video from clinician's home office. Patient provided verbal consent to proceed with telehealth session and is aware of limitations of telephone or video visits. Patient participated in session from patient's home. Patient reported patient's daughter was present during today's session and provided verbal consent for daughter to be present. Reviewed events since last session and assessed for changes. Clinician reviewed diagnoses and treatment recommendations. Provided psycho education related to diagnoses and treatment. Clinician utilized motivational interviewing to explore potential goals for therapy.  Collaboration of Care: Other not required at this time  Diagnosis:  Generalized anxiety disorder  Depression, unspecified depression type  Attention deficit hyperactivity disorder (ADHD), predominantly inattentive type   Plan: Goals/target dates to be determined during patient's follow up appointment on 05/05/24.      Burlene Carpen, LCSW

## 2024-04-23 ENCOUNTER — Other Ambulatory Visit: Payer: Self-pay | Admitting: Primary Care

## 2024-04-23 DIAGNOSIS — F411 Generalized anxiety disorder: Secondary | ICD-10-CM

## 2024-04-29 ENCOUNTER — Encounter: Payer: Self-pay | Admitting: Primary Care

## 2024-04-29 ENCOUNTER — Ambulatory Visit (INDEPENDENT_AMBULATORY_CARE_PROVIDER_SITE_OTHER): Admitting: Primary Care

## 2024-04-29 VITALS — BP 134/76 | HR 81 | Temp 97.3°F | Ht 66.0 in | Wt 152.0 lb

## 2024-04-29 DIAGNOSIS — F909 Attention-deficit hyperactivity disorder, unspecified type: Secondary | ICD-10-CM

## 2024-04-29 DIAGNOSIS — F411 Generalized anxiety disorder: Secondary | ICD-10-CM | POA: Diagnosis not present

## 2024-04-29 MED ORDER — LISDEXAMFETAMINE DIMESYLATE 20 MG PO CAPS: 20.0000 mg | ORAL_CAPSULE | Freq: Every day | ORAL | 0 refills | Status: DC

## 2024-04-29 NOTE — Assessment & Plan Note (Signed)
 Deteriorated.  Resume Vyvanse  20 mg daily x 3 months. Controlled substance contract obtained today.  Follow-up as needed.

## 2024-04-29 NOTE — Progress Notes (Signed)
 Subjective:    Patient ID: Judy Miller, female    DOB: Aug 06, 1996, 28 y.o.   MRN: [id number]  HPI  Judy Miller is a very pleasant 28 y.o. female with a history of GAD, rheumatoid arthritis who presents today to discuss difficulty focusing.   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 Since starting school she has difficulty focusing, cannot retain information, difficulty with assignments. Prior diagnosis of ADHD. Previously managed on Vyvanse  20 mg daily which helped. Has not been on meds in years as she's done well. She is requesting to resume Vyvanse  for 3 months until she can complete school.  She continues to notice irritability on sertraline , has been taking 25 mg, never increased to 50 mg. Sertraline  does help with anxiety. She began Wellbutrin  SR 150, took for about 3 months, got up to 150 mg once daily. She quit taking as she found no improvement in vaping cravings.   She is no longer breast feeding.  BP Readings from Last 3 Encounters:  04/29/24 134/76  03/03/24 119/80  02/27/24 118/66        Review of Systems  Respiratory:  Negative for shortness of breath.   Cardiovascular:  Negative for chest pain.  Psychiatric/Behavioral:  Positive for decreased concentration. Negative for sleep disturbance. The patient is nervous/anxious.          Past Medical History:  Diagnosis Date   Acne vulgaris 05/12/2015   ADHD (attention deficit hyperactivity disorder)    ADHD (attention deficit hyperactivity disorder)    Anxiety    Anxiety and depression 05/12/2015   Fluoxetine  10mg  did not help with symptom control. Venlafaxine 37.5mg  two po qday did not help much with symptoms control.   Arthritis    history of this in her knees   Depression    Depression, major, single episode, mild (HCC) 04/23/2013   Generalized anxiety disorder 11/06/2013    Generalized anxiety disorder 11/06/2013   Genital herpes 11/2017   pos HSV 2 culture   Gestational hypertension, third trimester 06/13/2021   History of nephrolithiasis 03/07/2021   Postpartum anxiety 07/27/2021   Late July - started Zoloft  50mg    Preventative health care 11/10/2015   Renal stone 01/21/2019   Rhinitis, allergic 11/10/2015   S/P primary low transverse C-section 06/16/2021   Tachycardia 06/15/2021   Vaccine for human papilloma virus (HPV) types 6, 11, 16, and 18 administered     Social History   Socioeconomic History   Marital status: Single    Spouse name: Not on file   Number of children: Not on file   Years of education: Not on file   Highest education level: Not on file  Occupational History   Not on file  Tobacco Use   Smoking status: Every Day    Types: E-cigarettes   Smokeless tobacco: Never  Vaping Use   Vaping status: Every Day   Substances: Nicotine  Substance and Sexual Activity   Alcohol use: Not Currently    Comment: barely   Drug use: No    Comment: history of    Sexual activity: Not Currently    Birth control/protection: None  Other Topics Concern   Not on file  Social History Narrative   Single.   Graduated from HS in 2017.    Student in cosmetology school.   Enjoys spending time outdoors, playing with her dog.  Social Drivers of Corporate investment banker Strain: Not on file  Food Insecurity: No Food Insecurity (09/04/2023)   Hunger Vital Sign    Worried About Running Out of Food in the Last Year: Never true    Ran Out of Food in the Last Year: Never true  Transportation Needs: No Transportation Needs (09/04/2023)   PRAPARE - Administrator, Civil Service (Medical): No    Lack of Transportation (Non-Medical): No  Physical Activity: Not on file  Stress: Not on file  Social Connections: Not on file  Intimate Partner Violence: Not At Risk (09/04/2023)   Humiliation, Afraid, Rape, and Kick questionnaire    Fear of  Current or Ex-Partner: No    Emotionally Abused: No    Physically Abused: No    Sexually Abused: No    Past Surgical History:  Procedure Laterality Date   CESAREAN SECTION N/A 06/16/2021   Procedure: CESAREAN SECTION;  Surgeon: Abner Ables, MD;  Location: [facility] LD ORS;  Service: Obstetrics;  Laterality: N/A;    Family History  Problem Relation Age of Onset   Arthritis Mother    Varicose Veins Maternal Grandmother    Heart disease Paternal Grandmother    Varicose Veins Paternal Grandmother     Allergies  Allergen Reactions   Fish Allergy Hives    Grouper specifically   Other Hives    Red hair dye    Shellfish Allergy Hives    Crab legs, specifically     Current Outpatient Medications on File Prior to Visit  Medication Sig Dispense Refill   valACYclovir  (VALTREX ) 1000 MG tablet Take 1 tablet (1,000 mg total) by mouth daily. 30 tablet 2   No current facility-administered medications on file prior to visit.    BP 134/76   Pulse 81   Temp (!) 97.3 F (36.3 C) (Temporal)   Ht 5\' 6"  (1.676 m)   Wt 152 lb (68.9 kg)   LMP 04/15/2024   SpO2 98%   BMI 24.53 kg/m  Objective:   Physical Exam Cardiovascular:     Rate and Rhythm: Normal rate and regular rhythm.  Pulmonary:     Effort: Pulmonary effort is normal.     Breath sounds: Normal breath sounds.  Musculoskeletal:     Cervical back: Neck supple.  Skin:    General: Skin is warm and dry.  Neurological:     Mental Status: She is alert and oriented to person, place, and time.  Psychiatric:        Mood and Affect: Mood normal.           Assessment & Plan:  Attention deficit hyperactivity disorder (ADHD), unspecified ADHD type Assessment & Plan: Deteriorated.  Resume Vyvanse  20 mg daily x 3 months. Controlled substance contract obtained today.  Follow-up as needed.  Orders: -     Lisdexamfetamine Dimesylate ; Take 1 capsule (20 mg total) by mouth daily.  Dispense: 30 capsule; Refill:  0  Generalized anxiety disorder Assessment & Plan: Improved but sertraline  seems to be causing side effects.  She been taking the 25 mg dose so we will have her decrease her dose to 12.5 mg daily x 2 weeks then stop. Remain off treatment for now per patient request.  Consider Lexapro  in the future if needed.         Siriah Treat K Donesha Wallander, NP

## 2024-04-29 NOTE — Assessment & Plan Note (Signed)
 Improved but sertraline  seems to be causing side effects.  She been taking the 25 mg dose so we will have her decrease her dose to 12.5 mg daily x 2 weeks then stop. Remain off treatment for now per patient request.  Consider Lexapro  in the future if needed.

## 2024-04-29 NOTE — Patient Instructions (Signed)
 Start Vyvanse  20 mg once daily in the morning for ADHD.  Reduce your sertraline  to 12.5 mg daily x 2 weeks then stop.  It was a pleasure to see you today!

## 2024-05-05 ENCOUNTER — Ambulatory Visit (INDEPENDENT_AMBULATORY_CARE_PROVIDER_SITE_OTHER): Admitting: Clinical

## 2024-05-05 DIAGNOSIS — F900 Attention-deficit hyperactivity disorder, predominantly inattentive type: Secondary | ICD-10-CM | POA: Diagnosis not present

## 2024-05-05 DIAGNOSIS — F411 Generalized anxiety disorder: Secondary | ICD-10-CM | POA: Diagnosis not present

## 2024-05-05 DIAGNOSIS — F32A Depression, unspecified: Secondary | ICD-10-CM

## 2024-05-05 NOTE — Progress Notes (Signed)
 [facility] Counselor/Therapist Progress Note  Patient ID: Judy Miller, MRN: [id number]    Date: 05/05/24  Time Spent: 10:35  am - 11:13 am : 38 Minutes  Treatment Type: Individual Therapy.  Reported Symptoms: Patient reported increase in energy since change in medication occurred, decreased sleep  Mental Status Exam: Appearance:  Neat and Well Groomed     Behavior: Appropriate  Motor: Normal  Speech/Language:  Clear and Coherent and Normal Rate  Affect: Appropriate  Mood: normal  Thought process: normal  Thought content:   WNL  Sensory/Perceptual disturbances:   WNL  Orientation: oriented to person, place, time/date, and situation  Attention: Fair  Concentration: Fair  Memory: WNL  Fund of knowledge:  Good  Insight:   Good  Judgment:  Good  Impulse Control: Good   Risk Assessment: Danger to Self:  No Patient denied current suicidal ideation  Self-injurious Behavior: No Danger to Others: No Patient denied current homicidal ideation Duty to Warn:no Physical Aggression / Violence:No  Access to Firearms a concern: No  Gang Involvement:No   Subjective:  Patient stated, I'm in the new house, already in school and I've been at my job at least a month now, things have been really good in response to events since last session.  Patient stated, good in response to patient's mood since last session. Patient reported during recent appointment with patient's PCP, PCP discontinued sertraline  and prescribed vyvanse . Patient stated, I completely forgot in response to potential goals for therapy. Patient reported she was feeling anxious and irritable while living with family members. Patient reported patient/family recently moved and patient stated, the anxiety went from a 10 to a 4, most of my problems that I had have gotten better in response to recent move. Patient reported experiencing intrusive thoughts, such as, what if I got into a car wreck right  now. Patient stated, the irritability is 60% better since I got off the sertraline , maybe even 70%.   Interventions: Motivational Interviewing. Clinician conducted session via caregility video from clinician's home office. Patient provided verbal consent to proceed with telehealth session and is aware of limitations of telephone or video visits. Patient participated in session from patient's home. Patient reported patient's friend was present during today's session and patient provided verbal consent for friend to be present during session. Reviewed events since last session and assessed for changes. Clinician utilized motivational interviewing to explore potential goals for therapy. Clinician utilized a task centered approach in collaboration with patient to begin to develop goals for therapy. Patient participated in development of the following goals and agreed to the following goals for therapy. Goals to be finalized during follow up appointment on 05/19/24.   Collaboration of Care: Other not required at this time  Diagnosis:  Generalized anxiety disorder  Depression, unspecified depression type  Attention deficit hyperactivity disorder (ADHD), predominantly inattentive type   Plan: Patient is to utilize Cognitive Behavioral Therapy, thought re-framing, mindfulness and coping strategies to decrease symptoms associated with their diagnosis. Frequency: bi-weekly  Modality: individual     Long-term goal:   Reduce overall level, frequency, and intensity of the feelings of anxiety and ADHD as evidenced by decreased irritability, intrusive thoughts, change in concentration, easily distracted, fatigue, lack of energy, lack of motivation from 7 days/week to 0 to 1 days/week per patient report for at least 3 consecutive months. Target Date: 05/05/25  Progress: established 05/05/24   Short-term goal:  Identify, challenge, and replace negative thought patterns/intrusive thoughts and negative self talk  that contribute to feelings of anxiety and worry with positive thoughts, beliefs, and positive self talk per patient's report Target Date: 05/05/25  Progress: established 05/05/24   Implement healthy sleep hygiene to increase patient's duration and quality of sleep Target Date: 05/05/25  Progress: established 05/05/24                      Judy Seats, LCSW

## 2024-05-19 ENCOUNTER — Encounter: Payer: Self-pay | Admitting: Clinical

## 2024-05-19 ENCOUNTER — Ambulatory Visit (INDEPENDENT_AMBULATORY_CARE_PROVIDER_SITE_OTHER): Admitting: Clinical

## 2024-05-19 DIAGNOSIS — F32A Depression, unspecified: Secondary | ICD-10-CM

## 2024-05-19 DIAGNOSIS — F411 Generalized anxiety disorder: Secondary | ICD-10-CM | POA: Diagnosis not present

## 2024-05-19 DIAGNOSIS — F900 Attention-deficit hyperactivity disorder, predominantly inattentive type: Secondary | ICD-10-CM

## 2024-05-19 NOTE — Progress Notes (Signed)
   Doree Barthel, LCSW

## 2024-05-19 NOTE — Progress Notes (Signed)
 [facility] Counselor/Therapist Progress Note  Patient ID: Judy Miller, MRN: [id number],    Date: 05/19/2024  Time Spent: 1:34pm - 2:19pm : 45 minutes   Treatment Type: Individual Therapy  Reported Symptoms: intrusive and ruminating thoughts at times  Mental Status Exam: Appearance:  Neat and Well Groomed     Behavior: Appropriate  Motor: Normal  Speech/Language:  Clear and Coherent and Normal Rate  Affect: Appropriate  Mood: normal  Thought process: normal  Thought content:   WNL  Sensory/Perceptual disturbances:   WNL  Orientation: oriented to person, place, time/date, and situation  Attention: Fair  Concentration: Fair  Memory: WNL  Fund of knowledge:  Good  Insight:   Good  Judgment:  Good  Impulse Control: Good   Risk Assessment: Danger to Self:  No Patient denied current suicidal ideation  Self-injurious Behavior: No Danger to Others: No Patient denied current homicidal ideation Duty to Warn:no Physical Aggression / Violence:No  Access to Firearms a concern: No  Gang Involvement:No   Subjective: Patient stated, pretty good in response to events since last session. Patient stated, my boyfriend is quitting alcohol in response to changes since last session.  Patient stated, that's been good that needed to happen in reference to boyfriend refraining from alcohol use.  Patient reported feeling much better in response to patient's mood since last session. Patient stated, that is something that would get to me almost every single night in reference to boyfriend's alcohol use. Patient reported experiencing intrusive thoughts and ruminating thoughts in response to stressors and situations. Patient reported difficulty refraining from personalization in response to situations and stressors. Patient reported improvement in patient's sleep schedule and reported patient has been going to bed earlier each night between 11pm - 11:30pm.    Interventions: Cognitive Behavioral Therapy and Motivational Interviewing. Clinician conducted session via caregility video from clinician's home office. Patient provided verbal consent to proceed with telehealth session and is aware of limitations of telephone or video visits. Patient participated in session from patient's home. Patient reported patient's children were present during today's session and provided verbal consent for children to be present. Reviewed events since last session and assessed for changes. Discussed patient's thoughts/feelings related to boyfriend's decision to refrain from alcohol use. Clinician utilized motivational interviewing to explore additional goals for therapy. Clinician utilized a task centered approach in collaboration with patient to finalize goals for therapy. Patient participated in development of goals and agreed to goals for therapy.    Collaboration of Care: Other not required at this time   Diagnosis:  Generalized anxiety disorder   Depression, unspecified depression type   Attention deficit hyperactivity disorder (ADHD), predominantly inattentive type   Plan: Patient is to utilize Cognitive Behavioral Therapy, thought re-framing, mindfulness and coping strategies to decrease symptoms associated with their diagnosis. Frequency: bi-weekly  Modality: individual      Long-term goal:   Reduce overall level, frequency, and intensity of the feelings of anxiety and ADHD as evidenced by decreased irritability, intrusive thoughts, change in concentration, easily distracted, fatigue, lack of energy, lack of motivation from 7 days/week to 0 to 1 days/week per patient report for at least 3 consecutive months. Target Date: 05/05/25  Progress: established 05/05/24    Short-term goal:  Identify, challenge, and replace cognitive distortions, negative thought patterns/intrusive thoughts/ruminating thoughts and negative self talk that contribute to feelings of anxiety,  worry, sadness, and anger with positive thoughts, beliefs, and positive self talk per patient's report Target Date: 05/05/25  Progress:  established 05/05/24    Implement healthy sleep hygiene to increase patient's duration and quality of sleep Target Date: 05/05/25  Progress: established 05/05/24    Decrease feelings of sadness and anger in response to stressors by developing and implementing healthy coping strategies Target Date: 05/05/25  Progress: established 05/19/24                            Burlene Carpen, LCSW

## 2024-06-02 ENCOUNTER — Ambulatory Visit (INDEPENDENT_AMBULATORY_CARE_PROVIDER_SITE_OTHER): Admitting: Clinical

## 2024-06-02 DIAGNOSIS — F900 Attention-deficit hyperactivity disorder, predominantly inattentive type: Secondary | ICD-10-CM

## 2024-06-02 DIAGNOSIS — F32A Depression, unspecified: Secondary | ICD-10-CM

## 2024-06-02 DIAGNOSIS — F411 Generalized anxiety disorder: Secondary | ICD-10-CM | POA: Diagnosis not present

## 2024-06-02 NOTE — Progress Notes (Signed)
   Doree Barthel, LCSW

## 2024-06-02 NOTE — Progress Notes (Signed)
 [facility] Counselor/Therapist Progress Note  Patient ID: Judy Miller, MRN: [id number],    Date: 06/02/2024  Time Spent: 10:37am - 11:28am : 51 minutes   Treatment Type: Individual Therapy  Reported Symptoms: decreased sleep  Mental Status Exam: Appearance:  Neat and Well Groomed     Behavior: Appropriate  Motor: Normal  Speech/Language:  Clear and Coherent and Normal Rate  Affect: Appropriate  Mood: normal  Thought process: normal  Thought content:   WNL  Sensory/Perceptual disturbances:   WNL  Orientation: oriented to person, place, time/date, and situation  Attention: Good  Concentration: Good  Memory: WNL  Fund of knowledge:  Good  Insight:   Good  Judgment:  Good  Impulse Control: Good   Risk Assessment: Danger to Self:  No Patient denied current suicidal ideation  Self-injurious Behavior: No Danger to Others: No Patient denied current homicidal ideation Duty to Warn:no Physical Aggression / Violence:No  Access to Firearms a concern: No  Gang Involvement:No   Subjective: Patient stated, pretty good in response to events since last session. Patient reported patient's boyfriend was abstaining from alcohol use and patient discovered boyfriend had 5 beers since trying to abstain. Patient reported patient's boyfriend recently attended AA and reported improvement in boyfriend's mood.  Patient stated, good in response to patient's mood since last session. 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 Patient reported patient's boyfriend's sister assisted patient in studying and patient stated, I feel a lot better in response to studying with boyfriend's sister.  Patient reported patient started to develop insomnia during patient's pregnancy with patient's daughter and feels current medication for treatment of ADHD may be contributing to decreased sleep. Patient reported patient's daughter wakes patient  up around 7am and patient tries to go back to sleep for a duration of time. Patient reported patient does not going to bed until 1am. Patient reported refraining from eating breakfast. Patient reported taking a nap during the day and reported sleeping up to 4 hours when napping. Patient reported using patient's phone prior to going to bed. Patient reported patient's children wake patient up throughout the night. Patient reported no current caffeine intake due to current medication. Patient reported patient has been practicing pilates at night. Patient reported checking patient's calendar at night and reported anxiety prior to bedtime related to tasks completed or tasks to be completed.   Interventions: Cognitive Behavioral Therapy. Clinician conducted session via caregility video from clinician's home office. Patient provided verbal consent to proceed with telehealth session and is aware of limitations of telephone or video visits. Patient participated in session from patient's father's home. Reviewed events since last session and assessed for changes. Explored patient's sleep hygiene/sleep schedule. Provided psycho education related to healthy sleep hygiene. Discussed strategies to decrease anxiety related to tasks completed/to be completed, such as, developing a list of tasks to be completed each day prior to work/school and use of a worry journal.    Collaboration of Care: Other not required at this time   Diagnosis:  Generalized anxiety disorder   Depression, unspecified depression type   Attention deficit hyperactivity disorder (ADHD), predominantly inattentive type   Plan: Patient is to utilize Cognitive Behavioral Therapy, thought re-framing, mindfulness and coping strategies to decrease symptoms associated with their diagnosis. Frequency: bi-weekly  Modality: individual      Long-term goal:   Reduce overall level, frequency, and intensity of the feelings of anxiety and ADHD as evidenced by  decreased irritability, intrusive  thoughts, change in concentration, easily distracted, fatigue, lack of energy, lack of motivation from 7 days/week to 0 to 1 days/week per patient report for at least 3 consecutive months. Target Date: 05/05/25  Progress: progressing    Short-term goal:  Identify, challenge, and replace cognitive distortions, negative thought patterns/intrusive thoughts/ruminating thoughts and negative self talk that contribute to feelings of anxiety, worry, sadness, and anger with positive thoughts, beliefs, and positive self talk per patient's report Target Date: 05/05/25  Progress: progressing    Implement healthy sleep hygiene to increase patient's duration and quality of sleep Target Date: 05/05/25  Progress: progressing    Decrease feelings of sadness and anger in response to stressors by developing and implementing healthy coping strategies Target Date: 05/05/25  Progress: progressing                       [name], LCSW

## 2024-06-03 DIAGNOSIS — F909 Attention-deficit hyperactivity disorder, unspecified type: Secondary | ICD-10-CM

## 2024-06-04 MED ORDER — LISDEXAMFETAMINE DIMESYLATE 20 MG PO CAPS: 20.0000 mg | ORAL_CAPSULE | Freq: Every day | ORAL | 0 refills | Status: DC

## 2024-06-23 ENCOUNTER — Ambulatory Visit (INDEPENDENT_AMBULATORY_CARE_PROVIDER_SITE_OTHER): Admitting: Clinical

## 2024-06-23 DIAGNOSIS — F900 Attention-deficit hyperactivity disorder, predominantly inattentive type: Secondary | ICD-10-CM | POA: Diagnosis not present

## 2024-06-23 DIAGNOSIS — F411 Generalized anxiety disorder: Secondary | ICD-10-CM | POA: Diagnosis not present

## 2024-06-23 DIAGNOSIS — F32A Depression, unspecified: Secondary | ICD-10-CM

## 2024-06-23 NOTE — Progress Notes (Signed)
 [facility] Counselor/Therapist Progress Note  Patient ID: Judy Miller, MRN: [id number],    Date: 06/23/2024  Time Spent: 11:34am - 12:20pm : 46 minutes  Treatment Type: Individual Therapy  Reported Symptoms: easily agitated, difficulty falling asleep last night  Mental Status Exam: Appearance:  Neat and Well Groomed     Behavior: Appropriate  Motor: Normal  Speech/Language:  Clear and Coherent and Normal Rate  Affect: Appropriate  Mood: normal  Thought process: normal  Thought content:   WNL  Sensory/Perceptual disturbances:   WNL  Orientation: oriented to person, place, time/date, and situation  Attention: Good  Concentration: Good  Memory: WNL  Fund of knowledge:  Good  Insight:   Good  Judgment:  Good  Impulse Control: Good   Risk Assessment: Danger to Self:  No Patient denied current suicidal ideation  Self-injurious Behavior: No Danger to Others: No Patient denied current homicidal ideation Duty to Warn:no Physical Aggression / Violence:No  Access to Firearms a concern: No  Gang Involvement:No   Subjective: Patient stated, just a lot of stress, I have all the major school stuff right now in response to events since last session.  Patient reported stressors related to coordinating child care and school/work schedule. Patient stated, I've definitely been easily agitated in response to patient's mood since last session. Patient reported obtaining approximately 5 hours of sleep each night due to patient's schedule and reported difficulty falling asleep last night. Patient stated, I was very very anxious before the test I had to take and anxious afterwards in reference to recent triggers for anxiety. Patient reported feelings of panic regarding patient's performance on recent test. Patient stated, I panicked myself until I got my grade. Patient stated, I did not do the worry journal, it made me more stressed to have something else to do  before bed. Patient reported patient utilized a Scientific laboratory technician. Patient stated,  no I don't think I really know any techniques in reference to relaxation techniques. Patient reported patient previously went for a drive as a coping strategy to relax.  Interventions: Cognitive Behavioral Therapy. Clinician conducted session via caregility video from clinician's home office. Patient provided verbal consent to proceed with telehealth session and is aware of limitations of telephone or video visits. Patient participated in session from patient's home. [name] were present towards the end of today's session and patient provider verbal consent for children to be present. Reviewed events since last session and assessed for changes. Discussed recent barriers to sleep. Explored and identified recent triggers for anxiety and thoughts associated with triggers. Reviewed strategies discussed during previous session to decrease anxiety associated with tasks. Provided psycho education related to negative thoughts/cognitive distortions and challenging thoughts/cognitive distortions. Explored relaxation techniques/coping strategies patient has utilized in the past. Clinician requested for homework patient practice identifying evidence for/against negative thoughts/cognitive distortions.    Collaboration of Care: Other not required at this time   Diagnosis:  Generalized anxiety disorder   Depression, unspecified depression type   Attention deficit hyperactivity disorder (ADHD), predominantly inattentive type   Plan: Patient is to utilize Cognitive Behavioral Therapy, thought re-framing, mindfulness and coping strategies to decrease symptoms associated with their diagnosis. Frequency: bi-weekly  Modality: individual      Long-term goal:   Reduce overall level, frequency, and intensity of the feelings of anxiety and ADHD as evidenced by decreased irritability, intrusive thoughts, change in concentration,  easily distracted, fatigue, lack of energy, lack of motivation from 7 days/week to 0 to 1 days/week  per patient report for at least 3 consecutive months. Target Date: 05/05/25  Progress: progressing    Short-term goal:  Identify, challenge, and replace cognitive distortions, negative thought patterns/intrusive thoughts/ruminating thoughts and negative self talk that contribute to feelings of anxiety, worry, sadness, and anger with positive thoughts, beliefs, and positive self talk per patient's report Target Date: 05/05/25  Progress: progressing    Implement healthy sleep hygiene to increase patient's duration and quality of sleep Target Date: 05/05/25  Progress: progressing    Decrease feelings of sadness and anger in response to stressors by developing and implementing healthy coping strategies Target Date: 05/05/25  Progress: progressing                       [name], LCSW

## 2024-06-23 NOTE — Progress Notes (Signed)
   Judy Barthel, LCSW

## 2024-07-07 ENCOUNTER — Ambulatory Visit: Admitting: Clinical

## 2024-07-21 ENCOUNTER — Ambulatory Visit (INDEPENDENT_AMBULATORY_CARE_PROVIDER_SITE_OTHER): Admitting: Clinical

## 2024-07-21 DIAGNOSIS — F32A Depression, unspecified: Secondary | ICD-10-CM

## 2024-07-21 DIAGNOSIS — F411 Generalized anxiety disorder: Secondary | ICD-10-CM | POA: Diagnosis not present

## 2024-07-21 DIAGNOSIS — F900 Attention-deficit hyperactivity disorder, predominantly inattentive type: Secondary | ICD-10-CM

## 2024-07-21 NOTE — Progress Notes (Signed)
 [facility] Counselor/Therapist Progress Note  Patient ID: Judy Miller, MRN: [id number],    Date: 07/21/2024  Time Spent: 10:35am - 11:23am : 48 minutes   Treatment Type: Individual Therapy  Reported Symptoms: anxiety  Mental Status Exam: Appearance:  Neat and Well Groomed     Behavior: Appropriate  Motor: Normal  Speech/Language:  Clear and Coherent and Normal Rate  Affect: Appropriate  Mood: anxious  Thought process: normal  Thought content:   WNL  Sensory/Perceptual disturbances:   WNL  Orientation: oriented to person, place, time/date, and situation  Attention: Fair  Concentration: Fair  Memory: WNL  Fund of knowledge:  Good  Insight:   Good  Judgment:  Good  Impulse Control: Good   Risk Assessment: Danger to Self:  No Patient denied current suicidal ideation  Self-injurious Behavior: No Danger to Others: No Patient denied current homicidal ideation Duty to Warn:no Physical Aggression / Violence:No  Access to Firearms a concern: No  Gang Involvement:No   Subjective: Patient reported patient had to cancel patient's last appointment due to patient's schedule. 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 Patient reported patient is considering taking the national certification exam. Patient reported patient has started applying for employment. 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 Patient reported that's been stressful in reference to school. Patient stated, still been a little anxious, I was very anxious over everything with school and finishing and then just anxious over things at home.  Patient reported patient's boyfriend discontinued alcohol use and asked if patient would be comfortable with boyfriend drinking occasionally. Patient stated, he was doing really good in reference to alcohol use. Patient reported patient's boyfriend consumed  5 beers at a birthday party yesterday. Patient stated, I said I'm not cool with that anymore in reference to boyfriend drinking occasionally and boyfriend agreed to refrain from alcohol use. Patient reported feeling anxious at the birthday party yesterday in response to boyfriend's alcohol consumption. Patient reported patient experienced anxiety and worry associated with patient's toddler yesterday. Patient stated, I was overly anxious for some reason that my toddler was going to get out of the house, I had no evidence that would happen. Patient reported patient practiced challenging the worries and stated, I still could not stop thinking about it. Patient stated, having a camera would put my mind at ease in reference to monitoring children. Patient stated, I feel better in response to current mood.   Interventions: Cognitive Behavioral Therapy. Clinician conducted session via caregility video from clinician's home office. Patient provided verbal consent to proceed with telehealth session and is aware of limitations of telephone or video visits. Patient participated in session from patient's home. [name] were present during today's session and patient provider verbal consent for children to be present. Reviewed events since last session and assessed for changes. Discussed recent cancellation and barriers to attendance. Discussed patient's experience at the end of school. Explored and identified triggers for anxiety. Explored and identified thoughts/worry associated with feelings of anxiety. Reviewed challenging negative thoughts/worry and patient's implementation of challenging thoughts/worry. Clinician requested patient complete a thought record for homework.    Collaboration of Care: Other not required at this time   Diagnosis:  Generalized anxiety disorder   Depression, unspecified depression type   Attention deficit hyperactivity disorder (ADHD), predominantly inattentive  type   Plan: Patient is to utilize Cognitive Behavioral Therapy, thought re-framing, mindfulness and coping strategies to decrease symptoms  associated with their diagnosis. Frequency: bi-weekly  Modality: individual      Long-term goal:   Reduce overall level, frequency, and intensity of the feelings of anxiety and ADHD as evidenced by decreased irritability, intrusive thoughts, change in concentration, easily distracted, fatigue, lack of energy, lack of motivation from 7 days/week to 0 to 1 days/week per patient report for at least 3 consecutive months. Target Date: 05/05/25  Progress: progressing    Short-term goal:  Identify, challenge, and replace cognitive distortions, negative thought patterns/intrusive thoughts/ruminating thoughts and negative self talk that contribute to feelings of anxiety, worry, sadness, and anger with positive thoughts, beliefs, and positive self talk per patient's report Target Date: 05/05/25  Progress: progressing    Implement healthy sleep hygiene to increase patient's duration and quality of sleep Target Date: 05/05/25  Progress: progressing    Decrease feelings of sadness and anger in response to stressors by developing and implementing healthy coping strategies Target Date: 05/05/25  Progress: progressing                   [name], LCSW

## 2024-07-21 NOTE — Progress Notes (Signed)
   [name], LCSW

## 2024-08-18 ENCOUNTER — Ambulatory Visit (INDEPENDENT_AMBULATORY_CARE_PROVIDER_SITE_OTHER): Admitting: Clinical

## 2024-08-18 DIAGNOSIS — F411 Generalized anxiety disorder: Secondary | ICD-10-CM | POA: Diagnosis not present

## 2024-08-18 DIAGNOSIS — F900 Attention-deficit hyperactivity disorder, predominantly inattentive type: Secondary | ICD-10-CM

## 2024-08-18 DIAGNOSIS — F32A Depression, unspecified: Secondary | ICD-10-CM | POA: Diagnosis not present

## 2024-08-18 NOTE — Progress Notes (Signed)
   [name], LCSW

## 2024-08-18 NOTE — Progress Notes (Addendum)
 [facility] Counselor/Therapist Progress Note  Patient ID: Judy Miller, MRN: [id number],    Date: 08/18/2024  Time Spent: 10:32am - 11:18am : 46 minutes  Treatment Type: Individual Therapy  Reported Symptoms: none reported  Mental Status Exam: Appearance:  Neat and Well Groomed     Behavior: Appropriate  Motor: Normal  Speech/Language:  Clear and Coherent and Normal Rate  Affect: Appropriate  Mood: normal  Thought process: normal  Thought content:   WNL  Sensory/Perceptual disturbances:   WNL  Orientation: oriented to person, place, time/date, and situation  Attention: Fair  Concentration: Fair  Memory: WNL  Fund of knowledge:  Good  Insight:   Good  Judgment:  Good  Impulse Control: Good   Risk Assessment: Danger to Self:  No Patient denied current suicidal ideation  Self-injurious Behavior: No Danger to Others: No Patient denied current homicidal ideation Duty to Warn:no Physical Aggression / Violence:No  Access to Firearms a concern: No  Gang Involvement:No   Subjective: Patient reported patient had a job interview and completed the simulation portion of the interview. Patient reported patient feels patient missed a component of the simulation portion of the interview. Patient stated, I'm thinking it is what it is and stated, that was the main place I wanted to work in reference to recent job interview. Patient reported patient has applied to other job opportunities and has considered other opportunities to gain work related experience. Patient stated, I feel like I've been good in response to patient's mood since last session. Patient stated, I was in a pretty bad mood after I left in reference to patient's mood after job interview and stated, it kind of went into the next day in reference patient's mood. Patient stated, I did not do that in response to thought record and stated, I completely forgot. Patient reported feeling angry with  herself for forgetting a step in the simulation portion of recent interview and stated, the thing that I messed up on was the easiest thing. Patient stated, I'm thinking negative and he's probably thinking what was she thinking in reference to thoughts associated with recent interview. Patient stated, it would be a lot of them in reference to disqualifying the positives. Patient stated, its good in response to patient's current mood  Interventions: Cognitive Behavioral Therapy. Clinician conducted initial portion of session via caregility video and the remainder of session (38 minutes) via telephone from clinician's home office due to loss of caregility connection. Patient provided verbal consent to proceed with telehealth session and is aware of limitations of telephone or video visits. Patient participated in session from patient's home. [name] were present during today's session and patient provider verbal consent for children to be present. Reviewed events since last session and assessed for changes. Discussed recent job interview and processed patient's response to interview. Reviewed thought record and explored barriers to completing homework assignment. Assisted patient in completing homework assignment/thought record during session. Explored thoughts and feelings triggered by recent job interview.  Assisted patient in exploring and identifying positives related to job interview. Clinician reflected observations regarding cognitive distortions, such as, disqualifying positives. Provided psycho education related to cognitive distortions. Clinician requested patient complete a thought record for homework.    Collaboration of Care: Other not required at this time   Diagnosis:  Generalized anxiety disorder   Depression, unspecified depression type   Attention deficit hyperactivity disorder (ADHD), predominantly inattentive type   Plan: Patient is to utilize [employer]  Therapy, thought re-framing,  mindfulness and coping strategies to decrease symptoms associated with their diagnosis. Frequency: bi-weekly  Modality: individual      Long-term goal:   Reduce overall level, frequency, and intensity of the feelings of anxiety and ADHD as evidenced by decreased irritability, intrusive thoughts, change in concentration, easily distracted, fatigue, lack of energy, lack of motivation from 7 days/week to 0 to 1 days/week per patient report for at least 3 consecutive months. Target Date: 05/05/25  Progress: progressing    Short-term goal:  Identify, challenge, and replace cognitive distortions, negative thought patterns/intrusive thoughts/ruminating thoughts and negative self talk that contribute to feelings of anxiety, worry, sadness, and anger with positive thoughts, beliefs, and positive self talk per patient's report Target Date: 05/05/25  Progress: progressing    Implement healthy sleep hygiene to increase patient's duration and quality of sleep Target Date: 05/05/25  Progress: progressing    Decrease feelings of sadness and anger in response to stressors by developing and implementing healthy coping strategies Target Date: 05/05/25  Progress: progressing                 [name], LCSW

## 2024-08-25 ENCOUNTER — Ambulatory Visit: Payer: Self-pay | Admitting: *Deleted

## 2024-08-25 NOTE — Telephone Encounter (Signed)
 Noted, will evaluate.

## 2024-08-25 NOTE — Telephone Encounter (Signed)
 FYI Only or Action Required?: FYI only for provider.  Patient was last seen in primary care on 04/29/2024 by Judy Comer POUR, NP.  Called Nurse Triage reporting Anxiety.  Symptoms began a week ago.  Interventions attempted: Nothing.  Symptoms are: gradually worsening.  Triage Disposition: See PCP When Office is Open (Within 3 Days)  Patient/caregiver understands and will follow disposition?: yes   Reason for Disposition  MODERATE anxiety (e.g., persistent or frequent anxiety symptoms; interferes with sleep, school, or work)  Answer Assessment - Initial Assessment Questions 1. CONCERN: Did anything happen that prompted you to call today?      Anemic/cycle 2. ANXIETY SYMPTOMS: Can you describe how you (your loved one; patient) have been feeling? (e.g., tense, restless, panicky, anxious, keyed up, overwhelmed, sense of impending doom).      Chest pain, nausea, anxious 3. ONSET: How long have you been feeling this way? (e.g., hours, days, weeks)     Started 1 week ago 4. SEVERITY: How would you rate the level of anxiety? (e.g., 0 - 10; or mild, moderate, severe).     6-7/10 5. FUNCTIONAL IMPAIRMENT: How have these feelings affected your ability to do daily activities? Have you had more difficulty than usual doing your normal daily activities? (e.g., getting better, same, worse; self-care, school, work, interactions)     Yes- no interest in doing anything due to being anxious  6. HISTORY: Have you felt this way before? Have you ever been diagnosed with an anxiety problem in the past? (e.g., generalized anxiety disorder, panic attacks, PTSD). If Yes, ask: How was this problem treated? (e.g., medicines, counseling, etc.)     Yes- counseling 7. RISK OF HARM - SUICIDAL IDEATION: Do you ever have thoughts of hurting or killing yourself? If Yes, ask:  Do you have these feelings now? Do you have a plan on how you would do this?     No- no plans 8. TREATMENT:  What  has been done so far to treat this anxiety? (e.g., medicines, relaxation strategies). What has helped?     no 9. THERAPIST: Do you have a counselor or therapist? If Yes, ask: What is their name?     Yes- patient sees counseling at office 10. POTENTIAL TRIGGERS: Do you drink caffeinated beverages (e.g., coffee, colas, teas), and how much daily? Do you drink alcohol or use any drugs? Have you started any new medicines recently?       Caffeine, no new meds, no alcohol/drug 11. PATIENT SUPPORT: Who is with you now? Who do you live with? Do you have family or friends who you can talk to?        Patient has good support 12. OTHER SYMPTOMS: Do you have any other symptoms? (e.g., feeling depressed, trouble concentrating, trouble sleeping, trouble breathing, palpitations or fast heartbeat, chest pain, sweating, nausea, or diarrhea)       Chest pain- quick- comes and goes- same as before- associated with anxiety  Protocols used: Anxiety and Panic Attack-A-AH   Copied from CRM #[id number]. Topic: Clinical - Red Word Triage >> Aug 25, 2024  9:25 AM Deaijah H wrote: Red Word that prompted transfer to Nurse Triage: Anxiety / chest pain

## 2024-08-26 ENCOUNTER — Ambulatory Visit (INDEPENDENT_AMBULATORY_CARE_PROVIDER_SITE_OTHER): Admitting: Primary Care

## 2024-08-26 VITALS — BP 134/74 | HR 104 | Temp 98.2°F | Ht 66.0 in | Wt 148.0 lb

## 2024-08-26 DIAGNOSIS — F411 Generalized anxiety disorder: Secondary | ICD-10-CM

## 2024-08-26 DIAGNOSIS — Z720 Tobacco use: Secondary | ICD-10-CM

## 2024-08-26 MED ORDER — NICOTINE 21 MG/24HR TD PT24: 21.0000 mg | MEDICATED_PATCH | Freq: Every day | TRANSDERMAL | 0 refills | Status: AC

## 2024-08-26 MED ORDER — ESCITALOPRAM OXALATE 10 MG PO TABS: 10.0000 mg | ORAL_TABLET | Freq: Every day | ORAL | 0 refills | Status: DC

## 2024-08-26 MED ORDER — NICOTINE POLACRILEX 2 MG MT GUM: CHEWING_GUM | OROMUCOSAL | 0 refills | Status: DC

## 2024-08-26 NOTE — Assessment & Plan Note (Signed)
 Ready to quit.  Discussed options for treatment, she would like to start both nicotine  patches and gum.  Prescriptions for both sent to pharmacy.  Instructions for use provided. She will update in about 3 weeks.

## 2024-08-26 NOTE — Progress Notes (Signed)
 Subjective:    Patient ID: Judy Miller, female    DOB: [birth year], 28 y.o.   MRN: [id number]  Judy Miller is a very pleasant 28 y.o. female with a history of ADHD, GAD who presents today to discuss anxiety.  She would also like to discuss vaping.  She was last evaluated on 04/29/2024 for symptoms of difficulty focusing.  During this visit her Vyvanse  was resumed at 40 mg daily.  Chronic history of anxiety.  Previously managed on Zoloft  at 25 mg, 50 mg at 100 mg.  Zoloft  helped with anxiety but caused irritability.    Over the last few weeks her symptoms of anxiety have progressed. Symptoms include chest pain, left sided upper extremity numbness, left jaw pain. She's been evaluated for these symptoms in the past, no cardiac cause was found. Other symptoms include constant worrying, increased vaping due oto anxiety, irritability. They Vyvanse  caused her to feel more anxious and irritable.  She denies pain with deep inspiration, exertional dyspnea.  She does have an IUD.  Chronic history of vaping for years, is currently vaping every 30 minutes.  Sometimes she will wake from sleep and start vaping.  She is ready to quit. Also initiated on Wellbutrin  SR 150 mg for vaping, found no improvement so she discontinued.  She is interested in nicotine  patches and nicotine  gum.  Review of Systems  Respiratory:  Negative for shortness of breath.   Cardiovascular:  Positive for chest pain and palpitations.  Psychiatric/Behavioral:  The patient is nervous/anxious.          Past Medical History:  Diagnosis Date   Acne vulgaris 05/12/2015   ADHD (attention deficit hyperactivity disorder)    ADHD (attention deficit hyperactivity disorder)    Anxiety    Anxiety and depression 05/12/2015   Fluoxetine  10mg  did not help with symptom control. Venlafaxine 37.5mg  two po qday did not help much with symptoms control.   Arthritis    history of this in her knees   Depression    Depression,  major, single episode, mild 04/23/2013   Generalized anxiety disorder 11/06/2013   Generalized anxiety disorder 11/06/2013   Genital herpes 11/2017   pos HSV 2 culture   Gestational hypertension, third trimester 06/13/2021   History of nephrolithiasis 03/07/2021   Postpartum anxiety 07/27/2021   Late July - started Zoloft  50mg    Preventative health care 11/10/2015   Renal stone 01/21/2019   Rhinitis, allergic 11/10/2015   S/P primary low transverse C-section 06/16/2021   Tachycardia 06/15/2021   Vaccine for human papilloma virus (HPV) types 6, 11, 16, and 18 administered     Social History   Socioeconomic History   Marital status: Single    Spouse name: Not on file   Number of children: Not on file   Years of education: Not on file   Highest education level: 12th grade  Occupational History   Not on file  Tobacco Use   Smoking status: Every Day    Types: E-cigarettes   Smokeless tobacco: Never  Vaping Use   Vaping status: Every Day   Substances: Nicotine   Substance and Sexual Activity   Alcohol use: Not Currently    Comment: barely   Drug use: No    Comment: history of    Sexual activity: Not Currently    Birth control/protection: None  Other Topics Concern   Not on file  Social History Narrative   Single.   Graduated from HS in 2017.    Student  in cosmetology school.   Enjoys spending time outdoors, playing with her dog.   Social Drivers of Health   Financial Resource Strain: Medium Risk (08/26/2024)   Overall Financial Resource Strain (CARDIA)    Difficulty of Paying Living Expenses: Somewhat hard  Food Insecurity: No Food Insecurity (08/26/2024)   Hunger Vital Sign    Worried About Running Out of Food in the Last Year: Never true    Ran Out of Food in the Last Year: Never true  Transportation Needs: No Transportation Needs (08/26/2024)   PRAPARE - Administrator, Civil Service (Medical): No    Lack of Transportation (Non-Medical): No  Physical  Activity: Sufficiently Active (08/26/2024)   Exercise Vital Sign    Days of Exercise per Week: 7 days    Minutes of Exercise per Session: 90 min  Stress: Stress Concern Present (08/26/2024)   [employer] of Occupational Health - Occupational Stress Questionnaire    Feeling of Stress: To some extent  Social Connections: Moderately Isolated (08/26/2024)   Social Connection and Isolation Panel    Frequency of Communication with Friends and Family: More than three times a week    Frequency of Social Gatherings with Friends and Family: Three times a week    Attends Religious Services: Never    Active Member of Clubs or Organizations: No    Attends Banker Meetings: Not on file    Marital Status: Living with partner  Intimate Partner Violence: Not At Risk (09/04/2023)   Humiliation, Afraid, Rape, and Kick questionnaire    Fear of Current or Ex-Partner: No    Emotionally Abused: No    Physically Abused: No    Sexually Abused: No    Past Surgical History:  Procedure Laterality Date   CESAREAN SECTION N/A 06/16/2021   Procedure: CESAREAN SECTION;  Surgeon: Eldonna Suzen Octave, MD;  Location: [facility] LD ORS;  Service: Obstetrics;  Laterality: N/A;    Family History  Problem Relation Age of Onset   Arthritis Mother    Varicose Veins Maternal Grandmother    Heart disease Paternal Grandmother    Varicose Veins Paternal Grandmother     Allergies  Allergen Reactions   Fish Allergy Hives    Grouper specifically   Other Hives    Red hair dye    Shellfish Allergy Hives    Crab legs, specifically     Current Outpatient Medications on File Prior to Visit  Medication Sig Dispense Refill   valACYclovir  (VALTREX ) 1000 MG tablet Take 1 tablet (1,000 mg total) by mouth daily. 30 tablet 2   No current facility-administered medications on file prior to visit.    BP 134/74   Pulse (!) 104   Temp 98.2 F (36.8 C) (Temporal)   Ht 5' 6 (1.676 m)   Wt 148 lb (67.1 kg)    LMP 08/04/2024 (Approximate)   SpO2 98%   Breastfeeding No   BMI 23.89 kg/m  Objective:   Physical Exam Cardiovascular:     Rate and Rhythm: Normal rate and regular rhythm.  Pulmonary:     Effort: Pulmonary effort is normal.     Breath sounds: Normal breath sounds.  Musculoskeletal:     Cervical back: Neck supple.  Skin:    General: Skin is warm and dry.  Neurological:     Mental Status: She is alert and oriented to person, place, and time.  Psychiatric:        Mood and Affect: Mood normal.  Physical Exam        Assessment & Plan:  Generalized anxiety disorder Assessment & Plan: Uncontrolled.  Discussed options for treatment.  Start escitalopram  (Lexapro ) 10 mg for anxiety. Take 1/2 tablet by mouth once daily for 1 week, then increase to 1 full tablet thereafter.  She will update in about 4 weeks.  Orders: -     Escitalopram  Oxalate; Take 1 tablet (10 mg total) by mouth daily. For anxiety  Dispense: 90 tablet; Refill: 0  Current every day nicotine  vaping Assessment & Plan: Ready to quit.  Discussed options for treatment, she would like to start both nicotine  patches and gum.  Prescriptions for both sent to pharmacy.  Instructions for use provided. She will update in about 3 weeks.  Orders: -     Nicotine ; Place 1 patch (21 mg total) onto the skin daily.  Dispense: 28 patch; Refill: 0 -     Nicotine  Polacrilex; Chew 1 piece of gum every 1 hour as needed. Do not exceed 24 pieces in 24 hours.  Dispense: 100 tablet; Refill: 0    Assessment and Plan Assessment & Plan         Comer MARLA Gaskins, NP     History of Present Illness

## 2024-08-26 NOTE — Patient Instructions (Signed)
 Start escitalopram  (Lexapro ) 10 mg for anxiety. Take 1/2 tablet by mouth once daily for 1 week, then increase to 1 full tablet thereafter.   Apply the nicotine  patch once daily.  Be sure to rotate your sites to avoid skin irritation.  You may chew 1 piece of Nicorette  gum every 1 hour as needed.  Do not exceed 24 pieces in 24 hours.  Please update me as discussed.  It was a pleasure to see you today!

## 2024-08-26 NOTE — Assessment & Plan Note (Signed)
 Uncontrolled.  Discussed options for treatment.  Start escitalopram  (Lexapro ) 10 mg for anxiety. Take 1/2 tablet by mouth once daily for 1 week, then increase to 1 full tablet thereafter.  She will update in about 4 weeks.

## 2024-09-04 ENCOUNTER — Ambulatory Visit: Payer: Self-pay

## 2024-09-04 NOTE — Telephone Encounter (Signed)
 Appointment with Dr. Avelina 09/05/2024.

## 2024-09-04 NOTE — Telephone Encounter (Signed)
 FYI Only or Action Required?: FYI only for provider.  Patient was last seen in primary care on 08/26/2024 by Gretta Comer POUR, NP.  Called Nurse Triage reporting Medication Reaction.  Symptoms began a week ago.  Interventions attempted: OTC medications: Benadryl .  Symptoms are: gradually worsening.  Triage Disposition: See Physician Within 24 Hours  Patient/caregiver understands and will follow disposition?: Yes     Copied from CRM [id number]. Topic: Clinical - Red Word Triage >> Sep 04, 2024  2:25 PM [place] E wrote: [facility] that prompted transfer to Nurse Triage: Medication reaction to escitalopram  (LEXAPRO ) 10 MG tablet. Patient experiencing hives. Reason for Disposition  [1] MODERATE-SEVERE hives (e.g.,hives interfere with normal activities or work) AND [2] not improved after taking antihistamine (e.g., cetirizine , fexofenadine, or loratadine) > 24 hours  Answer Assessment - Initial Assessment Questions Patient states she has taken Amoxicillin  recently. Taking bendryl for symptoms.   1. APPEARANCE: What does the rash look like?      Rash all over  2. LOCATION: Where is the rash located?      Feet, upper stomach area, neck  3. NUMBER: How many hives are there?      A lot all over her body  4. SIZE: How big are the hives? (e.g., inches, cm, compare to coins) Do they all look the same or do they vary in shape and size?      Pea sized  5. ONSET: When did the hives begin? (e.g., hours or days ago)      1 week ago  6. ITCHING: Does it itch? If Yes, ask: How bad is the itch?  (e.g., none, mild, moderate, severe)     Moderate  7. RECURRENT PROBLEM: Have you had hives before? If Yes, ask: When was the last time? and What happened that time?      Yes  8. TRIGGERS: Were you exposed to any new food, plant, cosmetic product or animal just before the hives began?     Started a new medication recently Lexapro   9. OTHER SYMPTOMS: Do you have any other  symptoms? (e.g., fever, tongue swelling, difficulty breathing, abdomen pain)     Denies  Protocols used: Hives-A-AH

## 2024-09-05 ENCOUNTER — Ambulatory Visit: Payer: Self-pay | Admitting: Family Medicine

## 2024-09-12 ENCOUNTER — Ambulatory Visit: Payer: Self-pay | Admitting: Clinical

## 2024-11-17 ENCOUNTER — Other Ambulatory Visit: Payer: Self-pay | Admitting: Primary Care

## 2024-11-17 DIAGNOSIS — F411 Generalized anxiety disorder: Secondary | ICD-10-CM

## 2024-12-08 NOTE — Telephone Encounter (Signed)
 Can we get her scheduled for a virtual or in person visit to discuss her anxiety?

## 2024-12-09 ENCOUNTER — Ambulatory Visit: Payer: Self-pay

## 2024-12-09 NOTE — Telephone Encounter (Signed)
 Noted, will evaluate.

## 2024-12-09 NOTE — Telephone Encounter (Signed)
 FYI Only or Action Required?: FYI only for provider: appointment scheduled on 1/9.  Patient was last seen in primary care on 08/26/2024 by Gretta Comer POUR, NP.  Called Nurse Triage reporting Anxiety and Depression.  Symptoms began chronic issue, worse lately.  Interventions attempted: Prescription medications: Lexapro .  Symptoms are: unchanged.  Triage Disposition: See PCP When Office is Open (Within 3 Days)  Patient/caregiver understands and will follow disposition?: Yes  Copied from CRM [id number]. Topic: Clinical - Red Word Triage >> Dec 09, 2024  3:35 PM Suzen RAMAN wrote: Red Word that prompted transfer to Nurse Triage:elevated bp, anxiety and depression per Mallie Gretta okay to schedule a virtual or in person visit to discuss her anxiety Reason for Disposition  MODERATE anxiety (e.g., persistent or frequent anxiety symptoms; interferes with sleep, school, or work)  Answer Assessment - Initial Assessment Questions 1. CONCERN: Did anything happen that prompted you to call today?      Increase in anxiety and depression, wanting a different prescription  2. ANXIETY SYMPTOMS: Can you describe how you (your loved one; patient) have been feeling? (e.g., tense, restless, panicky, anxious, keyed up, overwhelmed, sense of impending doom).      Getting more frustrated with her toddler  3. ONSET: How long have you been feeling this way? (e.g., hours, days, weeks)     This is a chronic symptom for the pt  4. SEVERITY: How would you rate the level of anxiety? (e.g., 0 - 10; or mild, moderate, severe).     6/10  5. FUNCTIONAL IMPAIRMENT: How have these feelings affected your ability to do daily activities? Have you had more difficulty than usual doing your normal daily activities? (e.g., getting better, same, worse; self-care, school, work, interactions)     Yes, she reports she is more stressed out getting out of the house  6. HISTORY: Have you felt this way before? Have  you ever been diagnosed with an anxiety problem in the past? (e.g., generalized anxiety disorder, panic attacks, PTSD). If Yes, ask: How was this problem treated? (e.g., medicines, counseling, etc.)     Yes, Hx of GAD and depression  7. RISK OF HARM - SUICIDAL IDEATION: Do you ever have thoughts of hurting or killing yourself? If Yes, ask:  Do you have these feelings now? Do you have a plan on how you would do this?     Denies SI/HI  8. TREATMENT:  What has been done so far to treat this anxiety? (e.g., medicines, relaxation strategies). What has helped?     Took Lexapro  but felt it was ineffective and weaned herself off it approx 1 month  9. THERAPIST: Do you have a counselor or therapist? If Yes, ask: What is their name?     Not at this time, pt recently lost health insurance  10. POTENTIAL TRIGGERS: Do you drink caffeinated beverages (e.g., coffee, colas, teas), and how much daily? Do you drink alcohol or use any drugs? Have you started any new medicines recently?       Leaving the house and juggling her two young kids  11. PATIENT SUPPORT: Who is with you now? Who do you live with? Do you have family or friends who you can talk to?        Her partner, her father  25. OTHER SYMPTOMS: Do you have any other symptoms? (e.g., feeling depressed, trouble concentrating, trouble sleeping, trouble breathing, palpitations or fast heartbeat, chest pain, sweating, nausea, or diarrhea)       Depression  Took Lexapro  for 1.5-2 months and states it was not effective. She weaned off of it approx 1 month ago and is no longer taking.  Protocols used: Anxiety and Panic Attack-A-AH

## 2024-12-12 ENCOUNTER — Telehealth (INDEPENDENT_AMBULATORY_CARE_PROVIDER_SITE_OTHER): Payer: Self-pay | Admitting: Primary Care

## 2024-12-12 ENCOUNTER — Encounter: Payer: Self-pay | Admitting: Primary Care

## 2024-12-12 VITALS — BP 129/67 | HR 77 | Ht 66.0 in | Wt 152.5 lb

## 2024-12-12 DIAGNOSIS — F411 Generalized anxiety disorder: Secondary | ICD-10-CM

## 2024-12-12 MED ORDER — FLUOXETINE HCL 20 MG PO TABS: 20.0000 mg | ORAL_TABLET | Freq: Every day | ORAL | 0 refills | Status: AC

## 2024-12-12 NOTE — Assessment & Plan Note (Signed)
 Uncontrolled.  Remain off Lexapro . Failed Zoloft  previously.  Start fluoxetine  (Prozac ) tablets for anxiety and depression. Patient is to take 1/2 tablet daily for 8 days, then advance to 1 full tablet thereafter. We discussed possible side effects of headache, GI upset, drowsiness, and SI/HI.  Patient verbalized understanding.   Follow up in 4 weeks for re-evaluation.

## 2024-12-12 NOTE — Progress Notes (Signed)
 "   Patient ID: Judy Miller, female    DOB: [birth year], 29 y.o.   MRN: [id number]  Virtual visit completed through caregility, a video enabled telemedicine application. Due to national recommendations of social distancing due to COVID-19, a virtual visit is felt to be most appropriate for this patient at this time. Reviewed limitations, risks, security and privacy concerns of performing a virtual visit and the availability of in person appointments. I also reviewed that there may be a patient responsible charge related to this service. The patient agreed to proceed.   Patient location: home Provider location: [place], office Persons participating in this virtual visit: patient, provider   If any vitals were documented, they were collected by patient at home unless specified below.    BP 129/67   Pulse 77   Ht 5' 6 (1.676 m)   Wt 152 lb 8 oz (69.2 kg)   BMI 24.61 kg/m    CC: Anxiety Subjective:   HPI: Judy Miller is a 29 y.o. female with a history of anxiety and ADHD presenting on 12/12/2024 for Medical Management of Chronic Issues (Worsening anxiety. )  She was last evaluated on 08/26/2024 for increased symptoms of anxiety including chest pain, jaw pain, left upper extremity weakness for which has been worked up previously.  Other symptoms include worrying, irritability, increased anxiety.  She was also vaping.  During this visit we initiated Lexapro  10 mg daily.   She has previously failed Zoloft , and Wellbutrin  SR which was initiated for vaping cravings.  Vyvanse  caused her to feel more irritable.   Today she discusses that her anxiety is worse. She is feeling rage, depression, stress with her 27 year old, feeling down, feeling helpless. She is training for her new job which has been stressful. She initially broke out in hives after taking Lexapro , she stopped and restarted it a few times, resumed and then had no hives.  She is allergic to her laundry  detergent.  She felt no improvement in symptoms while taking Lexapro  so she stopped Lexapro  3 weeks ago.   She was prescribed fluoxetine  in 2014 and then again in 2018 and 2019 but she doesn't really think she gave it chance. She asks about fluoxetine  and other medications such as Effexor and Cymbalta.  She denies SI/HI      Relevant past medical, surgical, family and social history reviewed and updated as indicated. Interim medical history since our last visit reviewed. Allergies and medications reviewed and updated. Outpatient Medications Prior to Visit  Medication Sig Dispense Refill   valACYclovir  (VALTREX ) 1000 MG tablet Take 1 tablet (1,000 mg total) by mouth daily. (Patient taking differently: Take 1,000 mg by mouth daily. As needed.) 30 tablet 2   nicotine  (NICODERM CQ  - DOSED IN MG/24 HOURS) 21 mg/24hr patch Place 1 patch (21 mg total) onto the skin daily. 28 patch 0   escitalopram  (LEXAPRO ) 10 MG tablet TAKE 1 TABLET (10 MG TOTAL) BY MOUTH DAILY. FOR ANXIETY (Patient not taking: Reported on 12/12/2024) 90 tablet 0   nicotine  polacrilex ([employer] NICOTINE  POLACRILEX) 2 MG gum Chew 1 piece of gum every 1 hour as needed. Do not exceed 24 pieces in 24 hours. 100 tablet 0   No facility-administered medications prior to visit.     Per HPI unless specifically indicated in ROS section below Review of Systems  Respiratory:  Negative for shortness of breath.   Cardiovascular:  Negative for chest pain.  Psychiatric/Behavioral:  Negative for suicidal  ideas. The patient is nervous/anxious.        See HPI   Objective:  BP 129/67   Pulse 77   Ht 5' 6 (1.676 m)   Wt 152 lb 8 oz (69.2 kg)   BMI 24.61 kg/m   Wt Readings from Last 3 Encounters:  12/12/24 152 lb 8 oz (69.2 kg)  08/26/24 148 lb (67.1 kg)  04/29/24 152 lb (68.9 kg)       Physical exam: General: Alert and oriented x 3, no distress, does not appear sickly  Pulmonary: Speaks in complete sentences without increased work of  breathing, no cough during visit.  Psychiatric: Normal mood, thought content, and behavior.     Results for orders placed or performed during the hospital encounter of 03/03/24  Cytology - Non PAP;   Collection Time: 03/03/24  9:00 AM  Result Value Ref Range   CYTOLOGY - NON GYN      CYTOLOGY - NON PAP CASE: [id number] PATIENT: Judy Miller Non-Gynecological Cytology Report     Clinical History: 2.9 cm LLL Specimen Submitted:  A. THYROID , LLL, FINE NEEDLE ASPIRATION   FINAL MICROSCOPIC DIAGNOSIS: - Benign follicular nodule (Bethesda category II)  SPECIMEN ADEQUACY: Satisfactory for evaluation  GROSS: Received is/are 30 ccs of pale pink Cytolyt solution. (JLR:jlr) Prepared: Smears:  0 Concentration Method (Thin Prep):  1 Cell Block:  Cell block attempted, not obtained. Additional Studies:  Also there was an Afirma collected.     Final Diagnosis performed by Mark LeGolvan DO.   Electronically signed 03/05/2024 Technical component performed at [facility], [address] [place], [place] [address].  Professional component performed at [facility], [address] [place], [place] [address].  Immunohistochemistry Technical component (if applicable) was performed at [facility]. [address], [place], [place] [address].   IMMUNOHISTOCHEMISTRY DISCLAIMER (if applicable): Some of these immunohistochemical stains may have been developed and the performance characteristics determine by [facility]. Some may not have been cleared or approved by the U.S. Food and Drug Administration. The FDA has determined that such clearance or approval is not necessary. This test is used for clinical purposes. It should not be regarded as investigational or for research. This laboratory is certified under the Clinical Laboratory Improvement Amendments of 1988 (CLIA-88) as qualified to perform high  complexity clinical laboratory testing.  The controls stained appropriately.   IHC stains are performed on formalin fixed, paraffin embedded tissue using a 3,3diaminobenzidine (DAB) chromogen and Leica Bond Autostainer System. The staining intensity of the nucleus is score manually and is reported as the percentage of tumor cell nuclei demon strating specific nuclear staining. The specimens are fixed in 10% Neutral Formalin for at least 6 hours and up to 72hrs. These tests are validated on decalcified tissue. Results should be interpreted with caution given the possibility of false negative results on decalcified specimens. Antibody Clones are as follows ER-clone [id number], PR-clone 16, Ki67- clone MM1. Some of these immunohistochemical stains may have been developed and the performance characteristics determined by [facility].    Assessment & Plan:   Problem List Items Addressed This Visit       Other   Generalized anxiety disorder - Primary   Uncontrolled.  Remain off Lexapro . Failed Zoloft  previously.  Start fluoxetine  (Prozac ) tablets for anxiety and depression. Patient is to take 1/2 tablet daily for 8 days, then advance to 1 full tablet thereafter. We discussed possible side effects of headache,  GI upset, drowsiness, and SI/HI.  Patient verbalized understanding.   Follow up in 4 weeks for re-evaluation.        Relevant Medications   FLUoxetine  (PROZAC ) 20 MG tablet     Meds ordered this encounter  Medications   FLUoxetine  (PROZAC ) 20 MG tablet    Sig: Take 1 tablet (20 mg total) by mouth daily. for anxiety and depression.    Dispense:  90 tablet    Refill:  0    Supervising Provider:   BEDSOLE, AMY E [[id number]]   No orders of the defined types were placed in this encounter.   I discussed the assessment and treatment plan with the patient. The patient was provided an opportunity to ask questions and all were answered. The patient agreed with the plan and  demonstrated an understanding of the instructions. The patient was advised to call back or seek an in-person evaluation if the symptoms worsen or if the condition fails to improve as anticipated.  Follow up plan:  Start fluoxetine  (Prozac ) tablets for anxiety and depression.  Take 1/2 tablet by mouth daily for 1 week, then increase to 1 full tablet daily thereafter.  Please schedule a follow up visit for 4 weeks for follow up of anxiety/depression.  It was a pleasure to see you today!   Jayshun Galentine K Randell Detter, NP    "

## 2024-12-12 NOTE — Patient Instructions (Signed)
 Start fluoxetine  (Prozac ) tablets for anxiety and depression.  Take 1/2 tablet by mouth daily for 1 week, then increase to 1 full tablet daily thereafter.  Please schedule a follow up visit for 4 weeks for follow up of anxiety/depression.  It was a pleasure to see you today!
